# Patient Record
Sex: Male | Born: 1945 | Race: White | Hispanic: No | Marital: Married | Smoking: Former smoker
Health system: Southern US, Community
[De-identification: ages and names within clinical notes are randomized; demographics above are authoritative.]

## PROBLEM LIST (undated history)

## (undated) DIAGNOSIS — G453 Amaurosis fugax: Secondary | ICD-10-CM

## (undated) DIAGNOSIS — H9190 Unspecified hearing loss, unspecified ear: Secondary | ICD-10-CM

## (undated) DIAGNOSIS — Z9889 Other specified postprocedural states: Secondary | ICD-10-CM

## (undated) DIAGNOSIS — Z923 Personal history of irradiation: Secondary | ICD-10-CM

## (undated) DIAGNOSIS — M1990 Unspecified osteoarthritis, unspecified site: Secondary | ICD-10-CM

## (undated) DIAGNOSIS — C801 Malignant (primary) neoplasm, unspecified: Secondary | ICD-10-CM

## (undated) DIAGNOSIS — IMO0002 Reserved for concepts with insufficient information to code with codable children: Secondary | ICD-10-CM

## (undated) DIAGNOSIS — K219 Gastro-esophageal reflux disease without esophagitis: Secondary | ICD-10-CM

## (undated) DIAGNOSIS — F419 Anxiety disorder, unspecified: Secondary | ICD-10-CM

## (undated) DIAGNOSIS — Z85828 Personal history of other malignant neoplasm of skin: Secondary | ICD-10-CM

## (undated) DIAGNOSIS — IMO0001 Reserved for inherently not codable concepts without codable children: Secondary | ICD-10-CM

## (undated) DIAGNOSIS — H269 Unspecified cataract: Secondary | ICD-10-CM

## (undated) DIAGNOSIS — G4730 Sleep apnea, unspecified: Secondary | ICD-10-CM

## (undated) DIAGNOSIS — T7840XA Allergy, unspecified, initial encounter: Secondary | ICD-10-CM

## (undated) DIAGNOSIS — G709 Myoneural disorder, unspecified: Secondary | ICD-10-CM

## (undated) DIAGNOSIS — E785 Hyperlipidemia, unspecified: Secondary | ICD-10-CM

## (undated) DIAGNOSIS — K635 Polyp of colon: Secondary | ICD-10-CM

## (undated) DIAGNOSIS — I493 Ventricular premature depolarization: Secondary | ICD-10-CM

## (undated) DIAGNOSIS — N400 Enlarged prostate without lower urinary tract symptoms: Secondary | ICD-10-CM

## (undated) DIAGNOSIS — R519 Headache, unspecified: Secondary | ICD-10-CM

## (undated) HISTORY — PX: RADIAL KERATOTOMY: SHX217

## (undated) HISTORY — DX: Reserved for inherently not codable concepts without codable children: IMO0001

## (undated) HISTORY — PX: TONSILLECTOMY: SUR1361

## (undated) HISTORY — PX: CERVICAL SPINE SURGERY: SHX589

## (undated) HISTORY — PX: EYE SURGERY: SHX253

## (undated) HISTORY — PX: FINGER SURGERY: SHX640

## (undated) HISTORY — DX: Anxiety disorder, unspecified: F41.9

## (undated) HISTORY — PX: MOHS SURGERY: SUR867

## (undated) HISTORY — DX: Malignant (primary) neoplasm, unspecified: C80.1

## (undated) HISTORY — DX: Polyp of colon: K63.5

## (undated) HISTORY — DX: Hyperlipidemia, unspecified: E78.5

## (undated) HISTORY — DX: Allergy, unspecified, initial encounter: T78.40XA

## (undated) HISTORY — DX: Gastro-esophageal reflux disease without esophagitis: K21.9

## (undated) HISTORY — PX: OTHER SURGICAL HISTORY: SHX169

## (undated) HISTORY — DX: Personal history of irradiation: Z92.3

## (undated) HISTORY — DX: Reserved for concepts with insufficient information to code with codable children: IMO0002

## (undated) HISTORY — PX: COLONOSCOPY W/ BIOPSIES AND POLYPECTOMY: SHX1376

## (undated) HISTORY — PX: NASAL SINUS SURGERY: SHX719

---

## 1989-06-20 HISTORY — PX: NASAL SINUS SURGERY: SHX719

## 2001-10-20 DIAGNOSIS — K635 Polyp of colon: Secondary | ICD-10-CM

## 2001-10-20 HISTORY — DX: Polyp of colon: K63.5

## 2001-11-04 ENCOUNTER — Encounter (INDEPENDENT_AMBULATORY_CARE_PROVIDER_SITE_OTHER): Payer: Self-pay | Admitting: *Deleted

## 2001-11-04 ENCOUNTER — Ambulatory Visit (HOSPITAL_COMMUNITY): Admission: RE | Admit: 2001-11-04 | Discharge: 2001-11-04 | Payer: Self-pay | Admitting: Gastroenterology

## 2005-03-20 HISTORY — PX: INGUINAL HERNIA REPAIR: SHX194

## 2005-04-01 ENCOUNTER — Ambulatory Visit (HOSPITAL_COMMUNITY): Admission: RE | Admit: 2005-04-01 | Discharge: 2005-04-01 | Payer: Self-pay | Admitting: Surgery

## 2005-04-01 ENCOUNTER — Ambulatory Visit (HOSPITAL_BASED_OUTPATIENT_CLINIC_OR_DEPARTMENT_OTHER): Admission: RE | Admit: 2005-04-01 | Discharge: 2005-04-01 | Payer: Self-pay | Admitting: Surgery

## 2008-06-23 DIAGNOSIS — C320 Malignant neoplasm of glottis: Secondary | ICD-10-CM | POA: Insufficient documentation

## 2008-06-23 DIAGNOSIS — C801 Malignant (primary) neoplasm, unspecified: Secondary | ICD-10-CM

## 2008-06-23 HISTORY — DX: Malignant (primary) neoplasm, unspecified: C80.1

## 2008-07-12 ENCOUNTER — Ambulatory Visit: Payer: Self-pay | Admitting: Oncology

## 2008-07-14 LAB — COMPREHENSIVE METABOLIC PANEL
ALT: 14 U/L (ref 0–53)
AST: 16 U/L (ref 0–37)
Albumin: 4.1 g/dL (ref 3.5–5.2)
Alkaline Phosphatase: 63 U/L (ref 39–117)
BUN: 10 mg/dL (ref 6–23)
CO2: 23 mEq/L (ref 19–32)
Calcium: 8.8 mg/dL (ref 8.4–10.5)
Chloride: 106 mEq/L (ref 96–112)
Creatinine, Ser: 1.03 mg/dL (ref 0.40–1.50)
Glucose, Bld: 118 mg/dL — ABNORMAL HIGH (ref 70–99)
Potassium: 3.8 mEq/L (ref 3.5–5.3)
Sodium: 137 mEq/L (ref 135–145)
Total Bilirubin: 0.5 mg/dL (ref 0.3–1.2)
Total Protein: 6.2 g/dL (ref 6.0–8.3)

## 2008-07-14 LAB — CBC WITH DIFFERENTIAL/PLATELET
BASO%: 0.6 % (ref 0.0–2.0)
Basophils Absolute: 0 10*3/uL (ref 0.0–0.1)
EOS%: 1.5 % (ref 0.0–7.0)
Eosinophils Absolute: 0.1 10*3/uL (ref 0.0–0.5)
HCT: 42.2 % (ref 38.7–49.9)
HGB: 14.7 g/dL (ref 13.0–17.1)
LYMPH%: 38.3 % (ref 14.0–48.0)
MCH: 31.5 pg (ref 28.0–33.4)
MCHC: 34.8 g/dL (ref 32.0–35.9)
MCV: 90.4 fL (ref 81.6–98.0)
MONO#: 0.5 10*3/uL (ref 0.1–0.9)
MONO%: 8.6 % (ref 0.0–13.0)
NEUT#: 2.9 10*3/uL (ref 1.5–6.5)
NEUT%: 51 % (ref 40.0–75.0)
Platelets: 189 10*3/uL (ref 145–400)
RBC: 4.67 10*6/uL (ref 4.20–5.71)
RDW: 13 % (ref 11.2–14.6)
WBC: 5.6 10*3/uL (ref 4.0–10.0)
lymph#: 2.2 10*3/uL (ref 0.9–3.3)

## 2008-07-14 LAB — LACTATE DEHYDROGENASE: LDH: 149 U/L (ref 94–250)

## 2008-07-21 ENCOUNTER — Ambulatory Visit: Admission: RE | Admit: 2008-07-21 | Discharge: 2008-09-26 | Payer: Self-pay | Admitting: Radiation Oncology

## 2008-07-24 ENCOUNTER — Ambulatory Visit (HOSPITAL_COMMUNITY): Admission: RE | Admit: 2008-07-24 | Discharge: 2008-07-24 | Payer: Self-pay | Admitting: Radiation Oncology

## 2008-07-24 LAB — BUN: BUN: 8 mg/dL (ref 6–23)

## 2008-07-24 LAB — CREATININE, SERUM: Creatinine, Ser: 0.8 mg/dL (ref 0.40–1.50)

## 2008-07-27 ENCOUNTER — Ambulatory Visit (HOSPITAL_COMMUNITY): Admission: RE | Admit: 2008-07-27 | Discharge: 2008-07-27 | Payer: Self-pay | Admitting: Radiation Oncology

## 2008-08-08 LAB — CBC WITH DIFFERENTIAL/PLATELET
BASO%: 0.9 % (ref 0.0–2.0)
Basophils Absolute: 0.1 10*3/uL (ref 0.0–0.1)
EOS%: 1.1 % (ref 0.0–7.0)
Eosinophils Absolute: 0.1 10*3/uL (ref 0.0–0.5)
HCT: 45.2 % (ref 38.7–49.9)
HGB: 15.7 g/dL (ref 13.0–17.1)
LYMPH%: 34.8 % (ref 14.0–48.0)
MCH: 31.4 pg (ref 28.0–33.4)
MCHC: 34.7 g/dL (ref 32.0–35.9)
MCV: 90.5 fL (ref 81.6–98.0)
MONO#: 0.6 10*3/uL (ref 0.1–0.9)
MONO%: 8.1 % (ref 0.0–13.0)
NEUT#: 3.7 10*3/uL (ref 1.5–6.5)
NEUT%: 55.1 % (ref 40.0–75.0)
Platelets: 196 10*3/uL (ref 145–400)
RBC: 5 10*6/uL (ref 4.20–5.71)
RDW: 12.9 % (ref 11.2–14.6)
WBC: 6.8 10*3/uL (ref 4.0–10.0)
lymph#: 2.4 10*3/uL (ref 0.9–3.3)

## 2008-08-08 LAB — COMPREHENSIVE METABOLIC PANEL
ALT: 17 U/L (ref 0–53)
AST: 16 U/L (ref 0–37)
Albumin: 4.2 g/dL (ref 3.5–5.2)
Alkaline Phosphatase: 69 U/L (ref 39–117)
BUN: 9 mg/dL (ref 6–23)
CO2: 23 mEq/L (ref 19–32)
Calcium: 9.6 mg/dL (ref 8.4–10.5)
Chloride: 107 mEq/L (ref 96–112)
Creatinine, Ser: 0.89 mg/dL (ref 0.40–1.50)
Glucose, Bld: 109 mg/dL — ABNORMAL HIGH (ref 70–99)
Potassium: 3.8 mEq/L (ref 3.5–5.3)
Sodium: 140 mEq/L (ref 135–145)
Total Bilirubin: 0.8 mg/dL (ref 0.3–1.2)
Total Protein: 6.7 g/dL (ref 6.0–8.3)

## 2008-08-08 LAB — LACTATE DEHYDROGENASE: LDH: 142 U/L (ref 94–250)

## 2009-02-16 ENCOUNTER — Encounter: Admission: RE | Admit: 2009-02-16 | Discharge: 2009-02-16 | Payer: Self-pay | Admitting: Chiropractic Medicine

## 2009-04-11 ENCOUNTER — Encounter: Admission: RE | Admit: 2009-04-11 | Discharge: 2009-04-11 | Payer: Self-pay | Admitting: Neurological Surgery

## 2009-06-14 ENCOUNTER — Ambulatory Visit (HOSPITAL_COMMUNITY): Admission: RE | Admit: 2009-06-14 | Discharge: 2009-06-14 | Payer: Self-pay | Admitting: Neurological Surgery

## 2009-07-10 ENCOUNTER — Encounter: Admission: RE | Admit: 2009-07-10 | Discharge: 2009-07-10 | Payer: Self-pay | Admitting: Neurological Surgery

## 2009-08-27 ENCOUNTER — Encounter: Admission: RE | Admit: 2009-08-27 | Discharge: 2009-08-27 | Payer: Self-pay | Admitting: Neurological Surgery

## 2009-10-03 ENCOUNTER — Ambulatory Visit: Admission: RE | Admit: 2009-10-03 | Discharge: 2009-10-03 | Payer: Self-pay | Admitting: Radiation Oncology

## 2009-10-03 LAB — TSH: TSH: 1.565 u[IU]/mL (ref 0.350–4.500)

## 2010-10-03 ENCOUNTER — Ambulatory Visit
Admission: RE | Admit: 2010-10-03 | Discharge: 2010-10-03 | Payer: Self-pay | Source: Home / Self Care | Attending: Radiation Oncology | Admitting: Radiation Oncology

## 2010-10-03 LAB — TSH: TSH: 1.392 u[IU]/mL (ref 0.350–4.500)

## 2011-01-25 LAB — BASIC METABOLIC PANEL
BUN: 8 mg/dL (ref 6–23)
CO2: 31 mEq/L (ref 19–32)
Calcium: 9.2 mg/dL (ref 8.4–10.5)
Chloride: 104 mEq/L (ref 96–112)
Creatinine, Ser: 0.81 mg/dL (ref 0.4–1.5)
GFR calc Af Amer: >60 mL/min (ref >60)
GFR calc non Af Amer: >60 mL/min (ref >60)
Glucose, Bld: 105 mg/dL — ABNORMAL HIGH (ref 70–99)
Potassium: 4.1 mEq/L (ref 3.5–5.1)
Sodium: 140 mEq/L (ref 135–145)

## 2011-01-25 LAB — CBC
HCT: 43.7 % (ref 39.0–52.0)
Hemoglobin: 14.9 g/dL (ref 13.0–17.0)
MCHC: 34 g/dL (ref 30.0–36.0)
MCV: 92.8 fL (ref 78.0–100.0)
Platelets: 168 10*3/uL (ref 150–400)
RBC: 4.72 MIL/uL (ref 4.22–5.81)
RDW: 13.1 % (ref 11.5–15.5)
WBC: 6.1 10*3/uL (ref 4.0–10.5)

## 2011-01-25 LAB — PROTIME-INR
INR: 1 (ref 0.00–1.49)
Prothrombin Time: 13.3 seconds (ref 11.6–15.2)

## 2011-01-25 LAB — DIFFERENTIAL
Basophils Absolute: 0 10*3/uL (ref 0.0–0.1)
Basophils Relative: 1 % (ref 0–1)
Eosinophils Absolute: 0.1 10*3/uL (ref 0.0–0.7)
Eosinophils Relative: 1 % (ref 0–5)
Lymphocytes Relative: 31 % (ref 12–46)
Lymphs Abs: 1.9 10*3/uL (ref 0.7–4.0)
Monocytes Absolute: 0.7 10*3/uL (ref 0.1–1.0)
Monocytes Relative: 12 % (ref 3–12)
Neutro Abs: 3.3 10*3/uL (ref 1.7–7.7)
Neutrophils Relative %: 55 % (ref 43–77)

## 2011-01-25 LAB — APTT: aPTT: 27 seconds (ref 24–37)

## 2011-03-04 NOTE — Op Note (Signed)
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   PROCEDURES:  1. Decompressive anterior cervical diskectomy C6-7.  2. Anterior cervical arthrodesis C6-7 utilizing a 7-mm PEEK interbody      cage packed with local autograft and Actifuse putty.  3. Anterior cervical plating C6-7 utilizing a 25-mm Atlantis Venture      plate.   SURGEON:  Tia Alert, MD   ASSISTANT:  Reinaldo Meeker, MD   ANESTHESIA:  General endotracheal.   COMPLICATIONS:  None apparent.   INDICATIONS FOR PROCEDURE:  Mr. Bertino is a 65 year old gentleman who  presented with neck and left arm pain.  He had an MRI, which showed a  large disk herniation at C6-7 with cord compression and mild signal  change in the cord.  I recommended decompressive anterior cervical  diskectomy and fusion with plating at C6-7.  He understood the risks,  benefits, expected outcome, and wished to proceed.   DESCRIPTION OF PROCEDURE:  The patient was taken to the operating room  and after induction of adequate generalized endotracheal anesthesia, he  was placed in a supine position on the operating room table.  His right  anterior cervical region was prepped with DuraPrep and then draped in  the usual sterile fashion.  A 5 mL of local anesthesia was injected and  a transverse incision was made to the right of midline and carried down  to the platysma.  The platysma was elevated, opened and undermined with  Metzenbaum scissors.  I then dissected  in a plane medial to the  sternocleidomastoid muscle and internal carotid artery and lateral to  the trachea and esophagus to expose C6-7.  Intraoperative fluoroscopy  confirmed my level and then the longus colli muscles were taken down and  the Shadow-Line retractors were placed under this to expose.  The  annulus was incised and initial diskectomies done with pituitary  rongeurs and curved curettes.  We then used the high-speed drill to  drill the endplates down to the level of the posterior longitudinal  ligament.  The drill shavings were saved in a Mucus Trap to use for  later arthrodesis.  The operating microscope was brought into the field  and we undercut the vertebral bodies of C6 and C7, opened the posterior  longitudinal ligament and removed and undercutting the bodies even  further.  There was a large herniated disk at C6-7 on the left, this was  removed.  There was a piece that went under the C6 vertebral body.  This  was swept out with a nerve hook.  I continued to decompress  laterally  until I identified the C7 nerve root and the pedicle on the left,  marched along the pedicle and decompressed the C7 nerve root distally  into its foramen.  I then palpated circumferentially with a nerve hook  to assure adequate decompression.  The dura was full all the way across.  We can see the cord pulsatile through the dura.  We felt like we had an  excellent decompression and the disk was removed.  We palpated once  again to assure this using a nerve hook.  We then measured the  interspace to be 6 mm; therefore, we got a 7-mm PEEK interbody cage to  get a little bit of distraction, and we packed this with local autograft  and Actifuse putty and tapped this into position at C6-7.  We then used  a 25-mm Atlantis Venture plate and placed two 13-mm variable angle  screws in the bodies of C6 and C7.  These locked into plate by locking  mechanism within the plate.  We then irrigated with saline  solution  containing bacitracin, dried all bleeding points.  Once meticulous  hemostasis was achieved, I closed the platysma with 3-0 Vicryl, closed  the subcuticular tissue with 3-0 Vicryl, and closed the skin with  benzoin and Steri-Strips.  The drapes were removed.  A sterile dressing  was applied.  The patient was awakened from general anesthesia and  transported to the recovery room in stable condition.  At the end of  procedure, all sponge, needle, and instrument counts were correct.      Tia Alert, MD  Electronically Signed     DSJ/MEDQ  D:  06/14/2009  T:  06/15/2009  Job:  [id number]

## 2011-03-07 NOTE — Procedures (Signed)
[place]  Patient:    JOESEPH, VERVILLE Visit Number: [id number] MRN: [id number]          Service Type: END Location: ENDO Attending Physician:  Charna Elizabeth Dictated by:   Anselmo Rod, M.D. Proc. Date: 11/04/01 Admit Date:  11/04/2001   CC:         Lilly Cove, M.D.   Procedure Report  DATE OF BIRTH:  [birth year]  REFERRING PHYSICIAN:  Lilly Cove, M.D.  PROCEDURE PERFORMED:  Colonoscopy with snare polypectomy x 2.  ENDOSCOPIST:  Anselmo Rod, M.D.  INSTRUMENT USED:  Olympus video colonoscope.  INDICATIONS FOR PROCEDURE:  Blood in stool in a 65 year old white male.  Rule out colonic polyps, masses, hemorrhoids, etc.  PREPROCEDURE PREPARATION:  Informed consent was procured from the patient. The patient was fasted for eight hours prior to the procedure and prepped with a bottle of magnesium citrate and a gallon of NuLytely the night prior to the procedure.  PREPROCEDURE PHYSICAL:  The patient had stable vital signs.  Neck supple. Chest clear to auscultation.  S1, S2 regular.  Abdomen soft with normal abdominal bowel sounds.  DESCRIPTION OF PROCEDURE:  The patient was placed in the left lateral decubitus position and sedated with 75 mg of Demerol and 6 mg of Versed intravenously.  Once the patient was adequately sedated and maintained on low-flow oxygen and continuous cardiac monitoring, the Olympus video colonoscope was advanced from the rectum to the cecum without difficulty.  The patient had a fairly good prep.  A 6 to 7 mm broad based polyp was snared from 15 cm.  Another smaller sessile polyp was snared from 15 cm as well.  Both the polyps were retrieved.  There was evidence of small internal hemorrhoids on retroflexion.  The rest of the colonic mucosa including the cecum, right colon, transverse colon appeared normal and without lesions.  IMPRESSION: 1. Small nonbleeding internal hemorrhoids. 2. Early  left-sided diverticulosis. 3. Two polyps snared from 15 cm. 4. Normal-appearing transverse and right colon.  RECOMMENDATIONS: 1. Await pathology results. 2. Avoid all nonsteroidals including aspirin over the next three weeks. 3. High fiber diet. 4. Repeat colorectal cancer screening depending on the pathology results.Dictated by:   Anselmo Rod, M.D. Attending Physician:  Charna Elizabeth DD:  11/04/01 TD:  11/04/01 Job: [id number]

## 2011-03-07 NOTE — Op Note (Signed)
NAMEJASYAH, THEURER              ACCOUNT NO.:  [id number]   MEDICAL RECORD NO.:  [id number]          PATIENT TYPE:  AMB   LOCATION:  DSC                          FACILITY:  [facility]   PHYSICIAN:  Thornton Park. Daphine Deutscher, MD  DATE OF BIRTH:  September 05, 1946   DATE OF PROCEDURE:  04/01/2005  DATE OF DISCHARGE:                                 OPERATIVE REPORT   PREOPERATIVE DIAGNOSIS:  Left inguinal hernia.   POSTOPERATIVE DIAGNOSIS:  Left indirect inguinal hernia.   PROCEDURE:  Left inguinal herniorrhaphy with mesh.   SURGEON:  Thornton Park. Daphine Deutscher, M.D.   ANESTHESIA:  General endotracheal anesthesia.   DESCRIPTION OF PROCEDURE:  Mr. Beamer is a 65 year old gentleman with a  newly diagnosed left inguinal hernia.  Informed consent was obtained  regarding recurrence, nerve pain, and numbness.  He was given a booklet on  the repair with these and other complications, risks, and information about  the procedure explained to him.  After painting with Betadine, a small  oblique incision was made and carried down over the inguinal canal.  I  opened the inguinal canal with Metzenbaum scissors avoiding the ilioinguinal  nerve which I separated and placed up with the medial flap of external  oblique.  I mobilized the cord and found an indirect hernia sac with fat and  another lipoma of the cord which I mobilized both from the cord structures  and then I reduced up inside the abdomen.  I closed the ring with two  sutures of 2-0 Prolene.  A pre-cut piece of mesh was sutured to the inguinal  ligament with running 2-0 Prolene.  I brought it around the cord structures  and then sutured it medially with a running 2-0 Prolene and then sutured it  to itself with a single horizontal mattress suture of 2-0 Prolene.   The external oblique was reapproximated with interrupted 2-0 Vicryl.  4-0  Vicryl was used in the subcutaneous tissue.  I injected the area with 0.25%  Marcaine.  I closed it in layers with 4-0 Vicryl  with Benzoin and Steri-  Strips on the skin.  The patient was given Tylox for pain and will be  followed up in the office in three weeks.       MBM/MEDQ  D:  04/01/2005  T:  04/01/2005  Job:  [id number]   cc:   Gaspar Garbe, M.D.  [address]  [place]  [place] [address]  Fax: [phone]   Lucrezia Starch. Earlene Plater, M.D.  [address]  [place]  [place] [address]  Fax: [phone]

## 2011-04-24 ENCOUNTER — Ambulatory Visit
Admission: RE | Admit: 2011-04-24 | Discharge: 2011-04-24 | Disposition: A | Discharge status: Home / Self Care | Payer: Medicare Other | Source: Ambulatory Visit | Attending: Radiation Oncology | Admitting: Radiation Oncology

## 2011-07-22 LAB — GLUCOSE, CAPILLARY: Glucose-Capillary: 93

## 2011-10-29 ENCOUNTER — Ambulatory Visit: Payer: Medicare Other | Admitting: Radiation Oncology

## 2011-11-04 ENCOUNTER — Encounter: Payer: Self-pay | Admitting: *Deleted

## 2011-11-04 DIAGNOSIS — E785 Hyperlipidemia, unspecified: Secondary | ICD-10-CM | POA: Insufficient documentation

## 2011-11-04 DIAGNOSIS — K219 Gastro-esophageal reflux disease without esophagitis: Secondary | ICD-10-CM | POA: Insufficient documentation

## 2011-11-06 ENCOUNTER — Ambulatory Visit
Admission: RE | Admit: 2011-11-06 | Discharge: 2011-11-06 | Disposition: A | Discharge status: Home / Self Care | Payer: Medicare Other | Source: Ambulatory Visit | Attending: Radiation Oncology | Admitting: Radiation Oncology

## 2011-11-06 ENCOUNTER — Ambulatory Visit (HOSPITAL_COMMUNITY)
Admission: RE | Admit: 2011-11-06 | Discharge: 2011-11-06 | Disposition: A | Discharge status: Home / Self Care | Payer: Medicare Other | Source: Ambulatory Visit | Attending: Radiation Oncology | Admitting: Radiation Oncology

## 2011-11-06 ENCOUNTER — Encounter: Payer: Self-pay | Admitting: Radiation Oncology

## 2011-11-06 DIAGNOSIS — T66XXXA Radiation sickness, unspecified, initial encounter: Secondary | ICD-10-CM

## 2011-11-06 DIAGNOSIS — C320 Malignant neoplasm of glottis: Secondary | ICD-10-CM | POA: Insufficient documentation

## 2011-11-06 DIAGNOSIS — Y842 Radiological procedure and radiotherapy as the cause of abnormal reaction of the patient, or of later complication, without mention of misadventure at the time of the procedure: Secondary | ICD-10-CM | POA: Insufficient documentation

## 2011-11-06 DIAGNOSIS — C801 Malignant (primary) neoplasm, unspecified: Secondary | ICD-10-CM

## 2011-11-06 DIAGNOSIS — C329 Malignant neoplasm of larynx, unspecified: Secondary | ICD-10-CM | POA: Insufficient documentation

## 2011-11-06 LAB — TSH: TSH: 2.418 u[IU]/mL (ref 0.350–4.500)

## 2011-11-06 NOTE — Progress Notes (Signed)
Patient presented to the clinic today accompanied by his wife for a follow up appointment with Dr. Kathrynn Running. Patient is alert and oriented to person, place, and time. No distress noted. Steady gait noted. Pleasant affect noted. Patient denies pain at this time. Patient has no complaints. Patient seen by Dr. Kathrynn Running. Directed patient to lab to have his TSH drawn then, onto radiology for a chest xray. Patient and his wife verbalized understanding.

## 2011-11-07 ENCOUNTER — Encounter: Payer: Self-pay | Admitting: Radiation Oncology

## 2011-11-07 NOTE — Progress Notes (Signed)
  Radiation Oncology         [phone] ________________________________  Name: Peter Drake MRN: [id number]  Date: 11/07/2011  DOB: 02/13/1946       Follow-Up Visit Note  CC: No primary provider on file.  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 . . . . . . . The patient returns today for routine follow-up.  He does suffer with a gravelly voice in the morning. Otherwise, he notes no voice changes. He denies otalgia. He has been experiencing some challenges with earwax as well as otitis media and he follows with Dr. Narda Bonds for this. The patient's most recent TSH level was drawn in December 2011 and he has not had recent chest x-ray.                              ALLERGIES:  is allergic to penicillins.  Meds. . . . . . . . . . . . Current Outpatient Prescriptions  Medication Sig Dispense Refill  . simvastatin (ZOCOR) 20 MG tablet Take 20 mg by mouth daily.      . Tamsulosin HCl (FLOMAX) 0.4 MG CAPS Take by mouth.      Marland Kitchen aspirin 325 MG EC tablet Take 325 mg by mouth daily.      Marland Kitchen esomeprazole (NEXIUM) 40 MG capsule Take 40 mg by mouth daily before breakfast.        Physical Findings. . .  oral temperature is 98.1 F (36.7 C). His blood pressure is 130/87 and his pulse is 65. His respiration is 18. .  The patient is in no acute distress today. He is alert and oriented. His neck is free of any appreciable lymphadenopathy. The oral cavity contains good dentition with no mucosal abnormalities. Indirect mirror exam elicit a gag reflex. Following local anesthesia comment are clear exam provided some visualization of the supraglottic larynx and vocal cords. No apparent evidence for persistent or recurrent disease was noted.  Lab Findings . . . . .  Lab Results  Component Value Date   WBC 6.1 06/12/2009   HGB 14.9 06/12/2009   HCT 43.7  06/12/2009   MCV 92.8 06/12/2009   PLT 168 06/12/2009    Impression . . . . . . . The patient is recovering from the effects of radiation.  He is no evidence of recurrent disease. He is due for her annual TSH and chest imaging.  Plan . . . . . . . . . . . Marland Kitchen the patient will undergo TSH and chest x-ray today. He'll return for routine followup in one year  _____________________________________  Artist Pais. Kathrynn Running, M.D.    Addendum:  Chest x-ray and TSH are normal  X-Ray Findings. . . . Dg Chest 2 View  11/06/2011  *RADIOLOGY REPORT*  Clinical Data: Laryngeal cancer and restaging.  CHEST - 2 VIEW  Comparison: 06/12/2009  Findings: The heart size and mediastinal contours are within normal limits.  Both lungs are clear.  The visualized skeletal structures are unremarkable.  IMPRESSION: Negative exam.  Original Report Authenticated By: Rosealee Albee, M.D.   TSH  Date Value Range Status  11/06/2011 2.418  0.350-4.500 (uIU/mL) Final

## 2011-11-07 NOTE — Progress Notes (Signed)
Quick Note:  Please call patient with normal result.  Thanks. MM ______ 

## 2011-11-12 ENCOUNTER — Telehealth: Payer: Self-pay | Admitting: *Deleted

## 2011-11-12 NOTE — Telephone Encounter (Signed)
Pt called this am, informed of TSH normal and Cxray normal, pt thanked staff for the news and will see MD 6 months 8:31 AM

## 2012-03-13 ENCOUNTER — Encounter (HOSPITAL_COMMUNITY): Payer: Self-pay | Admitting: *Deleted

## 2012-03-13 ENCOUNTER — Emergency Department (HOSPITAL_COMMUNITY)
Admission: EM | Admit: 2012-03-13 | Discharge: 2012-03-13 | Disposition: A | Discharge status: Home / Self Care | Payer: Medicare Other | Attending: Emergency Medicine | Admitting: Emergency Medicine

## 2012-03-13 DIAGNOSIS — Z23 Encounter for immunization: Secondary | ICD-10-CM | POA: Insufficient documentation

## 2012-03-13 DIAGNOSIS — K219 Gastro-esophageal reflux disease without esophagitis: Secondary | ICD-10-CM | POA: Insufficient documentation

## 2012-03-13 DIAGNOSIS — E785 Hyperlipidemia, unspecified: Secondary | ICD-10-CM | POA: Insufficient documentation

## 2012-03-13 DIAGNOSIS — W268XXA Contact with other sharp object(s), not elsewhere classified, initial encounter: Secondary | ICD-10-CM | POA: Insufficient documentation

## 2012-03-13 DIAGNOSIS — S51809A Unspecified open wound of unspecified forearm, initial encounter: Secondary | ICD-10-CM | POA: Insufficient documentation

## 2012-03-13 DIAGNOSIS — Z87891 Personal history of nicotine dependence: Secondary | ICD-10-CM | POA: Insufficient documentation

## 2012-03-13 DIAGNOSIS — Z8521 Personal history of malignant neoplasm of larynx: Secondary | ICD-10-CM | POA: Insufficient documentation

## 2012-03-13 DIAGNOSIS — S51831A Puncture wound without foreign body of right forearm, initial encounter: Secondary | ICD-10-CM

## 2012-03-13 MED ORDER — CEPHALEXIN 250 MG PO CAPS
500.0000 mg | ORAL_CAPSULE | Freq: Once | ORAL | Status: AC
  Administered 2012-03-13: 500 mg via ORAL
  Filled 2012-03-13: qty 2

## 2012-03-13 MED ORDER — TETANUS-DIPHTH-ACELL PERTUSSIS 5-2.5-18.5 LF-MCG/0.5 IM SUSP
INTRAMUSCULAR | Status: AC
  Filled 2012-03-13: qty 0.5

## 2012-03-13 MED ORDER — TETANUS-DIPHTH-ACELL PERTUSSIS 5-2-15.5 LF-MCG/0.5 IM SUSP: 0.5000 mL | Freq: Once | INTRAMUSCULAR | Status: DC

## 2012-03-13 MED ORDER — CEPHALEXIN 500 MG PO CAPS: 500.0000 mg | ORAL_CAPSULE | Freq: Four times a day (QID) | ORAL | Status: AC

## 2012-03-13 MED ORDER — TETANUS-DIPHTH-ACELL PERTUSSIS 5-2.5-18.5 LF-MCG/0.5 IM SUSP
0.5000 mL | Freq: Once | INTRAMUSCULAR | Status: AC
  Administered 2012-03-13: 0.5 mL via INTRAMUSCULAR

## 2012-03-13 NOTE — ED Provider Notes (Signed)
History   This chart was scribed for Lyanne Co, MD by Melba Coon. The patient was seen in room STRE5/STRE5 and the patient's care was started at 2:56PM.    CSN: [id number]  Arrival date & time 03/13/12  1403   First MD Initiated Contact with Patient 03/13/12 1452      Chief Complaint  Patient presents with  . Arm Injury    (Consider location/radiation/quality/duration/timing/severity/associated sxs/prior treatment) HPI Peter Drake is a 66 y.o. male who presents to the Emergency Department complaining of constant, moderate to severe right forearm pain pertaining to a laceration with an onset today. Pt was helping his friend cut a tree limb; when pt helped his friend turn over the limb, a piece wood became lodged into his forearm. No pain meds have been taken at home. No HA, fever, neck pain, sore throat, rash, back pain, CP, SOB, abd pain, n/v/d, dysuria, or extremity edema, weakness, numbness, or tingling. Allergic to penicillins. No other pertinent medical symptoms.   Past Medical History  Diagnosis Date  . 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 Dyslipidemia   . History of radiation therapy 08/04/08-09/20/08    larynx  . GERD (gastroesophageal reflux disease)   . Allergy     pcn  . Colorectal polyps 1/03  . Anxiety     Past Surgical History  Procedure Date  . Tonsillectomy     as a child  . Left knee arthoscopy     x 2  . Inguinal hernia repair  03/2005    left   . Finger surgery     right fifth  . Radial keratotomy     20 years ago/ bi lateral  . Nasal sinus surgery 1990's  . Basal cell excisions     by dr. hall  . Cervical spine surgery     Family History  Problem Relation Age of Onset  . Rectal cancer Sister   . Melanoma Daughter 24    treated    History  Substance Use Topics  . Smoking status: Former Smoker -- 15 years    Types: Cigarettes  . Smokeless tobacco: Former Neurosurgeon   Comment: quit smoking 1980,then smokless tobaco until  1996  . 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   Allergies  Penicillins  Home Medications   Current Outpatient Rx  Name Route Sig Dispense Refill  . ASPIRIN 325 MG PO TBEC Oral Take 325 mg by mouth daily as needed. pain    . MOMETASONE FUROATE 50 MCG/ACT NA SUSP Nasal Place 2 sprays into the nose daily as needed. congestion    . SIMVASTATIN 20 MG PO TABS Oral Take 20 mg by mouth daily.    Marland Kitchen TAMSULOSIN HCL 0.4 MG PO CAPS Oral Take 0.4 mg by mouth daily.       BP 106/79  Pulse 72  Temp(Src) 98.1 F (36.7 C) (Oral)  Resp 20  SpO2 99%  Physical Exam  Nursing note and vitals reviewed. Constitutional: He is oriented to person, place, and time. He appears well-developed and well-nourished. No distress.  HENT:  Head: Normocephalic and atraumatic.  Eyes: EOM are normal.  Neck: Neck supple. No tracheal deviation present.  Cardiovascular: Normal rate.   Pulmonary/Chest: Effort normal. No respiratory distress.  Musculoskeletal: Normal range  of motion.  Neurological: He is alert and oriented to person, place, and time.  Skin: Skin is warm and dry.       Puncture wound on rt anterior mid forearm with piece of wood stuck into wound  Psychiatric: He has a normal mood and affect. His behavior is normal.    ED Course  FOREIGN BODY REMOVAL Performed by: Lyanne Co Authorized by: Lyanne Co Consent: Verbal consent obtained. Consent given by: patient Patient identity confirmed: verbally with patient Intake: Right anterior forearm. Anesthesia method: None. Local anesthetic: None. Patient cooperative: yes Complexity: simple 1 objects recovered. Objects recovered: Wood fragment Post-procedure assessment: foreign body removed Patient tolerance: Patient tolerated the procedure well with no immediate complications. Comments: Irrigated extensively at  bedside   (including critical care time)  DIAGNOSTIC STUDIES: Oxygen Saturation is 99% on room air, normal by my interpretation.    COORDINATION OF CARE:  2:59PM - EDMD removed small wet fragment from the forearm; pt will need a tetanus shot 3:04PM - EDMD will Rx abx for the pt; tetanus shot given; pt ready for d/c    Labs Reviewed - No data to display No results found.   1. Puncture wound of right forearm       MDM  Foreign body removed.  Infection warnings given.  Tetanus updated.  We'll place the patient on antibiotics.  He understands to return to the emergency department for signs of infection that we discussed  I personally performed the services described in this documentation, which was scribed in my presence. The recorded information has been reviewed and considered.          Lyanne Co, MD 03/13/12 [phone]

## 2012-03-13 NOTE — Discharge Instructions (Signed)
Puncture Wound  A puncture wound is an injury that extends through all layers of the skin and into the tissue beneath the skin (subcutaneous tissue). Puncture wounds become infected easily because germs often enter the body and go beneath the skin during the injury. Having a deep wound with a small entrance point makes it difficult for your caregiver to adequately clean the wound. This is especially true if you have stepped on a nail and it has passed through a dirty shoe or other situations where the wound is obviously contaminated.  CAUSES   Many puncture wounds involve glass, nails, splinters, fish hooks, or other objects that enter the skin (foreign bodies). A puncture wound may also be caused by a human bite or animal bite.  DIAGNOSIS   A puncture wound is usually diagnosed by your history and a physical exam. You may need to have an X-ray or an ultrasound to check for any foreign bodies still in the wound.  TREATMENT    Your caregiver will clean the wound as thoroughly as possible. Depending on the location of the wound, a bandage (dressing) may be applied.   Your caregiver might prescribe antibiotic medicines.   You may need a follow-up visit to check on your wound. Follow all instructions as directed by your caregiver.  HOME CARE INSTRUCTIONS    Change your dressing once per day, or as directed by your caregiver. If the dressing sticks, it may be removed by soaking the area in water.   If your caregiver has given you follow-up instructions, it is very important that you return for a follow-up appointment. Not following up as directed could result in a chronic or permanent injury, pain, and disability.   Only take over-the-counter or prescription medicines for pain, discomfort, or fever as directed by your caregiver.   If you are given antibiotics, take them as directed. Finish them even if you start to feel better.  You may need a tetanus shot if:   You cannot remember when you had your last tetanus  shot.   You have never had a tetanus shot.  If you got a tetanus shot, your arm may swell, get red, and feel warm to the touch. This is common and not a problem. If you need a tetanus shot and you choose not to have one, there is a rare chance of getting tetanus. Sickness from tetanus can be serious.  You may need a rabies shot if an animal bite caused your puncture wound.  SEEK MEDICAL CARE IF:    You have redness, swelling, or increasing pain in the wound.   You have red streaks going away from the wound.   You notice a bad smell coming from the wound or dressing.   You have yellowish-white fluid (pus) coming from the wound.   You are treated with an antibiotic for infection, but the infection is not getting better.   You notice something in the wound, such as rubber from your shoe, cloth, or another object.   You have a fever.   You have severe pain.   You have difficulty breathing.   You feel dizzy or faint.   You cannot stop vomiting.   You lose feeling, develop numbness, or cannot move a limb below the wound.   Your symptoms worsen.  MAKE SURE YOU:   Understand these instructions.   Will watch your condition.   Will get help right away if you are not doing well or get worse.    [employer].

## 2012-03-13 NOTE — ED Notes (Signed)
Patient reports he has a splinter in his right forearm.  He tried to remove the splinter but was not successful.  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

## 2012-04-08 ENCOUNTER — Encounter (INDEPENDENT_AMBULATORY_CARE_PROVIDER_SITE_OTHER): Payer: Self-pay | Admitting: Surgery

## 2012-04-28 ENCOUNTER — Other Ambulatory Visit: Payer: Self-pay | Admitting: Orthopedic Surgery

## 2012-04-28 DIAGNOSIS — M545 Low back pain, unspecified: Secondary | ICD-10-CM

## 2012-04-30 ENCOUNTER — Ambulatory Visit
Admission: RE | Admit: 2012-04-30 | Discharge: 2012-04-30 | Disposition: A | Discharge status: Home / Self Care | Payer: Medicare Other | Source: Ambulatory Visit | Attending: Orthopedic Surgery | Admitting: Orthopedic Surgery

## 2012-04-30 DIAGNOSIS — M545 Low back pain, unspecified: Secondary | ICD-10-CM

## 2012-05-02 ENCOUNTER — Ambulatory Visit
Admission: RE | Admit: 2012-05-02 | Discharge: 2012-05-02 | Disposition: A | Discharge status: Home / Self Care | Payer: Medicare Other | Source: Ambulatory Visit | Attending: Orthopedic Surgery | Admitting: Orthopedic Surgery

## 2012-05-02 DIAGNOSIS — M545 Low back pain, unspecified: Secondary | ICD-10-CM

## 2012-05-13 ENCOUNTER — Ambulatory Visit: Payer: Medicare Other | Admitting: Radiation Oncology

## 2012-05-19 ENCOUNTER — Encounter: Payer: Self-pay | Admitting: Radiation Oncology

## 2012-05-20 ENCOUNTER — Ambulatory Visit
Admission: RE | Admit: 2012-05-20 | Discharge: 2012-05-20 | Disposition: A | Discharge status: Home / Self Care | Payer: Medicare Other | Source: Ambulatory Visit | Attending: Radiation Oncology | Admitting: Radiation Oncology

## 2012-05-20 ENCOUNTER — Telehealth: Payer: Self-pay | Admitting: *Deleted

## 2012-05-20 ENCOUNTER — Encounter: Payer: Self-pay | Admitting: Radiation Oncology

## 2012-05-20 VITALS — BP 147/90 | HR 68 | Temp 98.2°F | Wt 175.8 lb

## 2012-05-20 DIAGNOSIS — C801 Malignant (primary) neoplasm, unspecified: Secondary | ICD-10-CM

## 2012-05-20 NOTE — Progress Notes (Signed)
Here for routine follow up completion of radiation of left true vocal cord December 2009. Most frequent concern with  Lower back disc bulging but not really having pain at present.

## 2012-05-20 NOTE — Telephone Encounter (Signed)
CALLED PATIENT TO INFORM OF FU VISIT FOR 11-25-12- 11:30 AM, SPOKE WITH PATIENT AND HE IS AWARE OF THIS APPT.

## 2012-05-20 NOTE — Progress Notes (Signed)
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  He notes unchanged hoarseness upon awakening in the morning which improves during the course of the day. He denies any other changes recently.                              ALLERGIES:  is allergic to penicillins.  Meds: Current Outpatient Prescriptions  Medication Sig Dispense Refill  . aspirin 325 MG EC tablet Take 325 mg by mouth daily as needed. pain      . mometasone (NASONEX) 50 MCG/ACT nasal spray Place 2 sprays into the nose daily as needed. congestion      . simvastatin (ZOCOR) 20 MG tablet Take 20 mg by mouth daily.      . Tamsulosin HCl (FLOMAX) 0.4 MG CAPS Take 0.4 mg by mouth daily.         Physical Findings: The patient is in no acute distress. Patient is alert and oriented.  weight is 175 lb 12.8 oz (79.742 kg). His temperature is 98.2 F (36.8 C). His blood pressure is 147/90 and his pulse is 68. His oxygen saturation is 98%. . His neck is free of any appreciable lymphadenopathy or significant telangiectasia. The oral cavity contains good dentition with no mucosal irregularities. Indirect mirror exam provides visualization of the posterior oropharynx and larynx. The true vocal cords appear normal. No significant changes.  Lab Findings: TSH in January was normal  Radiographic Findings: Dg Eye Foreign Body  04/30/2012  *RADIOLOGY REPORT*  Clinical Data: MRI metal screening.  ORBITS FOR FOREIGN BODY - 2 VIEW  Comparison: 08/27/2009.  Findings: No radiopaque orbital foreign bodies.   IMPRESSION: Negative.  Original Report Authenticated By: Andreas Newport, M.D.   Mr Lumbar Spine Wo Contrast  05/02/2012  *RADIOLOGY REPORT*  Clinical Data: Low back pain with left leg weakness for 2 weeks. History of vocal cord cancer.  No previous relevant surgery.  MRI LUMBAR SPINE WITHOUT CONTRAST  Technique:  Multiplanar and multiecho pulse sequences of the lumbar spine were obtained without intravenous contrast.  Comparison: PET CT 07/27/2008.  Findings: As correlated with prior study, the patient has five lumbar type vertebral bodies.  The alignment is normal.  There are endplate degenerative changes anteriorly at T12-L1 and L1-L2. There are more diffuse endplate degenerative changes at L5-S1. There is no evidence of acute fracture, pars defect or metastatic disease.  The conus medullaris extends to the T12-L1 level and appears normal. There are no paraspinal abnormalities.  T12-L1:  Mild disc bulging with anterior osteophytes.  No spinal stenosis or nerve root encroachment.  L1-L2:  Tiny left paracentral disc protrusion.  No spinal stenosis or nerve root encroachment.  L2-L3:  There is disc bulging mildly eccentric to the left with mild facet and ligamentous hypertrophy.  Both lateral recesses are mildly narrowed, left greater than right.  The foramina appear sufficiently patent. There is possible extraforaminal left L2 nerve root encroachment without displacement.  L3-L4:  Mild disc bulging with mild facet and  ligamentous hypertrophy.  Both lateral recesses are mildly narrowed.  The foramina are sufficiently patent.  L4-L5:  There is disc space loss with disc bulging and posterior osteophytes.  There is mild facet and ligamentous hypertrophy. There is mild inferior foraminal narrowing bilaterally without definite L4 nerve root encroachment.  The central canal and lateral recesses are adequately patent.  L5-S1:  There is disc space loss with disc bulging and posterior osteophytes.  There is mild facet and  ligamentous hypertrophy. Mild inferior foraminal narrowing is present bilaterally.  The central canal and lateral recesses are adequately patent at  IMPRESSION:  1.  No acute osseous findings, malalignment or evidence of metastatic disease. 2.  Mild spondylosis, greatest at L4-L5 and L5-S1 where there is loss of disc height and posterior osteophyte formation contributing to mild inferior foraminal narrowing bilaterally. No high-grade foraminal stenosis demonstrated. 3.  The lateral recesses are mildly narrowed bilaterally at L2-L3 and L3-L4. Extraforaminal left L2 nerve root encroachment by disc material at L2-L3 is possible.  Original Report Authenticated By: Gerrianne Scale, M.D.    Impression:  The patient currently exhibits no evidence recurrence.  Plan:  The patient will return for routine followup in 6 months. We will recheck his annual TSH level that point to rule out postradiation hypothyroidism. We will also obtain an annual chest imaging chest x-ray or other chest imaging has not been performed prior to that visit.  _____________________________________  Artist Pais. Kathrynn Running, M.D.

## 2012-10-27 ENCOUNTER — Telehealth: Payer: Self-pay | Admitting: *Deleted

## 2012-10-27 NOTE — Telephone Encounter (Signed)
CALLED PATIENT TO ALTER FU VISIT FOR 11-25-12, DUE TO DR. MANNING BEING IN THE OR, RESCHEDULED FOR 12-02-12 AT 10:45 AM , PATIENT'S WIFE AGREED TO APPT.

## 2012-11-12 ENCOUNTER — Encounter (INDEPENDENT_AMBULATORY_CARE_PROVIDER_SITE_OTHER): Payer: Self-pay | Admitting: Surgery

## 2012-11-12 ENCOUNTER — Ambulatory Visit (INDEPENDENT_AMBULATORY_CARE_PROVIDER_SITE_OTHER): Payer: Medicare Other | Admitting: Surgery

## 2012-11-12 VITALS — BP 134/72 | HR 72 | Resp 12 | Ht 70.0 in | Wt 180.0 lb

## 2012-11-12 DIAGNOSIS — R1032 Left lower quadrant pain: Secondary | ICD-10-CM

## 2012-11-12 NOTE — Progress Notes (Signed)
Dayna Ramus 67 y.o.  Body mass index is 25.83 kg/(m^2).  Patient Active Problem List  Diagnosis  . 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 Dyslipidemia  . GERD (gastroesophageal reflux disease)    Allergies  Allergen Reactions  . Penicillins Hives    Past Surgical History  Procedure Date  . Tonsillectomy     as a child  . Left knee arthoscopy     x 2  . Inguinal hernia repair  03/2005    left   . Finger surgery     right fifth  . Radial keratotomy     20 years ago/ bi lateral  . Nasal sinus surgery 1990's  . Basal cell excisions     by dr. hall  . Cervical spine surgery    Gaspar Garbe, MD No diagnosis found.  Mr. Gardiner Coins had a left inguinal hernia repair performed by me in June, 2006. He had a left indirect inguinal hernia which I repaired by tightening his ring with a 2-0 Prolene and then used a piece of mesh on the floor clear.  About 4 weeks ago he was calling some gravel in chart habits and left inguinal discomfort. Not a definite pain and discomfort. On examination I cannot feel a recurrent hernia on either the right side or the left side. I think that this is straining. I advised him to use ibuprofen. I advised him to call in 3-4 weeks it is still there and I would recheck him in likely do a CT scan of his abdomen  The plan is if he gets better he will not call back. He still having pain in 3-4 weeks he'll call back for likely get a CT scan of his pelvis. Matt B. Daphine Deutscher, MD, [facility] [phone] beeper [phone]  11/12/2012 1:48 PM

## 2012-11-12 NOTE — Patient Instructions (Signed)

## 2012-11-25 ENCOUNTER — Ambulatory Visit: Payer: Medicare Other | Admitting: Radiation Oncology

## 2012-12-02 ENCOUNTER — Encounter: Payer: Self-pay | Admitting: Radiation Oncology

## 2012-12-02 ENCOUNTER — Ambulatory Visit
Admission: RE | Admit: 2012-12-02 | Discharge: 2012-12-02 | Disposition: A | Discharge status: Home / Self Care | Payer: Medicare Other | Source: Ambulatory Visit | Attending: Radiation Oncology | Admitting: Radiation Oncology

## 2012-12-02 ENCOUNTER — Ambulatory Visit (HOSPITAL_COMMUNITY)
Admission: RE | Admit: 2012-12-02 | Discharge: 2012-12-02 | Disposition: A | Discharge status: Home / Self Care | Payer: Medicare Other | Source: Ambulatory Visit | Attending: Radiation Oncology | Admitting: Radiation Oncology

## 2012-12-02 VITALS — BP 131/89 | HR 77 | Temp 97.9°F | Resp 18 | Wt 182.3 lb

## 2012-12-02 DIAGNOSIS — C320 Malignant neoplasm of glottis: Secondary | ICD-10-CM | POA: Insufficient documentation

## 2012-12-02 DIAGNOSIS — Z923 Personal history of irradiation: Secondary | ICD-10-CM | POA: Insufficient documentation

## 2012-12-02 DIAGNOSIS — C329 Malignant neoplasm of larynx, unspecified: Secondary | ICD-10-CM | POA: Insufficient documentation

## 2012-12-02 DIAGNOSIS — T66XXXA Radiation sickness, unspecified, initial encounter: Secondary | ICD-10-CM

## 2012-12-02 DIAGNOSIS — Z79899 Other long term (current) drug therapy: Secondary | ICD-10-CM | POA: Insufficient documentation

## 2012-12-02 LAB — TSH: TSH: 2.096 u[IU]/mL (ref 0.350–4.500)

## 2012-12-02 MED ORDER — LARYNGOSCOPY SOLUTION RAD-ONC
15.0000 mL | Freq: Once | TOPICAL | Status: AC
  Administered 2012-12-02: 15 mL via TOPICAL
  Filled 2012-12-02: qty 15

## 2012-12-02 NOTE — Addendum Note (Signed)
Encounter addended by: Oneita Hurt, MD on: 12/02/2012 11:45 AM<BR>     Documentation filed: Clinical Notes, Notes Section

## 2012-12-02 NOTE — Addendum Note (Signed)
Encounter addended by: Agnes Lawrence, RN on: 12/02/2012 11:42 AM<BR>     Documentation filed: Inpatient MAR

## 2012-12-02 NOTE — Addendum Note (Signed)
Encounter addended by: Agnes Lawrence, RN on: 12/02/2012 11:40 AM<BR>     Documentation filed: Charges VN, Orders

## 2012-12-02 NOTE — Progress Notes (Addendum)
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 Patient denies pain at this time. However, patient does report continued left groin discomfort. Patient reports that he saw Dr. Daphine Deutscher reference left groin discomfort and he instructed him to take motrin and call in a month if the pain had not resolved. Patient reports that he saw Dr. Ezzard Standing 05/2012 and was scoped. Also, he reports seeing Dr. Ezzard Standing 09/2012 due to an ear infection. Patient reports a dry cough, nasal congestion and sore throat x 3 weeks for which he is taking robitussin. Patient reports that hoarseness continues each morning but, resolves during the day. Patient denies pain or difficulty swallowing. Patient denies nausea, vomiting, headache or dizziness. Patient denies diarrhea or constipation. Patient reports difficulty sleeping continues and is more aggrevated now by cough. Patient reports a good appetite and that he is having no difficulty maintaining his weight. MRI of low back done during the summer of 2013 for low back pain radiating to left leg causing weakness. 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 Last TSH drawn 11/06/2011.  ALLERGIES:  is allergic to penicillins.  Meds: Current Outpatient Prescriptions  Medication Sig Dispense Refill  . mometasone (NASONEX) 50 MCG/ACT nasal spray Place 2 sprays into the nose daily as needed. congestion      . simvastatin (ZOCOR) 20 MG tablet Take 20 mg by mouth daily.      . Tamsulosin HCl (FLOMAX) 0.4 MG CAPS Take  0.4 mg by mouth daily.        No current facility-administered medications for this encounter.    Physical Findings: Patient alert and oriented to person, place, and time. No distress noted. Steady gait noted. Pleasant affect noted.  weight is 182 lb 4.8 oz (82.691 kg). His oral temperature is 97.9 F (36.6 C). His blood pressure is 131/89 and his pulse is 77. His respiration is 18 and oxygen saturation is 100%. Marland Kitchen  His neck is free of any appreciable lymphadenopathy or significant telangiectasia. The oral cavity contains good dentition with no mucosal irregularities. Indirect mirror exam provides visualization of the posterior oropharynx, but limited visualization of the larynx. So, I proceeded with fiberoptic laryngoscopy. The patient received local anesthesia with Neo-Synephrine and lidocaine mixture and the fiberoptic scope was introduced into the right nostril providing excellent visualization of the nasopharynx posterior oropharynx and larynx. All visualized structures appeared to be normal. The true vocal cords were pale and smooth intending symmetrically upon formation. The mucosal surface of the cords does show telangiectatic changes consistent with post radiation effect. .  Lab Findings: Lab Results  Component Value Date   WBC 6.1 06/12/2009   HGB 14.9 06/12/2009   HCT 43.7 06/12/2009   MCV 92.8 06/12/2009   PLT 168 06/12/2009   TSH level in January 2013 was normal.  Radiographic Findings: Chest x-ray in January 2013 showed no evidence of metastatic disease.  Impression:  The patient currently exhibits no evidence of recurrence. He also exhibits no evidence of post radiation hypothyroidism.  Plan:  The  patient is due for his annual TSH today. He is also due for a normal chest imaging. We will order a TSH and chest x-ray and he'll follow up in our office for routine checkup in 6 months.  _____________________________________  Artist Pais. Kathrynn Running, M.D.

## 2012-12-02 NOTE — Progress Notes (Addendum)
Patient presents to the clinic today accompanied by his wife for a follow up appointment with Dr. Kathrynn Running. Patient alert and oriented to person, place, and time. No distress noted. Steady gait noted. Pleasant affect noted. Patient denies pain at this time. However, patient does report continued left groin discomfort. Patient reports that he saw Dr. Daphine Deutscher reference left groin discomfort and he instructed him to take motrin and call in a month if the pain had not resolved. Patient reports that he saw Dr. Ezzard Standing 05/2012 and was scoped. Also, he reports seeing Dr. Ezzard Standing 09/2012 due to an ear infection. Patient reports a dry cough, nasal congestion and sore throat x 3 weeks for which he is taking robitussin. Patient reports that hoarseness continues each morning but, resolves during the day. Patient denies pain or difficulty swallowing. Patient denies nausea, vomiting, headache or dizziness. Patient denies diarrhea or constipation. Patient reports difficulty sleeping continues and is more aggrevated now by cough. Patient reports a good appetite and that he is having no difficulty maintaining his weight. MRI of low back done during the summer of 2013 for low back pain radiating to left leg causing weakness. 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 Last TSH drawn 11/06/2011. Reported all findings to Dr. Kathrynn Running.

## 2012-12-02 NOTE — Addendum Note (Signed)
Encounter addended by: Wilmer Floor, PHARMD on: 12/02/2012 11:41 AM<BR>     Documentation filed: Rx Order Verification

## 2012-12-02 NOTE — Addendum Note (Signed)
Encounter addended by: Oneita Hurt, MD on: 12/02/2012 11:38 AM<BR>     Documentation filed: Visit Diagnoses, Orders

## 2012-12-02 NOTE — Progress Notes (Signed)
Quick Note:  Please call patient with normal result.  Thanks. MM ______ 

## 2012-12-05 NOTE — Progress Notes (Signed)
Quick Note:  Please call patient with normal result.  Thanks. MM ______ 

## 2012-12-15 ENCOUNTER — Telehealth: Payer: Self-pay | Admitting: *Deleted

## 2012-12-15 NOTE — Telephone Encounter (Signed)
Saw to call patient with results from  Inb[facility], apologized to patients wife for not calling sooner , I had  missed earlier, , spoke with the wife which she said ,they have been out of town, gave her results CXRand TSH both normal per Dr,.,wife thanked [place] for the call 4:11 PM

## 2012-12-22 ENCOUNTER — Other Ambulatory Visit: Payer: Self-pay | Admitting: Dermatology

## 2013-05-26 ENCOUNTER — Ambulatory Visit: Payer: 59 | Admitting: Radiation Oncology

## 2013-06-09 ENCOUNTER — Ambulatory Visit: Payer: Medicare Other | Admitting: Radiation Oncology

## 2013-06-30 ENCOUNTER — Ambulatory Visit
Admission: RE | Admit: 2013-06-30 | Discharge: 2013-06-30 | Disposition: A | Discharge status: Home / Self Care | Payer: Medicare Other | Source: Ambulatory Visit | Attending: Radiation Oncology | Admitting: Radiation Oncology

## 2013-06-30 ENCOUNTER — Encounter: Payer: Self-pay | Admitting: Radiation Oncology

## 2013-06-30 VITALS — BP 128/83 | HR 65 | Temp 98.8°F | Ht 70.0 in | Wt 176.1 lb

## 2013-06-30 DIAGNOSIS — C329 Malignant neoplasm of larynx, unspecified: Secondary | ICD-10-CM

## 2013-06-30 NOTE — Progress Notes (Signed)
Mr. Tudisco here for fu  S/p radiation to the left true vocal cord which completed 09/20/2008.  He denies any odonyphagia nor dysphagia.

## 2013-06-30 NOTE — Progress Notes (Signed)
  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  He denies hoarseness, otalgia, odonyphagia, and dysphagia.  He is following with Dr. Annalee Drake.   ALLERGIES:  is allergic to penicillins.  Meds: Current Outpatient Prescriptions  Medication Sig Dispense Refill  . fish oil-omega-3 fatty acids 1000 MG capsule Take 1 g by mouth 2 (two) times daily.      . simvastatin (ZOCOR) 20 MG tablet Take 20 mg by mouth daily.      . Tamsulosin HCl (FLOMAX) 0.4 MG CAPS Take 0.4 mg by mouth daily.       Marland Kitchen HYDROcodone-acetaminophen (NORCO/VICODIN) 5-325 MG per tablet       . meloxicam (MOBIC) 7.5 MG tablet       . mometasone (NASONEX) 50 MCG/ACT nasal spray Place 2 sprays into the nose daily as needed. congestion       No current facility-administered medications for this encounter.    Physical Findings: The patient is in no acute distress. Patient is alert and oriented.  height is 5\' 10"  (1.778 m) and weight is 176 lb 1.6 oz (79.878 kg). His temperature is 98.8 F (37.1 C). His blood pressure is 128/83 and his pulse is 65. Marland KitchenHis neck is free of any appreciable lymphadenopathy or significant telangiectasia. The oral cavity contains good dentition with no mucosal irregularities. Indirect mirror exam provides visualization of the posterior oropharynx, and larynx. The true vocal cords were pale and smooth intending symmetrically upon phonation. The mucosal surface of the cords does show telangiectatic changes consistent with post radiation effect.  No significant  changes.  Lab Findings: Results for Peter, Drake (MRN [id number]) as of 07/03/2013 16:34  Ref. Range 12/02/2012 11:41  TSH Latest Range: 0.350-4.500 uIU/mL 2.096   Radiographic Findings:   *RADIOLOGY REPORT*  Clinical Data: Laryngeal cancer and restaging.  CHEST - 2 VIEW  Comparison: 06/12/2009  Findings: The heart size and mediastinal contours are within normal  limits. Both lungs are clear. The visualized skeletal structures  are unremarkable.  IMPRESSION:  Negative exam.  Original Report Authenticated By: Rosealee Albee, M.D.    Signed Date/Time   Phone Pager   Rosealee Albee 11/06/2011 2:04 PM [phone]   Impression:  The patient has no evidence of recurrence.  Plan:  CXR today then follow-up in 6 months.  _____________________________________  Artist Pais. Kathrynn Running, M.D.

## 2013-07-03 ENCOUNTER — Encounter: Payer: Self-pay | Admitting: Radiation Oncology

## 2013-10-24 ENCOUNTER — Encounter (HOSPITAL_COMMUNITY): Payer: Self-pay

## 2013-10-24 NOTE — Pre-Procedure Instructions (Signed)
Peter Drake  10/24/2013   Your procedure is scheduled on:  Wednesday November 02, 2013 @ 12:40 PM.  Report to [facility] Short Stay Entrance "A"  Admitting at 10:40 AM.  Call this number if you have problems the morning of surgery: [phone]   Remember:   Do not eat food or drink liquids after midnight.   Take these medicines the morning of surgery with A SIP OF WATER: Hydrocodone (Vicodin) if needed for pain, Nasonex nasal spray if needed,  Stop taking Aspirin, vitamins and herbal medications. Do not take any NSAIDs ie: Ibuprofen, Advil, Naproxen or any medication containing Aspirin (meloxicam (MOBIC) 7.5 MG tablet)   Do not wear jewelry.  Do not wear lotions, powders, or colognes. You may NOT wear deodorant.  Do not shave 48 hours prior to surgery.   Do not bring valuables to the hospital.  [facility] is not responsible for any belongings or valuables.               Contacts, dentures or bridgework may not be worn into surgery.  Leave suitcase in the car. After surgery it may be brought to your room.  For patients admitted to the hospital, discharge time is determined by your treatment team.               Patients discharged the day of surgery will not be allowed to drive home.  Name and phone number of your driver: Family/Friend  Special Instructions: Shower using CHG 2 nights before surgery and the night before surgery.  If you shower the day of surgery use CHG.  Use special wash - you have one bottle of CHG for all showers.  You should use approximately 1/3 of the bottle for each shower.   Please read over the following fact sheets that you were given: Pain Booklet, Coughing and Deep Breathing, Blood Transfusion Information and Surgical Site Infection Prevention

## 2013-10-25 ENCOUNTER — Encounter (HOSPITAL_COMMUNITY): Payer: Self-pay

## 2013-10-25 ENCOUNTER — Encounter (HOSPITAL_COMMUNITY)
Admission: RE | Admit: 2013-10-25 | Discharge: 2013-10-25 | Disposition: A | Discharge status: Home / Self Care | Payer: Medicare Other | Source: Ambulatory Visit | Attending: Orthopedic Surgery | Admitting: Orthopedic Surgery

## 2013-10-25 DIAGNOSIS — Z01818 Encounter for other preprocedural examination: Secondary | ICD-10-CM | POA: Insufficient documentation

## 2013-10-25 DIAGNOSIS — Z01812 Encounter for preprocedural laboratory examination: Secondary | ICD-10-CM | POA: Insufficient documentation

## 2013-10-25 HISTORY — DX: Unspecified osteoarthritis, unspecified site: M19.90

## 2013-10-25 HISTORY — DX: Unspecified cataract: H26.9

## 2013-10-25 HISTORY — DX: Benign prostatic hyperplasia without lower urinary tract symptoms: N40.0

## 2013-10-25 LAB — TYPE AND SCREEN
ABO/RH(D): O NEG
Antibody Screen: NEGATIVE

## 2013-10-25 LAB — CBC
HCT: 44.5 % (ref 39.0–52.0)
Hemoglobin: 15.4 g/dL (ref 13.0–17.0)
MCH: 31.7 pg (ref 26.0–34.0)
MCHC: 34.6 g/dL (ref 30.0–36.0)
MCV: 91.6 fL (ref 78.0–100.0)
Platelets: 166 10*3/uL (ref 150–400)
RBC: 4.86 MIL/uL (ref 4.22–5.81)
RDW: 12.6 % (ref 11.5–15.5)
WBC: 5.6 10*3/uL (ref 4.0–10.5)

## 2013-10-25 LAB — ABO/RH: ABO/RH(D): O NEG

## 2013-11-01 MED ORDER — CEFAZOLIN SODIUM-DEXTROSE 2-3 GM-% IV SOLR
2.0000 g | INTRAVENOUS | Status: AC
  Administered 2013-11-02: 2 g via INTRAVENOUS
  Filled 2013-11-01: qty 50

## 2013-11-02 ENCOUNTER — Encounter (HOSPITAL_COMMUNITY): Payer: Medicare Other | Admitting: Critical Care Medicine

## 2013-11-02 ENCOUNTER — Encounter (HOSPITAL_COMMUNITY)
Admission: RE | Disposition: A | Discharge status: Home / Self Care | Payer: Self-pay | Source: Ambulatory Visit | Attending: Orthopedic Surgery

## 2013-11-02 ENCOUNTER — Inpatient Hospital Stay (HOSPITAL_COMMUNITY)
Admission: RE | Admit: 2013-11-02 | Discharge: 2013-11-03 | DRG: 483 | Disposition: A | Discharge status: Home / Self Care | Payer: Medicare Other | Source: Ambulatory Visit | Attending: Orthopedic Surgery | Admitting: Orthopedic Surgery

## 2013-11-02 ENCOUNTER — Encounter (HOSPITAL_COMMUNITY): Payer: Self-pay | Admitting: Critical Care Medicine

## 2013-11-02 ENCOUNTER — Inpatient Hospital Stay (HOSPITAL_COMMUNITY): Payer: Medicare Other | Admitting: Critical Care Medicine

## 2013-11-02 DIAGNOSIS — Z23 Encounter for immunization: Secondary | ICD-10-CM

## 2013-11-02 DIAGNOSIS — K219 Gastro-esophageal reflux disease without esophagitis: Secondary | ICD-10-CM | POA: Diagnosis present

## 2013-11-02 DIAGNOSIS — M19019 Primary osteoarthritis, unspecified shoulder: Principal | ICD-10-CM | POA: Diagnosis present

## 2013-11-02 DIAGNOSIS — Z8249 Family history of ischemic heart disease and other diseases of the circulatory system: Secondary | ICD-10-CM

## 2013-11-02 DIAGNOSIS — Z800 Family history of malignant neoplasm of digestive organs: Secondary | ICD-10-CM

## 2013-11-02 DIAGNOSIS — Z87891 Personal history of nicotine dependence: Secondary | ICD-10-CM

## 2013-11-02 DIAGNOSIS — Z96619 Presence of unspecified artificial shoulder joint: Secondary | ICD-10-CM

## 2013-11-02 DIAGNOSIS — E785 Hyperlipidemia, unspecified: Secondary | ICD-10-CM | POA: Diagnosis present

## 2013-11-02 DIAGNOSIS — Z808 Family history of malignant neoplasm of other organs or systems: Secondary | ICD-10-CM

## 2013-11-02 HISTORY — PX: TOTAL SHOULDER ARTHROPLASTY: SHX126

## 2013-11-02 LAB — BASIC METABOLIC PANEL
BUN: 9 mg/dL (ref 6–23)
CO2: 24 mEq/L (ref 19–32)
Calcium: 9.1 mg/dL (ref 8.4–10.5)
Chloride: 107 mEq/L (ref 96–112)
Creatinine, Ser: 0.69 mg/dL (ref 0.50–1.35)
GFR calc Af Amer: >90 mL/min (ref >90)
GFR calc non Af Amer: >90 mL/min (ref >90)
Glucose, Bld: 98 mg/dL (ref 70–99)
Potassium: 4.5 mEq/L (ref 3.7–5.3)
Sodium: 144 mEq/L (ref 137–147)

## 2013-11-02 SURGERY — ARTHROPLASTY, SHOULDER, TOTAL
Anesthesia: Regional | Site: Shoulder | Laterality: Right

## 2013-11-02 MED ORDER — DEXAMETHASONE SODIUM PHOSPHATE 4 MG/ML IJ SOLN
INTRAMUSCULAR | Status: DC | PRN
  Administered 2013-11-02: 4 mg via INTRAVENOUS

## 2013-11-02 MED ORDER — CHLORHEXIDINE GLUCONATE 4 % EX LIQD: 60.0000 mL | Freq: Once | CUTANEOUS | Status: DC

## 2013-11-02 MED ORDER — DIAZEPAM 5 MG PO TABS: 5.0000 mg | ORAL_TABLET | Freq: Four times a day (QID) | ORAL | Status: DC | PRN

## 2013-11-02 MED ORDER — DIPHENHYDRAMINE HCL 12.5 MG/5ML PO ELIX: 12.5000 mg | ORAL_SOLUTION | ORAL | Status: DC | PRN

## 2013-11-02 MED ORDER — NEOSTIGMINE METHYLSULFATE 1 MG/ML IJ SOLN
INTRAMUSCULAR | Status: DC | PRN
  Administered 2013-11-02: 4 mg via INTRAVENOUS

## 2013-11-02 MED ORDER — SIMVASTATIN 20 MG PO TABS
20.0000 mg | ORAL_TABLET | Freq: Every day | ORAL | Status: DC
  Administered 2013-11-02: 20 mg via ORAL
  Filled 2013-11-02 (×2): qty 1

## 2013-11-02 MED ORDER — MIDAZOLAM HCL 2 MG/2ML IJ SOLN
INTRAMUSCULAR | Status: AC
  Administered 2013-11-02: 2 mg
  Filled 2013-11-02: qty 2

## 2013-11-02 MED ORDER — FENTANYL CITRATE 0.05 MG/ML IJ SOLN
INTRAMUSCULAR | Status: DC | PRN
  Administered 2013-11-02: 100 ug via INTRAVENOUS

## 2013-11-02 MED ORDER — 0.9 % SODIUM CHLORIDE (POUR BTL) OPTIME
TOPICAL | Status: DC | PRN
  Administered 2013-11-02: 1000 mL

## 2013-11-02 MED ORDER — ROPIVACAINE HCL 5 MG/ML IJ SOLN
INTRAMUSCULAR | Status: DC | PRN
  Administered 2013-11-02: 20 mL via PERINEURAL

## 2013-11-02 MED ORDER — POLYETHYLENE GLYCOL 3350 17 G PO PACK: 17.0000 g | PACK | Freq: Every day | ORAL | Status: DC | PRN

## 2013-11-02 MED ORDER — MAGNESIUM CITRATE PO SOLN: 1.0000 | Freq: Once | ORAL | Status: AC | PRN

## 2013-11-02 MED ORDER — ONDANSETRON HCL 4 MG PO TABS: 4.0000 mg | ORAL_TABLET | Freq: Four times a day (QID) | ORAL | Status: DC | PRN

## 2013-11-02 MED ORDER — BISACODYL 5 MG PO TBEC: 5.0000 mg | DELAYED_RELEASE_TABLET | Freq: Every day | ORAL | Status: DC | PRN

## 2013-11-02 MED ORDER — PHENYLEPHRINE HCL 10 MG/ML IJ SOLN
10.0000 mg | INTRAVENOUS | Status: DC | PRN
  Administered 2013-11-02: 50 ug/min via INTRAVENOUS

## 2013-11-02 MED ORDER — ROCURONIUM BROMIDE 100 MG/10ML IV SOLN
INTRAVENOUS | Status: DC | PRN
  Administered 2013-11-02: 50 mg via INTRAVENOUS

## 2013-11-02 MED ORDER — METOCLOPRAMIDE HCL 10 MG PO TABS: 5.0000 mg | ORAL_TABLET | Freq: Three times a day (TID) | ORAL | Status: DC | PRN

## 2013-11-02 MED ORDER — PNEUMOCOCCAL VAC POLYVALENT 25 MCG/0.5ML IJ INJ
0.5000 mL | INJECTION | INTRAMUSCULAR | Status: AC
Start: 2013-11-03 — End: 2013-11-03
  Administered 2013-11-03: 0.5 mL via INTRAMUSCULAR
  Filled 2013-11-02: qty 0.5

## 2013-11-02 MED ORDER — ACETAMINOPHEN 325 MG PO TABS: 650.0000 mg | ORAL_TABLET | Freq: Four times a day (QID) | ORAL | Status: DC | PRN

## 2013-11-02 MED ORDER — METOCLOPRAMIDE HCL 5 MG/ML IJ SOLN: 5.0000 mg | Freq: Three times a day (TID) | INTRAMUSCULAR | Status: DC | PRN

## 2013-11-02 MED ORDER — PROPOFOL 10 MG/ML IV BOLUS
INTRAVENOUS | Status: DC | PRN
  Administered 2013-11-02: 200 mg via INTRAVENOUS

## 2013-11-02 MED ORDER — CEFAZOLIN SODIUM-DEXTROSE 2-3 GM-% IV SOLR
2.0000 g | Freq: Four times a day (QID) | INTRAVENOUS | Status: AC
  Administered 2013-11-02 – 2013-11-03 (×3): 2 g via INTRAVENOUS
  Filled 2013-11-02 (×3): qty 50

## 2013-11-02 MED ORDER — ALUM & MAG HYDROXIDE-SIMETH 200-200-20 MG/5ML PO SUSP: 30.0000 mL | ORAL | Status: DC | PRN

## 2013-11-02 MED ORDER — LACTATED RINGERS IV SOLN
INTRAVENOUS | Status: DC
  Administered 2013-11-02 (×2): via INTRAVENOUS

## 2013-11-02 MED ORDER — PHENOL 1.4 % MT LIQD: 1.0000 | OROMUCOSAL | Status: DC | PRN

## 2013-11-02 MED ORDER — ONDANSETRON HCL 4 MG/2ML IJ SOLN: 4.0000 mg | Freq: Four times a day (QID) | INTRAMUSCULAR | Status: DC | PRN

## 2013-11-02 MED ORDER — TAMSULOSIN HCL 0.4 MG PO CAPS
0.4000 mg | ORAL_CAPSULE | Freq: Every day | ORAL | Status: DC
  Administered 2013-11-02 – 2013-11-03 (×2): 0.4 mg via ORAL
  Filled 2013-11-02 (×2): qty 1

## 2013-11-02 MED ORDER — DOCUSATE SODIUM 100 MG PO CAPS
100.0000 mg | ORAL_CAPSULE | Freq: Two times a day (BID) | ORAL | Status: DC
  Administered 2013-11-02 – 2013-11-03 (×2): 100 mg via ORAL
  Filled 2013-11-02 (×3): qty 1

## 2013-11-02 MED ORDER — OXYCODONE-ACETAMINOPHEN 5-325 MG PO TABS
1.0000 | ORAL_TABLET | ORAL | Status: DC | PRN
  Administered 2013-11-02 – 2013-11-03 (×3): 2 via ORAL
  Filled 2013-11-02 (×3): qty 2

## 2013-11-02 MED ORDER — KETOROLAC TROMETHAMINE 15 MG/ML IJ SOLN
15.0000 mg | Freq: Four times a day (QID) | INTRAMUSCULAR | Status: DC
  Administered 2013-11-02 – 2013-11-03 (×4): 15 mg via INTRAVENOUS
  Filled 2013-11-02 (×8): qty 1

## 2013-11-02 MED ORDER — ONDANSETRON HCL 4 MG/2ML IJ SOLN
INTRAMUSCULAR | Status: DC | PRN
  Administered 2013-11-02: 4 mg via INTRAVENOUS

## 2013-11-02 MED ORDER — ACETAMINOPHEN 650 MG RE SUPP: 650.0000 mg | Freq: Four times a day (QID) | RECTAL | Status: DC | PRN

## 2013-11-02 MED ORDER — ARTIFICIAL TEARS OP OINT
TOPICAL_OINTMENT | OPHTHALMIC | Status: DC | PRN
  Administered 2013-11-02: 1 via OPHTHALMIC

## 2013-11-02 MED ORDER — HYDROMORPHONE HCL PF 1 MG/ML IJ SOLN
0.2500 mg | INTRAMUSCULAR | Status: DC | PRN
  Administered 2013-11-02: 1 mg via INTRAVENOUS
  Filled 2013-11-02: qty 1

## 2013-11-02 MED ORDER — MIDAZOLAM HCL 5 MG/5ML IJ SOLN
INTRAMUSCULAR | Status: DC | PRN
Start: 2013-11-02 — End: 2013-11-02
  Administered 2013-11-02: 1 mg via INTRAVENOUS

## 2013-11-02 MED ORDER — MENTHOL 3 MG MT LOZG: 1.0000 | LOZENGE | OROMUCOSAL | Status: DC | PRN

## 2013-11-02 MED ORDER — FENTANYL CITRATE 0.05 MG/ML IJ SOLN
INTRAMUSCULAR | Status: AC
  Administered 2013-11-02: 50 ug
  Filled 2013-11-02: qty 2

## 2013-11-02 MED ORDER — LIDOCAINE HCL (CARDIAC) 20 MG/ML IV SOLN
INTRAVENOUS | Status: DC | PRN
  Administered 2013-11-02: 80 mg via INTRAVENOUS

## 2013-11-02 MED ORDER — GLYCOPYRROLATE 0.2 MG/ML IJ SOLN
INTRAMUSCULAR | Status: DC | PRN
  Administered 2013-11-02: 0.2 mg via INTRAVENOUS
  Administered 2013-11-02: 0.6 mg via INTRAVENOUS

## 2013-11-02 MED ORDER — LACTATED RINGERS IV SOLN
INTRAVENOUS | Status: DC
  Administered 2013-11-02: 19:00:00 via INTRAVENOUS

## 2013-11-02 SURGICAL SUPPLY — 69 items
BLADE SAW SGTL 83.5X18.5 (BLADE) ×3 IMPLANT
CEMENT BONE DEPUY (Cement) ×3 IMPLANT
CLOSURE STERI-STRIP 1/2X4 (GAUZE/BANDAGES/DRESSINGS) ×1
CLOSURE WOUND 1/2 X4 (GAUZE/BANDAGES/DRESSINGS) ×1
CLOTH BEACON ORANGE TIMEOUT ST (SAFETY) ×3 IMPLANT
CLSR STERI-STRIP ANTIMIC 1/2X4 (GAUZE/BANDAGES/DRESSINGS) ×2 IMPLANT
COVER SURGICAL LIGHT HANDLE (MISCELLANEOUS) ×3 IMPLANT
DRAPE INCISE IOBAN 66X45 STRL (DRAPES) ×3 IMPLANT
DRAPE SURG 17X11 SM STRL (DRAPES) ×3 IMPLANT
DRAPE SURG 17X23 STRL (DRAPES) ×3 IMPLANT
DRAPE U-SHAPE 47X51 STRL (DRAPES) ×3 IMPLANT
DRILL BIT 7/64X5 (BIT) IMPLANT
DRSG AQUACEL AG ADV 3.5X10 (GAUZE/BANDAGES/DRESSINGS) ×3 IMPLANT
DRSG MEPILEX BORDER 4X8 (GAUZE/BANDAGES/DRESSINGS) IMPLANT
DURAPREP 26ML APPLICATOR (WOUND CARE) ×6 IMPLANT
ELECT BLADE 4.0 EZ CLEAN MEGAD (MISCELLANEOUS) ×3
ELECT CAUTERY BLADE 6.4 (BLADE) ×3 IMPLANT
ELECT REM PT RETURN 9FT ADLT (ELECTROSURGICAL) ×3
ELECTRODE BLDE 4.0 EZ CLN MEGD (MISCELLANEOUS) ×1 IMPLANT
ELECTRODE REM PT RTRN 9FT ADLT (ELECTROSURGICAL) ×1 IMPLANT
FACESHIELD LNG OPTICON STERILE (SAFETY) ×9 IMPLANT
GLENOID ANCHOR PEG CROSSLK 48 (Orthopedic Implant) ×3 IMPLANT
GLOVE BIO SURGEON STRL SZ7 (GLOVE) ×3 IMPLANT
GLOVE BIO SURGEON STRL SZ7.5 (GLOVE) ×3 IMPLANT
GLOVE BIO SURGEON STRL SZ8 (GLOVE) ×3 IMPLANT
GLOVE BIOGEL PI IND STRL 6.5 (GLOVE) ×2 IMPLANT
GLOVE BIOGEL PI INDICATOR 6.5 (GLOVE) ×4
GLOVE ECLIPSE 6.5 STRL STRAW (GLOVE) ×3 IMPLANT
GLOVE EUDERMIC 7 POWDERFREE (GLOVE) ×3 IMPLANT
GLOVE SS BIOGEL STRL SZ 7.5 (GLOVE) ×1 IMPLANT
GLOVE SUPERSENSE BIOGEL SZ 7.5 (GLOVE) ×2
GLOVE SURG SS PI 6.5 STRL IVOR (GLOVE) ×3 IMPLANT
GOWN L4 LG 24 PK N/S (GOWN DISPOSABLE) ×9 IMPLANT
GOWN L4 XLG 20 PK N/S (GOWN DISPOSABLE) ×6 IMPLANT
GOWN STRL NON-REIN LRG LVL3 (GOWN DISPOSABLE) IMPLANT
GOWN STRL REIN XL XLG (GOWN DISPOSABLE) IMPLANT
HUMERAL HEAD 52MMX21MM (Trauma) ×3 IMPLANT
HUMERAL STEM 14MM (Trauma) ×3 IMPLANT
KIT BASIN OR (CUSTOM PROCEDURE TRAY) ×3 IMPLANT
KIT ROOM TURNOVER OR (KITS) ×3 IMPLANT
MANIFOLD NEPTUNE II (INSTRUMENTS) ×3 IMPLANT
NDL SUT 6 .5 CRC .975X.05 MAYO (NEEDLE) ×1 IMPLANT
NEEDLE HYPO 25GX1X1/2 BEV (NEEDLE) IMPLANT
NEEDLE MAYO TAPER (NEEDLE) ×2
NS IRRIG 1000ML POUR BTL (IV SOLUTION) ×3 IMPLANT
PACK SHOULDER (CUSTOM PROCEDURE TRAY) ×3 IMPLANT
PAD ARMBOARD 7.5X6 YLW CONV (MISCELLANEOUS) ×6 IMPLANT
PASSER SUT SWANSON 36MM LOOP (INSTRUMENTS) ×3 IMPLANT
PIN METAGLENE 2.5 (PIN) ×3 IMPLANT
SLING ARM FOAM STRAP LRG (SOFTGOODS) IMPLANT
SLING ARM FOAM STRAP XLG (SOFTGOODS) IMPLANT
SMARTMIX MINI TOWER (MISCELLANEOUS) ×3
SPONGE LAP 18X18 X RAY DECT (DISPOSABLE) ×3 IMPLANT
SPONGE LAP 4X18 X RAY DECT (DISPOSABLE) ×3 IMPLANT
STRIP CLOSURE SKIN 1/2X4 (GAUZE/BANDAGES/DRESSINGS) ×2 IMPLANT
SUCTION FRAZIER TIP 10 FR DISP (SUCTIONS) ×3 IMPLANT
SUT BONE WAX W31G (SUTURE) IMPLANT
SUT FIBERWIRE #2 38 T-5 BLUE (SUTURE) ×9
SUT MNCRL AB 3-0 PS2 18 (SUTURE) ×3 IMPLANT
SUT VIC AB 1 CT1 27 (SUTURE) ×4
SUT VIC AB 1 CT1 27XBRD ANBCTR (SUTURE) ×2 IMPLANT
SUT VIC AB 2-0 CT1 27 (SUTURE) ×2
SUT VIC AB 2-0 CT1 TAPERPNT 27 (SUTURE) ×1 IMPLANT
SUTURE FIBERWR #2 38 T-5 BLUE (SUTURE) ×3 IMPLANT
SYR CONTROL 10ML LL (SYRINGE) IMPLANT
TOWEL OR 17X24 6PK STRL BLUE (TOWEL DISPOSABLE) ×3 IMPLANT
TOWEL OR 17X26 10 PK STRL BLUE (TOWEL DISPOSABLE) ×3 IMPLANT
TOWER SMARTMIX MINI (MISCELLANEOUS) ×1 IMPLANT
WATER STERILE IRR 1000ML POUR (IV SOLUTION) ×3 IMPLANT

## 2013-11-02 NOTE — H&P (Signed)
Peter Drake    Chief Complaint: OA OF RIGHT SHOULDER HPI: The patient is a 68 y.o. male with end stage right shoulder OA  Past Medical History  Diagnosis Date  . 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 Dyslipidemia   . History of radiation therapy 08/04/08-09/20/08    larynx  . GERD (gastroesophageal reflux disease)   . Allergy     pcn  . Colorectal polyps 1/03  . Anxiety   . Radiation 08/04/08-09/20/08    Larynx   . Hyperlipidemia   . BPH (benign prostatic hypertrophy)     Hx: of  . Early cataracts, bilateral     Hx: of  . Arthritis     HX: of OA    Past Surgical History  Procedure Laterality Date  . Tonsillectomy      as a child  . Left knee arthoscopy      x 2  . Inguinal hernia repair   03/2005    left   . Finger surgery      right fifth  . Radial keratotomy      20 years ago/ bi lateral  . Nasal sinus surgery  1990's  . Basal cell excisions      by dr. hall  . Cervical spine surgery    . Colonoscopy w/ biopsies and polypectomy      Hx: of    Family History  Problem Relation Age of Onset  . Rectal cancer Sister   . Cancer Sister     rectal  . Melanoma Daughter 87    treated  . Heart disease Mother   . COPD Father     Social History:  reports that he has quit smoking. His smoking use included Cigarettes. He smoked 0.00 packs per day for 15 years. He has quit using smokeless tobacco. He reports that he drinks alcohol. He reports that he does not use illicit drugs.  Allergies:  Allergies  Allergen Reactions  . Penicillins Hives    Medications Prior to Admission  Medication Sig Dispense Refill  . HYDROcodone-acetaminophen (NORCO/VICODIN) 5-325 MG per tablet Take 1 tablet by mouth every 4 (four) hours as needed for moderate pain or severe pain.       . meloxicam (MOBIC) 7.5 MG tablet Take 7.5 mg by mouth 2 (two) times daily as needed for pain.       . simvastatin (ZOCOR) 20 MG tablet Take 20 mg by mouth daily.      . Tamsulosin HCl  (FLOMAX) 0.4 MG CAPS Take 0.4 mg by mouth daily.       . mometasone (NASONEX) 50 MCG/ACT nasal spray Place 2 sprays into the nose daily as needed. congestion         Physical Exam: Right shoulder with painful and restricted motion as noted at recent office visits  Vitals  Temp:  [98.3 F (36.8 C)] 98.3 F (36.8 C) (01/14 1049) Pulse Rate:  [65-75] 72 (01/14 1134) Resp:  [9-20] 16 (01/14 1134) BP: (117-154)/(81-94) 121/88 mmHg (01/14 1132) SpO2:  [98 %-100 %] 100 % (01/14 1134)  Assessment/Plan  Impression: OA OF RIGHT SHOULDER  Plan of Action: Procedure(s): RIGHT TOTAL SHOULDER ARTHROPLASTY  Ennis Delpozo M 11/02/2013, 12:52 PM

## 2013-11-02 NOTE — Op Note (Signed)
11/02/2013  2:52 PM  PATIENT:   Peter Drake  68 y.o. male  PRE-OPERATIVE DIAGNOSIS:  OA OF RIGHT SHOULDER  POST-OPERATIVE DIAGNOSIS:  same  PROCEDURE:  R TSA 14 stem, 52x21 eccentric head, 48 glenoid  SURGEON:  Loren Sawaya, Metta Clines M.D.  ASSISTANTS: Shuford pac   ANESTHESIA:   GET + ISB  EBL: 250  SPECIMEN:  none  Drains: none   PATIENT DISPOSITION:  PACU - hemodynamically stable.    PLAN OF CARE: Admit to inpatient   Dictation# [id number]

## 2013-11-02 NOTE — Anesthesia Preprocedure Evaluation (Addendum)
Anesthesia Evaluation  Patient identified by MRN, date of birth, ID band Patient awake    Airway Mallampati: I TM Distance: >3 FB Neck ROM: Full    Dental  (+) Dental Advisory Given and Teeth Intact   Pulmonary former smoker,    Pulmonary exam normal       Cardiovascular Exercise Tolerance: Good METS: 5 - 7 Mets - angina- CHF negative cardio ROS  Rate:Normal     Neuro/Psych PSYCHIATRIC DISORDERS Anxiety negative neurological ROS     GI/Hepatic Neg liver ROS, GERD-  Medicated and Controlled,  Endo/Other  negative endocrine ROS  Renal/GU negative Renal ROS  negative genitourinary   Musculoskeletal   Abdominal   Peds  Hematology negative hematology ROS (+)   Anesthesia Other Findings   Reproductive/Obstetrics                          Anesthesia Physical Anesthesia Plan  ASA: II  Anesthesia Plan: General and Regional   Post-op Pain Management:    Induction: Intravenous  Airway Management Planned: Oral ETT  Additional Equipment: None  Intra-op Plan:   Post-operative Plan: Extubation in OR  Informed Consent: I have reviewed the patients History and Physical, chart, labs and discussed the procedure including the risks, benefits and alternatives for the proposed anesthesia with the patient or authorized representative who has indicated his/her understanding and acceptance.   Dental advisory given  Plan Discussed with: Anesthesiologist and Surgeon  Anesthesia Plan Comments:        Anesthesia Quick Evaluation

## 2013-11-02 NOTE — Transfer of Care (Signed)
Immediate Anesthesia Transfer of Care Note  Patient: Peter Drake  Procedure(s) Performed: Procedure(s): RIGHT TOTAL SHOULDER ARTHROPLASTY (Right)  Patient Location: PACU  Anesthesia Type:General  Level of Consciousness: awake and patient cooperative  Airway & Oxygen Therapy: Patient Spontanous Breathing and Patient connected to nasal cannula oxygen  Post-op Assessment: Report given to PACU RN, Post -op Vital signs reviewed and stable and Patient moving all extremities  Post vital signs: Reviewed and stable  Complications: No apparent anesthesia complications

## 2013-11-02 NOTE — Anesthesia Procedure Notes (Addendum)
Anesthesia Regional Block:  Interscalene brachial plexus block  Pre-Anesthetic Checklist: ,, timeout performed, Correct Patient, Correct Site, Correct Laterality, Correct Procedure, Correct Position, site marked, Risks and benefits discussed,  Surgical consent,  Pre-op evaluation,  At surgeon's request and post-op pain management  Laterality: Upper and Right  Prep: chloraprep       Needles:  Injection technique: Single-shot  Needle Type: Stimiplex          Additional Needles:  Procedures: ultrasound guided (picture in chart) Interscalene brachial plexus block Narrative:  Start time: 11/02/2013 11:51 AM End time: 11/02/2013 12:01 PM Injection made incrementally with aspirations every 5 mL.  Performed by: Personally  Anesthesiologist: Moser   Procedure Name: Intubation Date/Time: 11/02/2013 1:08 PM Performed by: Carola Frost Pre-anesthesia Checklist: Patient identified, Timeout performed, Emergency Drugs available, Suction available and Patient being monitored Patient Re-evaluated:Patient Re-evaluated prior to inductionOxygen Delivery Method: Circle system utilized Preoxygenation: Pre-oxygenation with 100% oxygen Intubation Type: IV induction Ventilation: Mask ventilation without difficulty and Oral airway inserted - appropriate to patient size Laryngoscope Size: Mac and 4 Grade View: Grade II Tube type: Oral Tube size: 7.0 mm Number of attempts: 2 Airway Equipment and Method: Stylet Placement Confirmation: positive ETCO2,  ETT inserted through vocal cords under direct vision and breath sounds checked- equal and bilateral Secured at: 23 cm Tube secured with: Tape Dental Injury: Teeth and Oropharynx as per pre-operative assessment and Injury to lip  Comments: DLX1 by paramedic student. Small laceration to lip.  DLx1 by CRNA. Atraumatic insertion of ETT.

## 2013-11-02 NOTE — Preoperative (Signed)
Beta Blockers   Reason not to administer Beta Blockers:Not Applicable 

## 2013-11-02 NOTE — Anesthesia Postprocedure Evaluation (Signed)
Anesthesia Post Note  Patient: Peter Drake  Procedure(s) Performed: Procedure(s) (LRB): RIGHT TOTAL SHOULDER ARTHROPLASTY (Right)  Anesthesia type: general  Patient location: PACU  Post pain: Pain level controlled  Post assessment: Patient's Cardiovascular Status Stable  Last Vitals:  Filed Vitals:   11/02/13 1715  BP: 134/92  Pulse: 52  Temp:   Resp: 17    Post vital signs: Reviewed and stable  Level of consciousness: sedated  Complications: No apparent anesthesia complications

## 2013-11-03 ENCOUNTER — Encounter (HOSPITAL_COMMUNITY): Payer: Self-pay | Admitting: General Practice

## 2013-11-03 HISTORY — PX: TOTAL SHOULDER ARTHROPLASTY: SHX126

## 2013-11-03 MED ORDER — OXYCODONE-ACETAMINOPHEN 5-325 MG PO TABS
1.0000 | ORAL_TABLET | ORAL | Status: DC | PRN
Start: 2013-11-03 — End: 2020-09-20

## 2013-11-03 MED ORDER — DIAZEPAM 5 MG PO TABS: 2.5000 mg | ORAL_TABLET | Freq: Four times a day (QID) | ORAL | Status: DC | PRN

## 2013-11-03 NOTE — Progress Notes (Signed)
Utilization review completed.  

## 2013-11-03 NOTE — Discharge Instructions (Signed)
° °  Kevin M. Supple, M.D., F.A.A.O.S. °Orthopaedic Surgery °Specializing in Arthroscopic and Reconstructive °Surgery of the Shoulder and Knee °[phone] °[address] - , [place] [address] - Fax [phone] ° ° °POST-OP TOTAL SHOULDER REPLACEMENT/SHOULDER HEMIARTHROPLASTY INSTRUCTIONS ° °1. Call the office at [phone] to schedule your first post-op appointment 10-14 days from the date of your surgery. ° °2. The bandage over your incision is waterproof. You may begin showering with this dressing on. You may leave this dressing on until first follow up appointment within 2 weeks. If you would like to remove it you may do so after the 5th day. Go slow and tug at the borders gently to break the bond the dressing has with the skin. The steri strips may come off with the dressing. At this point if there is no drainage it is okay to go without a bandage or you may cover it with a light guaze and tape. Leave the steri-strips in place over your incision. You can expect drainage that is bloody or yellow in nature that should gradually decrease from day of surgery. Change your dressing daily until drainage is completely resolved, then you may feel free to go without a bandage. You can also expect significant bruising around your shoulder that will drift down your arm and into your chest wall. This is very normal and should resolve over several days. ° ° 3. Wear your sling/immobilizer at all times except to perform the exercises below or to occasionally let your arm dangle by your side to stretch your elbow. You also need to sleep in your sling immobilizer until instructed otherwise. ° °4. Range of motion to your elbow, wrist, and hand are encouraged 3-5 times daily. Exercise to your hand and fingers helps to reduce swelling you may experience. ° °5. Utilize ice to the shoulder 3-5 times minimum a day and additionally if you are experiencing pain. ° °6. Prescriptions for a pain medication and a muscle  relaxant are provided for you. It is recommended that if you are experiencing pain that you pain medication alone is not controlling, add the muscle relaxant along with the pain medication which can give additional pain relief. The first 1-2 days is generally the most severe of your pain and then should gradually decrease. As your pain lessens it is recommended that you decrease your use of the pain medications to an "as needed basis'" only and to always comply with the recommended dosages of the pain medications. ° °7. Pain medications can produce constipation along with their use. If you experience this, the use of an over the counter stool softener or laxative daily is recommended.  ° °8. For most patients, if insurance allows, home health services to include therapy has been arranged. ° °9. For additional questions or concerns, please do not hesitate to call the office. If after hours there is an answering service to forward your concerns to the physician on call. ° °POST-OP EXERCISES ° °Pendulum Exercises ° °Perform pendulum exercises while standing and bending at the waist. Support your uninvolved arm on a table or chair and allow your operated arm to hang freely. Make sure to do these exercises passively - not using you shoulder muscles. ° °Repeat 20 times. Do 3 sessions per day. ° ° ° ° °

## 2013-11-03 NOTE — Evaluation (Signed)
Occupational Therapy Evaluation Patient Details Name: Peter Drake MRN: [id number] DOB: 10-31-1945 Today's Date: 11/03/2013 Time: 0240-9735 OT Time Calculation (min): 56 min  OT Assessment / Plan / Recommendation History of present illness s/p R TSA   Clinical Impression   All education completed. Pt and wife returned demonstration of Dr. Susie Cassette TSA protocol and pendulum exercises as well as sling use and positioning.  Verbalized understanding of ADL modifications to assure adherence to shoulder precautions.  Pt to d/c home today.    OT Assessment  Patient does not need any further OT services    Follow Up Recommendations  No OT follow up    Barriers to Discharge      Equipment Recommendations  None recommended by OT    Recommendations for Other Services    Frequency       Precautions / Restrictions Precautions Precautions: Shoulder Type of Shoulder Precautions: Supple protocol, ADL and sling ed Shoulder Interventions: Shoulder sling/immobilizer;For comfort Precaution Booklet Issued: Yes (comment) Precaution Comments: reviewed and provided handout Required Braces or Orthoses: Sling Restrictions Weight Bearing Restrictions: Yes RUE Weight Bearing: Non weight bearing   Pertinent Vitals/Pain 3/10 R shoulder, VSS    ADL  Eating/Feeding: Independent Where Assessed - Eating/Feeding: Chair Grooming: Wash/dry hands;Teeth care;Modified independent Where Assessed - Grooming: Unsupported standing Upper Body Bathing: Modified independent Where Assessed - Upper Body Bathing: Unsupported standing Lower Body Bathing: Modified independent Where Assessed - Lower Body Bathing: Unsupported standing Upper Body Dressing: Modified independent Where Assessed - Upper Body Dressing: Unsupported sitting Lower Body Dressing: Modified independent Where Assessed - Lower Body Dressing: Unsupported sitting;Unsupported standing Toilet Transfer: Programmer, applications Method: Sit to  Loss adjuster, chartered: Comfort height toilet Toileting - Clothing Manipulation and Hygiene: Modified independent Where Assessed - Toileting Clothing Manipulation and Hygiene: Standing Transfers/Ambulation Related to ADLs: independent ADL Comments: Instructed in hemi techniques for bathing and dressing, sling donning and doffing, positioning R UE in sitting and in bed.  Performed AROM x 5 R elbow to hand, pendulum exercises with R shoulder and Dr. Susie Cassette TSA protocol exercises in supine.  Handouts given for all.    OT Diagnosis:    OT Problem List:   OT Treatment Interventions:     OT Goals(Current goals can be found in the care plan section) Acute Rehab OT Goals Patient Stated Goal: Return to golfing.  Visit Information  Last OT Received On: 11/03/13 History of Present Illness: s/p R TSA       Prior Functioning     Home Living Family/patient expects to be discharged to:: Private residence Living Arrangements: Spouse/significant other Prior Function Level of Independence: Independent Comments: like to golf Communication Communication: No difficulties Dominant Hand: Right         Vision/Perception Vision - History Baseline Vision: Wears glasses all the time   Cognition  Cognition Arousal/Alertness: Awake/alert Behavior During Therapy: WFL for tasks assessed/performed Overall Cognitive Status: Within Functional Limits for tasks assessed    Extremity/Trunk Assessment Upper Extremity Assessment Upper Extremity Assessment: RUE deficits/detail RUE Deficits / Details: s/p TSA, full AROM elbow to hand RUE: Unable to fully assess due to immobilization RUE Coordination: decreased gross motor Lower Extremity Assessment Lower Extremity Assessment: Overall WFL for tasks assessed Cervical / Trunk Assessment Cervical / Trunk Assessment: Normal     Mobility Bed Mobility Overal bed mobility: Modified Independent Transfers Overall transfer level:  Independent General transfer comment: independent     Exercise     Balance Balance Overall balance assessment: Independent  End of Session OT - End of Session Activity Tolerance: Patient tolerated treatment well Patient left: in chair;with call bell/phone within reach;with family/visitor present Nurse Communication:  (OT eval and ed completed, ready for d/c)  GO     [name] 11/03/2013, 11:45 AM [phone]

## 2013-11-03 NOTE — Discharge Summary (Signed)
PATIENT ID:      CANYON LOHR  MRN:     [id number] DOB/AGE:    1946-06-24 / 68 y.o.     DISCHARGE SUMMARY  ADMISSION DATE:    11/02/2013 DISCHARGE DATE:    ADMISSION DIAGNOSIS: OA OF RIGHT SHOULDER Past Medical History  Diagnosis Date  . 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 Dyslipidemia   . History of radiation therapy 08/04/08-09/20/08    larynx  . GERD (gastroesophageal reflux disease)   . Allergy     pcn  . Colorectal polyps 1/03  . Anxiety   . Radiation 08/04/08-09/20/08    Larynx   . Hyperlipidemia   . BPH (benign prostatic hypertrophy)     Hx: of  . Early cataracts, bilateral     Hx: of  . Arthritis     HX: of OA    DISCHARGE DIAGNOSIS:   Active Problems:   S/P shoulder replacement   PROCEDURE: Procedure(s): RIGHT TOTAL SHOULDER ARTHROPLASTY on 11/02/2013  CONSULTS:   none  HISTORY:  See H&P in chart.  HOSPITAL COURSE:  ONYEKACHI GATHRIGHT is a 68 y.o. admitted on 11/02/2013 with a chief complaint of No chief complaint on file. , and found to have a diagnosis of OA OF RIGHT SHOULDER.  They were brought to the operating room on 11/02/2013 and underwent Procedure(s): RIGHT TOTAL SHOULDER ARTHROPLASTY.    They were given perioperative antibiotics: Anti-infectives   Start     Dose/Rate Route Frequency Ordered Stop   11/02/13 1830  ceFAZolin (ANCEF) IVPB 2 g/50 mL premix     2 g 100 mL/hr over 30 Minutes Intravenous Every 6 hours 11/02/13 1813 11/03/13 0544   11/02/13 0600  ceFAZolin (ANCEF) IVPB 2 g/50 mL premix     2 g 100 mL/hr over 30 Minutes Intravenous On call to O.R. 11/01/13 1407 11/02/13 1313    .  Patient underwent the above named procedure and tolerated it well. The following day they were hemodynamically stable and pain was controlled on oral analgesics. They were neurovascularly intact to the operative extremity. OT was ordered and worked with patient per protocol. They were medically and orthopaedically stable for discharge on day 1 .      DIAGNOSTIC STUDIES:  RECENT RADIOGRAPHIC STUDIES :  No results found.  RECENT VITAL SIGNS:  Patient Vitals for the past 24 hrs:  BP Temp Temp src Pulse Resp SpO2  11/03/13 0630 95/58 mmHg - - 67 - -  11/03/13 0504 97/59 mmHg 98.4 F (36.9 C) Oral 67 18 97 %  11/03/13 0144 118/76 mmHg 99 F (37.2 C) Oral 70 18 96 %  11/02/13 2149 123/74 mmHg 98.3 F (36.8 C) Oral - 18 97 %  11/02/13 1740 142/76 mmHg 97.4 F (36.3 C) Oral 62 16 100 %  11/02/13 1715 134/92 mmHg - - 52 17 100 %  11/02/13 1700 119/75 mmHg - - 58 12 100 %  11/02/13 1645 - - - 52 11 100 %  11/02/13 1638 122/75 mmHg - - - - -  11/02/13 1630 - - - 53 12 100 %  11/02/13 1623 126/77 mmHg - - - - -  11/02/13 1615 - - - 56 10 99 %  11/02/13 1608 124/82 mmHg - - - - -  11/02/13 1600 - - - 52 12 100 %  11/02/13 1553 135/97 mmHg - - - - -  11/02/13 1545 - - - 55 18 100 %  11/02/13  1530 127/79 mmHg - - 61 10 100 %  11/02/13 1523 135/72 mmHg 97.4 F (36.3 C) - 68 15 100 %  11/02/13 1134 - - - 72 16 100 %  11/02/13 1133 - - - 73 18 98 %  11/02/13 1132 121/88 mmHg - - 68 12 99 %  11/02/13 1131 - - - 65 13 99 %  11/02/13 1130 - - - 65 13 99 %  11/02/13 1129 - - - 67 16 99 %  11/02/13 1128 - - - 72 10 99 %  11/02/13 1127 117/90 mmHg - - 73 14 100 %  11/02/13 1126 - - - 72 16 99 %  11/02/13 1125 - - - 70 12 99 %  11/02/13 1124 - - - 72 10 100 %  11/02/13 1123 - - - 70 10 99 %  11/02/13 1122 133/81 mmHg - - 70 10 99 %  11/02/13 1121 - - - 72 16 100 %  11/02/13 1120 - - - 70 12 100 %  11/02/13 1119 - - - 72 9 99 %  11/02/13 1118 - - - 74 10 99 %  11/02/13 1117 154/89 mmHg - - 69 15 98 %  11/02/13 1116 - - - 75 14 100 %  11/02/13 1115 - - - 72 16 100 %  11/02/13 1114 - - - 71 13 100 %  11/02/13 1113 - - - 69 14 100 %  11/02/13 1112 125/90 mmHg - - 68 12 100 %  11/02/13 1049 144/94 mmHg 98.3 F (36.8 C) Oral 72 20 100 %  .  RECENT EKG RESULTS:   No orders found for this or any previous visit.  DISCHARGE  INSTRUCTIONS:   Future Appointments Provider Department Dept Phone   12/29/2013 8:45 AM Lora Paula, MD [facility] Radiation Oncology [phone]      DISCHARGE MEDICATIONS:     Medication List         diazepam 5 MG tablet  Commonly known as:  VALIUM  Take 0.5-1 tablets (2.5-5 mg total) by mouth every 6 (six) hours as needed for muscle spasms or sedation.     HYDROcodone-acetaminophen 5-325 MG per tablet  Commonly known as:  NORCO/VICODIN  Take 1 tablet by mouth every 4 (four) hours as needed for moderate pain or severe pain.     meloxicam 7.5 MG tablet  Commonly known as:  MOBIC  Take 7.5 mg by mouth 2 (two) times daily as needed for pain.     mometasone 50 MCG/ACT nasal spray  Commonly known as:  NASONEX  Place 2 sprays into the nose daily as needed. congestion     oxyCODONE-acetaminophen 5-325 MG per tablet  Commonly known as:  PERCOCET  Take 1-2 tablets by mouth every 4 (four) hours as needed.     simvastatin 20 MG tablet  Commonly known as:  ZOCOR  Take 20 mg by mouth daily.     tamsulosin 0.4 MG Caps capsule  Commonly known as:  FLOMAX  Take 0.4 mg by mouth daily.        FOLLOW UP VISIT:       Follow-up Information   Follow up with Marin Shutter, MD. (call to be seen in 10-14 days)    Specialty:  Orthopedic Surgery   Contact information:   [address] [place] [address] [phone]       DISCHARGE TO: home  DISPOSITION: good  DISCHARGE CONDITION:  Festus Barren  for Dr. Justice Britain 11/03/2013, 10:24 AM

## 2013-11-03 NOTE — Op Note (Signed)
NAMEPARVIN, STETZER NO.:  [id number]  MEDICAL RECORD NO.:  [id number]  LOCATION:  5N09C                        FACILITY:  [facility]  PHYSICIAN:  Metta Clines. Lilburn Straw, M.D.  DATE OF BIRTH:  Sep 13, 1946  DATE OF PROCEDURE:  11/02/2013 DATE OF DISCHARGE:                              OPERATIVE REPORT   PREOPERATIVE DIAGNOSIS:  End-stage right shoulder glenohumeral osteoarthritis.  POSTOPERATIVE DIAGNOSIS:  End-stage right shoulder glenohumeral osteoarthritis.  PROCEDURE:  Right total shoulder arthroplasty utilizing a Press-Fit size 12 [employer] stem with a 52 x 21 eccentric head and a cemented pegged 48 glenoid.  SURGEON:  Metta Clines. Johneric Mcfadden, M.D.  Terrence DupontOlivia Mackie A. Shuford, P.A.-C.  ANESTHESIA:  General endotracheal as well as an interscalene block.  ESTIMATED BLOOD LOSS:  250 mL.  DRAINS:  None.  HISTORY:  Mr. Lares is a 68 year old gentleman who has had chronic and progressive increasing right shoulder pain with functional limitations related to end-stage glenohumeral arthrosis.  His symptoms have deteriorated to the point where he is having significant functional limitations and is brought to the operating room at this time for planned right shoulder arthroscopy as described below.  Preoperatively, I counseled Mr. Heckard on treatment options as well as risks versus benefits thereof.  Possible surgical complications were reviewed including the potential for bleeding, infection, neurovascular injury, persistent pain, loss of motion, anesthetic complications, failure of the implant, and possible need for additional surgery.  He understands and accepts and agrees with our planned procedure.  PROCEDURE IN DETAIL:  After undergoing routine preop evaluation, the patient received prophylactic antibiotics and an interscalene block was established in the holding area by the Anesthesia Department.  Placed supine on the operating table, underwent smooth  induction of general endotracheal anesthesia.  Placed in the beach-chair position and appropriately padded and protected.  The right shoulder girdle region was sterilely prepped and draped in standard fashion.  Time-out was called.  An anterior approach to the right shoulder was made through a deltopectoral incision, approximately 12 cm in length, centered at the beginning at the coracoid and extending laterally and distally.  Skin flaps were elevated and electrocautery was used for hemostasis.  The deltopectoral interval was identified.  The deltoid retracted laterally along with the cephalic vein.  Pectoralis major medially and dissection carried deeply releasing adhesions beneath the deltoid and then tenotomizing the upper 1.5 cm to the pectoralis major to improve exposure.  Conjoined tendon was identified, mobilized and retracted medially.  The biceps tendon was then identified.  The bicipital groove was unroofed.  The biceps tendon was tenotomized for later tenodesis. We then split the rotator cuff from apex of the bicipital groove to the base of the coracoid and subscapularis was then divided away from the lesser tuberosity with electrocautery and free margin was tagged with a series of #2 FiberWire sutures and a grasping technique.  We then divided the anterior inferior capsule and divided the capsular attachments away from the inferior aspect of the humeral head, allowing delivery of the head completely through the wound.  The rotator cuff was carefully protected superiorly and posteriorly.  An oscillating saw was then used to perform a humeral head osteotomy  using the oscillating saw. We then used a rongeur to remove the peripheral osteophytes from the anteroinferior and posterior aspect of the metaphysis.  We gained access to the humeral medullary canal, then performed hand reaming up to size 14.  We used a cookie cutter broach at 30 degrees of retroversion and broached it up to  size 14 with excellent fit and fixation.  At this point, a metal cap was placed towards the cut surface of the proximal humerus and we then exposed the glenoid using combination of pitchfork, snake tongue and Fukuda retractors.  I divided the residual proximal stump of the biceps and resected the labrum circumferentially allowing circumferential exposure of the glenoid and also released the anterior capsule such that the subscapularis was completely mobilized.  Guidepin was then placed into the center of the glenoid and this was then reamed with a 48 reamer, which showed the best fit over the native glenoid. This was reamed to a stable subchondral bony bed.  I then drilled our central peg hole and then the peripheral peg holes, and the trial glenoid showed excellent fit.  The glenoid was irrigated, dried.  Cement was mixed in the appropriate consistency, was introduced into the peripheral peg holes using the cement gun.  Final 48 glenoid was then impacted into position with excellent fit and fixation.  After the cement had hardened, we then returned our attention to the humerus where the canal was cleaned.  Size 14 stem was then introduced into the humeral shaft using abundant bone graft, resected from the head to bone graft and metaphyseal portion of our implant and this showed excellent fit and fixation.  We then performed some trial reductions and ultimately, the 52 x 21 eccentric head showed the best soft tissue balance, allowing 50% translation across the glenoid.  At this point, the [facility] taper was cleaned and dried.  The final 52 x 21 eccentric head was impacted into position.  Final reduction was performed.  The overall position, soft tissue balance was much to our satisfaction.  At this point, the subcuticular layer was then repaired back to the lesser tuberosity with the #2 FiberWires and also closed the lateral 2-3 cm of the rotator interval with figure-of-eight #2 FiberWire  sutures.  The biceps tendon was then tenodesed at the upper level of the pectoralis major using the #2 FiberWire, and the residual proximal stump was then divided and excised.  The wound was then copiously irrigated. Hemostasis was obtained.  We confirmed there was excellent shoulder motion with no evidence for excessive tension on the subscapularis with up to 45 degrees of external rotation.  The deltopectoral interval was then reapproximated using figure-of-eight #1 Vicryl sutures.  2-0 Vicryl was used for the subcu layer and intracuticular 3-0 Monocryl for the skin followed by Steri-Strips.  Dry dressing was placed over the right shoulder.  Right arm was placed in a sling.  The patient was awakened, extubated, and taken to the recovery room in stable condition.  Jenetta Loges, PA-C was used as an Environmental consultant throughout this case and essential for help with positioning of extremity, prepping and draping, retraction, tissue manipulation, implantation of prosthesis, wound closure, and intraoperative decision making.     Metta Clines. Aliayah Tyer, M.D.     KMS/MEDQ  D:  11/02/2013  T:  11/03/2013  Job:  [id number]

## 2013-11-04 ENCOUNTER — Encounter (HOSPITAL_COMMUNITY): Payer: Self-pay | Admitting: Orthopedic Surgery

## 2013-12-26 ENCOUNTER — Other Ambulatory Visit: Payer: Self-pay | Admitting: Dermatology

## 2013-12-29 ENCOUNTER — Ambulatory Visit (HOSPITAL_COMMUNITY)
Admission: RE | Admit: 2013-12-29 | Discharge: 2013-12-29 | Disposition: A | Discharge status: Home / Self Care | Payer: Medicare Other | Source: Ambulatory Visit | Attending: Radiation Oncology | Admitting: Radiation Oncology

## 2013-12-29 ENCOUNTER — Encounter: Payer: Self-pay | Admitting: Radiation Oncology

## 2013-12-29 ENCOUNTER — Ambulatory Visit
Admission: RE | Admit: 2013-12-29 | Discharge: 2013-12-29 | Disposition: A | Discharge status: Home / Self Care | Payer: Medicare Other | Source: Ambulatory Visit | Attending: Radiation Oncology | Admitting: Radiation Oncology

## 2013-12-29 VITALS — BP 109/70 | HR 79 | Temp 98.0°F | Resp 16 | Wt 181.8 lb

## 2013-12-29 DIAGNOSIS — T66XXXA Radiation sickness, unspecified, initial encounter: Secondary | ICD-10-CM

## 2013-12-29 DIAGNOSIS — Z79899 Other long term (current) drug therapy: Secondary | ICD-10-CM | POA: Insufficient documentation

## 2013-12-29 DIAGNOSIS — C329 Malignant neoplasm of larynx, unspecified: Secondary | ICD-10-CM

## 2013-12-29 DIAGNOSIS — Z923 Personal history of irradiation: Secondary | ICD-10-CM | POA: Insufficient documentation

## 2013-12-29 DIAGNOSIS — C320 Malignant neoplasm of glottis: Secondary | ICD-10-CM | POA: Insufficient documentation

## 2013-12-29 DIAGNOSIS — Z880 Allergy status to penicillin: Secondary | ICD-10-CM | POA: Insufficient documentation

## 2013-12-29 LAB — TSH CHCC: TSH: 2.001 m(IU)/L (ref 0.320–4.118)

## 2013-12-29 MED ORDER — LARYNGOSCOPY SOLUTION RAD-ONC
15.0000 mL | Freq: Once | TOPICAL | Status: AC
  Administered 2013-12-29: 15 mL via TOPICAL
  Filled 2013-12-29: qty 15

## 2013-12-29 NOTE — Progress Notes (Signed)
Reports intermittent hoarseness mostly in the morning upon waking but, occasionally in the late afternoon. Reports a dry cough. Denies dry mouth or difficulty swallowing. Weight stable. Reports appetite is good. In January patient had a total right shoulder replacement surgery and continues to recover from that. Reports he attends PT twice per week. Reports taking hydrocodone and mobic to relieve right shoulder pain following PT. Denies nausea, vomiting, headache, dizziness, or diarrhea. Last TSH was 2014.

## 2013-12-29 NOTE — Addendum Note (Signed)
Encounter addended by: Lora Paula, MD on: 12/29/2013 10:12 AM<BR>     Documentation filed: Problem List

## 2013-12-29 NOTE — Addendum Note (Signed)
Encounter addended by: Acquanetta Belling, [facility] on: 12/29/2013 10:36 AM<BR>     Documentation filed: Rx Order Verification

## 2013-12-29 NOTE — Progress Notes (Signed)
Quick Note:  Please call patient with normal result.  Thanks. MM ______ 

## 2013-12-29 NOTE — Progress Notes (Signed)
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 He denies hoarseness, otalgia, odonyphagia, and dysphagia. He is following with Dr. Wilburn Drake.  He had an orthopedic procedure a few months ago for his shoulder which went well.                              ALLERGIES:  is allergic to penicillins.  Meds: Current Outpatient Prescriptions  Medication Sig Dispense Refill  . diazepam (VALIUM) 5 MG tablet Take 0.5-1 tablets (2.5-5 mg total) by mouth every 6 (six) hours as needed for muscle spasms or sedation.  40 tablet  1  . HYDROcodone-acetaminophen (NORCO/VICODIN) 5-325 MG per tablet Take 1 tablet by mouth every 4 (four) hours as needed for moderate pain or severe pain.       . meloxicam (MOBIC) 7.5 MG tablet Take 7.5 mg by mouth 2 (two) times daily as needed for pain.       . simvastatin (ZOCOR) 20 MG tablet Take 20 mg by mouth daily.      . Tamsulosin HCl (FLOMAX) 0.4 MG CAPS Take 0.4 mg by mouth daily.       . mometasone (NASONEX) 50 MCG/ACT nasal spray Place 2 sprays into the nose daily as needed. congestion      . oxyCODONE-acetaminophen (PERCOCET) 5-325 MG per tablet Take 1-2 tablets by mouth every 4 (four) hours as needed.  60 tablet  0   Current Facility-Administered Medications  Medication Dose Route Frequency Provider Last Rate Last Dose  . laryngocopy solution for Rad-Onc  15 mL Topical Once Peter Paula, MD        Physical Findings: The patient is in no acute distress. Patient is alert and oriented.  weight is 181 lb 12.8 oz  (82.464 kg). His oral temperature is 98 F (36.7 C). His blood pressure is 109/70 and his pulse is 79. His respiration is 16 and oxygen saturation is 100%. Marland Kitchen His neck is free of any appreciable lymphadenopathy or significant telangiectasia. The oral cavity contains good dentition with no mucosal irregularities. He does have some injection of the right tonsillar mucosa which is asymmetric, and likely represents URI related reaction.  Indirect mirror exam elicited a gag.   Fiberoptic Laryngoscopy Procedure   Nasal anesthesia was performed with Lidocaine/Neosynephrine spray.  Then, the fiberoptic scope was introduced into the right nostril and advanced providing visualization of the nasopharynx, posterior oropharynx, and larynx. The true vocal cords were pale and smooth intending symmetrically upon phonation. The mucosal surface of the cords does show telangiectatic changes consistent with post radiation effect.  Pyriform sinuses and vallecula were patent. WNL.   Lab Findings: Lab Results  Component Value Date   WBC 5.6 10/25/2013   HGB 15.4 10/25/2013   HCT 44.5 10/25/2013   MCV 91.6 10/25/2013   PLT 166 10/25/2013  Results for Peter Drake (MRN [id number]) as of 12/29/2013 08:30  Ref. Range 12/02/2012 11:41  TSH Latest Range: 0.350-4.500 uIU/mL 2.096   Radiographic Findings: Results  for Peter Drake (MRN [id number]) as of 12/29/2013 08:30  Ref. Range 12/02/2012 12:02  DG CHEST 2 VIEW CHEST - 2 VIEW   Comparison: 11/06/2011   Findings: The heart size and mediastinal contours are within normal  limits. Both lungs are clear. The visualized skeletal structures  are unremarkable.   IMPRESSION:   Negative exam.   Original Report Authenticated By: Kerby Moors, M.D.  Rpt   Impression:  The patient is NED 5 years s/p definitive radiotherapy for stage I glottic cancer.  Plan:  Check annual post-radiation TSH and chest X-ray today.  If these are normal, the patient is released from further  follow-up.  I will entrust his annual TSH and chest X-ray to his primary care physician, [name].  The patient will follow-up prn with myself and Dr. Wilburn Drake in the future.  _____________________________________  Sheral Apley Tammi Klippel, M.D.

## 2013-12-30 ENCOUNTER — Telehealth: Payer: Self-pay | Admitting: Radiation Oncology

## 2013-12-30 NOTE — Telephone Encounter (Signed)
Phoned patient's home. Wife answered. Explained her husband is at therapy. Per Dr. Johny Shears order explained chest xray and tsh is normal. She verbalized understanding and expressed appreciation for the call.

## 2014-07-18 ENCOUNTER — Other Ambulatory Visit: Payer: Self-pay | Admitting: Gastroenterology

## 2018-11-01 ENCOUNTER — Other Ambulatory Visit (HOSPITAL_COMMUNITY): Payer: Self-pay | Admitting: Gastroenterology

## 2018-12-21 ENCOUNTER — Encounter (HOSPITAL_COMMUNITY): Payer: Self-pay | Admitting: *Deleted

## 2018-12-22 ENCOUNTER — Encounter (HOSPITAL_COMMUNITY): Payer: Self-pay | Admitting: Anesthesiology

## 2018-12-22 NOTE — Anesthesia Preprocedure Evaluation (Addendum)
Anesthesia Evaluation  Patient identified by MRN, date of birth, ID band Patient awake    Reviewed: Allergy & Precautions, NPO status , Patient's Chart, lab work & pertinent test results  Airway Mallampati: I  TM Distance: >3 FB Neck ROM: Full    Dental no notable dental hx. (+) Teeth Intact   Pulmonary neg pulmonary ROS, former smoker,    Pulmonary exam normal breath sounds clear to auscultation       Cardiovascular negative cardio ROS Normal cardiovascular exam Rhythm:Regular Rate:Normal     Neuro/Psych negative neurological ROS  negative psych ROS   GI/Hepatic Neg liver ROS, GERD  Medicated and Controlled,Hx/o colon polyps   Endo/Other  Hyperlipidemia  Renal/GU negative Renal ROS   BPH    Musculoskeletal  (+) Arthritis , Osteoarthritis,    Abdominal   Peds  Hematology negative hematology ROS (+)   Anesthesia Other Findings   Reproductive/Obstetrics                            Anesthesia Physical Anesthesia Plan  ASA: II  Anesthesia Plan: MAC   Post-op Pain Management:    Induction: Intravenous  PONV Risk Score and Plan: Ondansetron, Propofol infusion and Treatment may vary due to age or medical condition  Airway Management Planned: Natural Airway, Nasal Cannula and Simple Face Mask  Additional Equipment:   Intra-op Plan:   Post-operative Plan:   Informed Consent: I have reviewed the patients History and Physical, chart, labs and discussed the procedure including the risks, benefits and alternatives for the proposed anesthesia with the patient or authorized representative who has indicated his/her understanding and acceptance.     Dental advisory given  Plan Discussed with: CRNA and Surgeon  Anesthesia Plan Comments:        Anesthesia Quick Evaluation

## 2018-12-23 ENCOUNTER — Ambulatory Visit (HOSPITAL_COMMUNITY)
Admission: RE | Admit: 2018-12-23 | Discharge: 2018-12-23 | Disposition: A | Discharge status: Home / Self Care | Payer: Medicare Other | Attending: Gastroenterology | Admitting: Gastroenterology

## 2018-12-23 ENCOUNTER — Other Ambulatory Visit: Payer: Self-pay

## 2018-12-23 ENCOUNTER — Encounter (HOSPITAL_COMMUNITY)
Admission: RE | Disposition: A | Discharge status: Home / Self Care | Payer: Self-pay | Source: Home / Self Care | Attending: Gastroenterology

## 2018-12-23 ENCOUNTER — Encounter (HOSPITAL_COMMUNITY): Payer: Self-pay | Admitting: Certified Registered Nurse Anesthetist

## 2018-12-23 ENCOUNTER — Ambulatory Visit (HOSPITAL_COMMUNITY): Payer: Medicare Other | Admitting: Anesthesiology

## 2018-12-23 DIAGNOSIS — Z923 Personal history of irradiation: Secondary | ICD-10-CM | POA: Insufficient documentation

## 2018-12-23 DIAGNOSIS — D123 Benign neoplasm of transverse colon: Secondary | ICD-10-CM | POA: Diagnosis not present

## 2018-12-23 DIAGNOSIS — Z96611 Presence of right artificial shoulder joint: Secondary | ICD-10-CM | POA: Diagnosis not present

## 2018-12-23 DIAGNOSIS — D120 Benign neoplasm of cecum: Secondary | ICD-10-CM | POA: Diagnosis not present

## 2018-12-23 DIAGNOSIS — Z87891 Personal history of nicotine dependence: Secondary | ICD-10-CM | POA: Diagnosis not present

## 2018-12-23 DIAGNOSIS — Z79899 Other long term (current) drug therapy: Secondary | ICD-10-CM | POA: Diagnosis not present

## 2018-12-23 DIAGNOSIS — K2970 Gastritis, unspecified, without bleeding: Secondary | ICD-10-CM | POA: Diagnosis not present

## 2018-12-23 DIAGNOSIS — D122 Benign neoplasm of ascending colon: Secondary | ICD-10-CM | POA: Diagnosis not present

## 2018-12-23 DIAGNOSIS — K449 Diaphragmatic hernia without obstruction or gangrene: Secondary | ICD-10-CM | POA: Insufficient documentation

## 2018-12-23 DIAGNOSIS — Z800 Family history of malignant neoplasm of digestive organs: Secondary | ICD-10-CM | POA: Diagnosis not present

## 2018-12-23 DIAGNOSIS — K621 Rectal polyp: Secondary | ICD-10-CM | POA: Insufficient documentation

## 2018-12-23 DIAGNOSIS — K219 Gastro-esophageal reflux disease without esophagitis: Secondary | ICD-10-CM | POA: Diagnosis not present

## 2018-12-23 DIAGNOSIS — Z1211 Encounter for screening for malignant neoplasm of colon: Secondary | ICD-10-CM | POA: Insufficient documentation

## 2018-12-23 HISTORY — PX: POLYPECTOMY: SHX5525

## 2018-12-23 HISTORY — PX: ESOPHAGOGASTRODUODENOSCOPY (EGD) WITH PROPOFOL: SHX5813

## 2018-12-23 HISTORY — PX: COLONOSCOPY WITH PROPOFOL: SHX5780

## 2018-12-23 SURGERY — COLONOSCOPY WITH PROPOFOL
Anesthesia: Monitor Anesthesia Care

## 2018-12-23 MED ORDER — SODIUM CHLORIDE 0.9 % IV SOLN: INTRAVENOUS | Status: DC

## 2018-12-23 MED ORDER — PROPOFOL 10 MG/ML IV BOLUS
INTRAVENOUS | Status: AC
  Filled 2018-12-23: qty 20

## 2018-12-23 MED ORDER — PROPOFOL 500 MG/50ML IV EMUL
INTRAVENOUS | Status: DC | PRN
  Administered 2018-12-23: 150 ug/kg/min via INTRAVENOUS

## 2018-12-23 MED ORDER — PROPOFOL 500 MG/50ML IV EMUL
INTRAVENOUS | Status: DC | PRN
  Administered 2018-12-23 (×2): 30 mg via INTRAVENOUS

## 2018-12-23 MED ORDER — PROPOFOL 10 MG/ML IV BOLUS
INTRAVENOUS | Status: AC
  Filled 2018-12-23: qty 40

## 2018-12-23 MED ORDER — EPHEDRINE SULFATE 50 MG/ML IJ SOLN
INTRAMUSCULAR | Status: DC | PRN
  Administered 2018-12-23 (×2): 10 mg via INTRAVENOUS

## 2018-12-23 MED ORDER — LACTATED RINGERS IV SOLN
INTRAVENOUS | Status: DC | PRN
  Administered 2018-12-23: 08:00:00 via INTRAVENOUS

## 2018-12-23 SURGICAL SUPPLY — 21 items

## 2018-12-23 NOTE — Op Note (Signed)
[facility] Patient Name: Peter Drake Procedure Date: 12/23/2018 MRN: [id number] Attending MD: Juanita Craver , MD Date of Birth: 03/22/1946 CSN: [id number] Age: 73 Admit Type: Outpatient Procedure:                Colonoscopy with cold biopsies & snare polypectomy                            x 2. Indications:              Family history of colon cancer-sister; CRC                            screening for colorectal malignant neoplasm. Providers:                Juanita Craver, MD, Carmie End, RN, Cherylynn Ridges, Technician, Herbie Drape, CRNA. Referring MD:             Gaynelle Arabian, MD Medicines:                Monitored Anesthesia Care. Complications:            No immediate complications. Estimated Blood Loss:     Estimated blood loss was minimal. Procedure:                Pre-Anesthesia Assessment: - Prior to the                            procedure, a history and physical was performed,                            and patient medications and allergies were                            reviewed. The patient's tolerance of previous                            anesthesia was also reviewed. The risks and                            benefits of the procedure and the sedation options                            and risks were discussed with the patient. All                            questions were answered, and informed consent was                            obtained. Prior Anticoagulants: The patient has                            taken no previous anticoagulant or antiplatelet  agents. ASA Grade Assessment: II - A patient with                            mild systemic disease. After reviewing the risks                            and benefits, the patient was deemed in                            satisfactory condition to undergo the procedure.                            After obtaining informed consent, the colonoscope                             was passed under direct vision. Throughout the                            procedure, the patient's blood pressure, pulse, and                            oxygen saturations were monitored continuously. The                            [id number]-HQ190L ([id number]) Olympus colonoscope was                            introduced through the anus and advanced to the the                            cecum, identified by appendiceal orifice and                            ileocecal valve. The colonoscopy was performed                            without difficulty. The patient tolerated the                            procedure well. The quality of the bowel                            preparation was adequate. The ileocecal valve, the                            appendiceal orifice and the rectum were                            photographed. The bowel preparation used was                            GoLYTELY. Scope In: 7:46:26 AM Scope Out: 8:05:19 AM Scope Withdrawal Time: 0 hours 7 minutes 34 seconds  Total Procedure Duration: 0 hours 18 minutes 53  seconds  Findings:      Three small sessile polyps were found, 1 in the rectum and 2 in the       mid-ascending colon-these were removed by cold biopsies.      A 7 mm sessile polyp was found in the mid-transverse colon; the polyp       was removed with a cold snare x 1; resection and retrieval were complete.      A 9 mm sessile polyp was found in the cecum; the polyp was removed with       a hot snare x 1-200/20; resection and retrieval were complete.      A few small and large-mouthed diverticula were found in the sigmoid       colon.      Small internal hemorrhoids were noted on retroflexion. Impression:               - Three small sessile polyps, 1 in the rectum and 2                            in the mid-ascending colon-removed by cold biopsies.                           - One 7 mm sessile polyp in the mid-transverse                             colon, removed with a cold snare x 1; resected and                            retrieved.                           - One 9 mm sessile polyp in the cecum, removed with                            a hot snare x 1; resected and retrieved.                           - Diverticulosis in the sigmoid colon.                           - Small internal hemorrhoids. Moderate Sedation:      MAC used. Recommendation:           - High fiber diet with augmented water consumption                            daily.                           - Continue present medications; avoid all NSAIDS                            for 2 weeks .                           - Await pathology results.                           -  Repeat colonoscopy is not recommended due to                            patients age.                           - Return to my office on a PRN basis.                           - If the patient has any abnormal GI symptoms in                            the interim, she/he has been advised to call the                            office ASAP for further recommendations. Procedure Code(s):        --- Professional ---                           [phone], Colonoscopy, flexible; with removal of                            tumor(s), polyp(s), or other lesion(s) by snare                            technique Diagnosis Code(s):        --- Professional ---                           D12.0, Benign neoplasm of cecum                           D12.3, Benign neoplasm of transverse colon (hepatic                            flexure or splenic flexure)                           Z80.0, Family history of malignant neoplasm of                            digestive organs                           Z12.11, Encounter for screening for malignant                            neoplasm of colon                           K57.30, Diverticulosis of large intestine without                            perforation or abscess without  bleeding  D12.2, Benign neoplasm of ascending colon                           D12.8, Benign neoplasm of rectum CPT copyright 2018 American Medical Association. All rights reserved. The codes documented in this report are preliminary and upon coder review may  be revised to meet current compliance requirements. Juanita Craver, MD Juanita Craver, MD 12/23/2018 8:33:02 AM This report has been signed electronically. Number of Addenda: 0

## 2018-12-23 NOTE — Discharge Instructions (Signed)

## 2018-12-23 NOTE — Transfer of Care (Signed)
Immediate Anesthesia Transfer of Care Note  Patient: Peter Drake  Procedure(s) Performed: COLONOSCOPY WITH PROPOFOL (N/A ) ESOPHAGOGASTRODUODENOSCOPY (EGD) WITH PROPOFOL (N/A )  Patient Location: PACU  Anesthesia Type:MAC  Level of Consciousness: sedated, patient cooperative and responds to stimulation  Airway & Oxygen Therapy: Patient Spontanous Breathing and Patient connected to nasal cannula oxygen  Post-op Assessment: Report given to RN and Post -op Vital signs reviewed and stable  Post vital signs: Reviewed and stable  Last Vitals:  Vitals Value Taken Time  BP    Temp    Pulse    Resp    SpO2      Last Pain:  Vitals:   12/23/18 0658  TempSrc:   PainSc: 0-No pain         Complications: No apparent anesthesia complications

## 2018-12-23 NOTE — H&P (Signed)
Peter Drake is an 73 y.o. male.   Chief Complaint: Colorectal cancer screening. HPI: 73 year old white male here for a screening colonoscopy. His sister died of colon cancer at the age of 20. She was diagnosed at the age of 77.   Past Medical History:  Diagnosis Date  . Allergy    pcn  . Anxiety   . Arthritis    HX: of OA  . BPH (benign prostatic hypertrophy)    Hx: of  . Cancer ([place]) 06/23/08   L vocoal cord sq cell ca  . Colorectal polyps 1/03  . Dyslipidemia   . Early cataracts, bilateral    Hx: of  . GERD (gastroesophageal reflux disease)   . History of radiation therapy 08/04/08-09/20/08   larynx  . Hyperlipidemia   . Radiation 08/04/08-09/20/08   Larynx    Past Surgical History:  Procedure Laterality Date  . basal cell excisions     by dr. hall  . CERVICAL SPINE SURGERY    . COLONOSCOPY W/ BIOPSIES AND POLYPECTOMY     Hx: of  . FINGER SURGERY     right fifth  . INGUINAL HERNIA REPAIR   03/2005   left   . left knee arthoscopy     x 2  . NASAL SINUS SURGERY  1990's  . RADIAL KERATOTOMY     20 years ago/ bi lateral  . TONSILLECTOMY     as a child  . TOTAL SHOULDER ARTHROPLASTY Right 11/03/2013   DR SUPPLE  . TOTAL SHOULDER ARTHROPLASTY Right 11/02/2013   Procedure: RIGHT TOTAL SHOULDER ARTHROPLASTY;  Surgeon: Marin Shutter, MD;  Location: [facility];  Service: Orthopedics;  Laterality: Right;   Family History  Problem Relation Age of Onset  . Heart disease Mother   . COPD Father   . Rectal cancer Sister   . Cancer Sister        rectal  . Melanoma Daughter 68       treated   Social History:  reports that he has quit smoking. His smoking use included cigarettes. He has a 15.00 pack-year smoking history. He has quit using smokeless tobacco. He reports current alcohol use. He reports that he does not use drugs.  Allergies:  Allergies  Allergen Reactions  . Penicillins Hives    Did it involve swelling of the face/tongue/throat, SOB, or low BP? Unknown Did it  involve sudden or severe rash/hives, skin peeling, or any reaction on the inside of your mouth or nose? Unknown Did you need to seek medical attention at a hospital or doctor's office? Unknown When did it last happen?Childhood allergy If all above answers are "NO", may proceed with cephalosporin use.    Medications Prior to Admission  Medication Sig Dispense Refill  . pantoprazole (PROTONIX) 40 MG tablet Take 40 mg by mouth daily.    . simvastatin (ZOCOR) 20 MG tablet Take 20 mg by mouth daily.    . Tamsulosin HCl (FLOMAX) 0.4 MG CAPS Take 0.4 mg by mouth 2 (two) times daily.     . diazepam (VALIUM) 5 MG tablet Take 0.5-1 tablets (2.5-5 mg total) by mouth every 6 (six) hours as needed for muscle spasms or sedation. (Patient not taking: Reported on 12/15/2018) 40 tablet 1  . oxyCODONE-acetaminophen (PERCOCET) 5-325 MG per tablet Take 1-2 tablets by mouth every 4 (four) hours as needed. (Patient not taking: Reported on 12/15/2018) 60 tablet 0    No results found for this or any previous visit (from  the past 48 hour(s)). No results found.  Review of Systems  Constitutional: Negative.   HENT: Negative.   Eyes: Negative.   Respiratory: Negative.   Cardiovascular: Negative.   Gastrointestinal: Positive for heartburn. Negative for abdominal pain, blood in stool, constipation, diarrhea, melena, nausea and vomiting.  Genitourinary: Negative.   Musculoskeletal: Negative.   Skin: Negative.   Neurological: Negative.   Endo/Heme/Allergies: Negative.   Psychiatric/Behavioral: Negative.    Blood pressure (!) 160/90, pulse 64, temperature 98.1 F (36.7 C), temperature source Oral, resp. rate 12, height 5\' 9"  (1.753 m), weight 79.4 kg, SpO2 100 %. Physical Exam  Constitutional: He is oriented to person, place, and time. He appears well-developed and well-nourished.  HENT:  Head: Normocephalic and atraumatic.  Eyes: Pupils are equal, round, and reactive to light. Conjunctivae are normal.   Neck: Normal range of motion. Neck supple.  Cardiovascular: Normal rate and regular rhythm.  Respiratory: Effort normal.  GI: Soft. Bowel sounds are normal.  Musculoskeletal: Normal range of motion.  Neurological: He is alert and oriented to person, place, and time.  Skin: Skin is warm and dry.  Psychiatric: He has a normal mood and affect. His behavior is normal. Judgment and thought content normal.    Assessment/Plan Colorectal cancer screening/Family history of colon cancer-proceed with a colonoscopy at this time.  Peter Wiest, MD 12/23/2018, 7:18 AM

## 2018-12-23 NOTE — Op Note (Signed)
[facility] Patient Name: Peter Drake Procedure Date: 12/23/2018 MRN: [id number] Attending MD: Juanita Craver , MD Date of Birth: 1946/07/07 CSN: [id number] Age: 73 Admit Type: Outpatient Procedure:                Diagnostic EGD. Indications:              Gastro-esophageal reflux disease. Providers:                Juanita Craver, MD, Carmie End, RN, Cherylynn Ridges, Technician, Herbie Drape, CRNA Referring MD:             Gaynelle Arabian, MD Medicines:                Monitored Anesthesia Care Complications:            No immediate complications. Estimated Blood Loss:     Estimated blood loss: none. Procedure:                Pre-Anesthesia Assessment: - Prior to the                            procedure, a history and physical was performed,                            and patient medications and allergies were                            reviewed. The patient's tolerance of previous                            anesthesia was also reviewed. The risks and                            benefits of the procedure and the sedation options                            and risks were discussed with the patient. All                            questions were answered, and informed consent was                            obtained. Prior Anticoagulants: The patient has                            taken no previous anticoagulant or antiplatelet                            agents. ASA Grade Assessment: II - A patient with                            mild systemic disease. After reviewing the risks  and benefits, the patient was deemed in                            satisfactory condition to undergo the procedure.                            After obtaining informed consent, the endoscope was                            passed under direct vision. Throughout the                            procedure, the patient's blood pressure, pulse, and                      oxygen saturations were monitored continuously. The                            [id number]-[id number] ([id number] Olympus endoscope was introduced                            through the mouth, and advanced to the second part                            of duodenum. The EGD was accomplished without                            difficulty. The patient tolerated the procedure                            well. Scope In: Scope Out: Findings:      The examined esophagus and the GEJ appeared wideluy patent and normal.      Diffuse mild inflammation characterized by friability and granularity       was found in the entire examined stomach.      A small hiatal hernia was noted on retroflexion.      The examined duodenum was normal. Impression:               - Normal appearing, widely patent esophagus and GEJ.                           - Mild diffuse gastritis.                           - Small hiatal hernia.                           - Normal examined duodenum.                           - No specimens collected. Moderate Sedation:      MAC used. Recommendation:           - High fiber diet with augmented water consumption  daily.                           - Continue present medications.                           - Return to my office PRN.                           - If the patient has any abnormal GI symptoms in                            the interim, he has been advised to call the office                            ASAP for further recommendations. Procedure Code(s):        --- Professional ---                           [phone], Esophagogastroduodenoscopy, flexible,                            transoral; diagnostic, including collection of                            specimen(s) by brushing or washing, when performed                            (separate procedure) Diagnosis Code(s):        --- Professional ---                           K21.9, Gastro-esophageal reflux  disease without                            esophagitis                           K44.9, Diaphragmatic hernia without obstruction or                            gangrene                           K29.70, Gastritis, unspecified, without bleeding CPT copyright 2018 American Medical Association. All rights reserved. The codes documented in this report are preliminary and upon coder review may  be revised to meet current compliance requirements. Juanita Craver, MD Juanita Craver, MD 12/23/2018 8:16:32 AM This report has been signed electronically. Number of Addenda: 0

## 2018-12-23 NOTE — Anesthesia Postprocedure Evaluation (Signed)
Anesthesia Post Note  Patient: Peter Drake  Procedure(s) Performed: COLONOSCOPY WITH PROPOFOL (N/A ) ESOPHAGOGASTRODUODENOSCOPY (EGD) WITH PROPOFOL (N/A ) POLYPECTOMY     Patient location during evaluation: PACU Anesthesia Type: MAC Level of consciousness: awake and alert and oriented Pain management: pain level controlled Vital Signs Assessment: post-procedure vital signs reviewed and stable Respiratory status: spontaneous breathing, nonlabored ventilation and respiratory function stable Cardiovascular status: stable and blood pressure returned to baseline Postop Assessment: no apparent nausea or vomiting Anesthetic complications: no    Last Vitals:  Vitals:   12/23/18 0817 12/23/18 0820  BP: (!) 98/46 (!) 99/57  Pulse: 76 76  Resp: 18 15  Temp: 36.4 C   SpO2: 92% 97%    Last Pain:  Vitals:   12/23/18 0820  TempSrc:   PainSc: 0-No pain                 Dyanna Seiter A.

## 2018-12-25 ENCOUNTER — Encounter (HOSPITAL_COMMUNITY): Payer: Self-pay | Admitting: Gastroenterology

## 2019-06-08 ENCOUNTER — Other Ambulatory Visit: Payer: Self-pay

## 2019-06-08 DIAGNOSIS — Z20822 Contact with and (suspected) exposure to covid-19: Secondary | ICD-10-CM

## 2019-06-09 LAB — NOVEL CORONAVIRUS, NAA: SARS-CoV-2, NAA: NOT DETECTED

## 2019-11-03 ENCOUNTER — Inpatient Hospital Stay: Admit: 2019-11-03 | Payer: Medicare Other | Admitting: Orthopedic Surgery

## 2019-11-03 SURGERY — ARTHROPLASTY, SHOULDER, TOTAL
Anesthesia: General | Site: Shoulder | Laterality: Left

## 2019-11-12 ENCOUNTER — Ambulatory Visit: Payer: Medicare PPO | Attending: Internal Medicine

## 2019-11-12 DIAGNOSIS — Z23 Encounter for immunization: Secondary | ICD-10-CM | POA: Insufficient documentation

## 2019-11-12 NOTE — Progress Notes (Signed)
   Covid-19 Vaccination Clinic  Name:  ADNAN GECK    MRN: [id number] DOB: [birth year]  11/12/2019  Mr. Ebel was observed post Covid-19 immunization for 15 minutes without incidence. He was provided with Vaccine Information Sheet and instruction to access the V-Safe system.   Mr. Roorda was instructed to call 911 with any severe reactions post vaccine: Marland Kitchen Difficulty breathing  . Swelling of your face and throat  . A fast heartbeat  . A bad rash all over your body  . Dizziness and weakness    Immunizations Administered    Name Date Dose VIS Date Route   Pfizer COVID-19 Vaccine 11/12/2019  2:26 PM 0.3 mL 09/30/2019 Intramuscular   Manufacturer: [place]   Lot: [id number]   [place]: [id number]

## 2019-11-29 ENCOUNTER — Other Ambulatory Visit (HOSPITAL_COMMUNITY): Payer: Self-pay | Admitting: Ophthalmology

## 2019-11-29 DIAGNOSIS — G453 Amaurosis fugax: Secondary | ICD-10-CM

## 2019-12-01 ENCOUNTER — Ambulatory Visit (HOSPITAL_COMMUNITY): Admission: RE | Admit: 2019-12-01 | Payer: Medicare PPO | Source: Ambulatory Visit

## 2019-12-01 ENCOUNTER — Encounter (HOSPITAL_COMMUNITY): Payer: Self-pay

## 2019-12-01 ENCOUNTER — Ambulatory Visit (HOSPITAL_COMMUNITY): Payer: Medicare PPO

## 2019-12-03 ENCOUNTER — Ambulatory Visit: Payer: Medicare PPO | Attending: Internal Medicine

## 2019-12-03 DIAGNOSIS — Z23 Encounter for immunization: Secondary | ICD-10-CM | POA: Insufficient documentation

## 2019-12-03 NOTE — Progress Notes (Signed)
   Covid-19 Vaccination Clinic  Name:  Peter Drake    MRN: [id number] DOB: Sep 17, 1946  12/03/2019  [name] was observed post Covid-19 immunization for 15 minutes without incidence. He was provided with Vaccine Information Sheet and instruction to access the V-Safe system.   [name] was instructed to call 911 with any severe reactions post vaccine: Marland Kitchen Difficulty breathing  . Swelling of your face and throat  . A fast heartbeat  . A bad rash all over your body  . Dizziness and weakness    Immunizations Administered    Name Date Dose VIS Date Route   Pfizer COVID-19 Vaccine 12/03/2019  1:30 PM 0.3 mL 09/30/2019 Intramuscular   Manufacturer: [place]   Lot: [id number]   [place]: [id number]

## 2019-12-19 ENCOUNTER — Ambulatory Visit (HOSPITAL_COMMUNITY)
Admission: RE | Admit: 2019-12-19 | Discharge: 2019-12-19 | Disposition: A | Discharge status: Home / Self Care | Payer: Medicare PPO | Source: Ambulatory Visit | Attending: Cardiovascular Disease | Admitting: Cardiovascular Disease

## 2019-12-19 ENCOUNTER — Other Ambulatory Visit: Payer: Self-pay

## 2019-12-19 ENCOUNTER — Ambulatory Visit (HOSPITAL_BASED_OUTPATIENT_CLINIC_OR_DEPARTMENT_OTHER): Payer: Medicare PPO

## 2019-12-19 DIAGNOSIS — G453 Amaurosis fugax: Secondary | ICD-10-CM | POA: Diagnosis present

## 2020-07-06 ENCOUNTER — Other Ambulatory Visit: Payer: Self-pay | Admitting: Neurological Surgery

## 2020-07-06 DIAGNOSIS — M5412 Radiculopathy, cervical region: Secondary | ICD-10-CM

## 2020-07-28 ENCOUNTER — Ambulatory Visit
Admission: RE | Admit: 2020-07-28 | Discharge: 2020-07-28 | Disposition: A | Discharge status: Home / Self Care | Payer: Medicare PPO | Source: Ambulatory Visit | Attending: Neurological Surgery | Admitting: Neurological Surgery

## 2020-07-28 ENCOUNTER — Other Ambulatory Visit: Payer: Medicare PPO

## 2020-07-28 DIAGNOSIS — M5412 Radiculopathy, cervical region: Secondary | ICD-10-CM

## 2020-09-24 NOTE — Patient Instructions (Addendum)
DUE TO COVID-19 ONLY ONE VISITOR IS ALLOWED TO COME WITH YOU AND STAY IN THE WAITING ROOM ONLY DURING PRE OP AND PROCEDURE DAY OF SURGERY. THE 1 VISITOR  MAY VISIT WITH YOU AFTER SURGERY IN YOUR PRIVATE ROOM DURING VISITING HOURS ONLY!  YOU NEED TO HAVE A COVID 19 TEST ON__12-13-21_____ @_______ , THIS TEST MUST BE DONE BEFORE SURGERY,  COVID TESTING SITE [address] [place] [address], IT IS ON THE RIGHT GOING OUT [address] APPROXIMATELY  2 MINUTES PAST [employer] ON THE RIGHT. ONCE YOUR COVID TEST IS COMPLETED,  PLEASE BEGIN THE QUARANTINE INSTRUCTIONS AS OUTLINED IN YOUR HANDOUT.                KOA ZOELLER  09/24/2020   Your procedure is scheduled on: 10-04-20   Report to [facility] Main  Entrance   Report to short stay   at        0530 AM     Call this number if you have problems the morning of surgery [phone]    Remember: NO SOLID FOOD AFTER MIDNIGHT THE NIGHT PRIOR TO SURGERY. NOTHING BY MOUTH EXCEPT CLEAR LIQUIDS UNTIL      0430 am . PLEASE FINISH ENSURE DRINK PER SURGEON ORDER  WHICH NEEDS TO BE COMPLETED AT     0430 am then nothing by mouth .    CLEAR LIQUID DIET  Until 0430 am the morning of your surgery then nothing by mouth   Foods Allowed                                                                                   water black  Coffee and tea, regular and decaf                                       Plain Jell-O any favor except red or purple                                           Fruit ices (not with fruit pulp)                                                          Iced Popsicles                                                        Carbonated beverages, regular and diet                                    Cranberry,  grape and apple juices Sports drinks like Gatorade Lightly seasoned clear broth or consume(fat free) Sugar, honey  syrup   _____________________________________________________________________     BRUSH YOUR TEETH MORNING OF SURGERY AND RINSE YOUR MOUTH OUT, NO CHEWING GUM CANDY OR MINTS.     Take these medicines the morning of surgery with A SIP OF WATER: flomax, protonix, zocor                                 You may not have any metal on your body including hair pins and              piercings  Do not wear jewelry,  lotions, powders or perfumes, deodorant                       Men may shave face and neck.   Do not bring valuables to the hospital. [place].  Contacts, dentures or bridgework may not be worn into surgery.      Patients discharged the day of surgery will not be allowed to drive home. IF YOU ARE HAVING SURGERY AND GOING HOME THE SAME DAY, YOU MUST HAVE AN ADULT TO DRIVE YOU HOME AND BE WITH YOU FOR 24 HOURS. YOU MAY GO HOME BY TAXI OR UBER OR ORTHERWISE, BUT AN ADULT MUST ACCOMPANY YOU HOME AND STAY WITH YOU FOR 24 HOURS.  Name and phone number of your driver:  Special Instructions: N/A              Please read over the following fact sheets you were given: _____________________________________________________________________ [facility]- Preparing for Total Shoulder Arthroplasty    Before surgery, you can play an important role. Because skin is not sterile, your skin needs to be as free of germs as possible. You can reduce the number of germs on your skin by using the following products. . Benzoyl Peroxide Gel o Reduces the number of germs present on the skin o Applied twice a day to shoulder area starting two days before surgery    ==================================================================  Please follow these instructions carefully:  BENZOYL PEROXIDE 5% GEL  Please do not use if you have an allergy to benzoyl peroxide.   If your skin becomes reddened/irritated stop using the benzoyl peroxide.  Starting two  days before surgery, apply as follows: 1. Apply benzoyl peroxide in the morning and at night. Apply after taking a shower. If you are not taking a shower clean entire shoulder front, back, and side along with the armpit with a clean wet washcloth.  2. Place a quarter-sized dollop on your shoulder and rub in thoroughly, making sure to cover the front, back, and side of your shoulder, along with the armpit.   2 days before ____ AM   ____ PM              1 day before ____ AM   ____ PM                         3. Do this twice a day for two days.  (Last application is the night before surgery, AFTER using the CHG soap as described below).  4. Do NOT apply benzoyl peroxide gel on the day of surgery.  [place] - Preparing for Surgery Before surgery, you can play an important role.  Because skin is not sterile, your skin needs to be as free of germs as possible.  You can reduce the number of germs on your skin by washing with CHG (chlorahexidine gluconate) soap before surgery.  CHG is an antiseptic cleaner which kills germs and bonds with the skin to continue killing germs even after washing. Please DO NOT use if you have an allergy to CHG or antibacterial soaps.  If your skin becomes reddened/irritated stop using the CHG and inform your nurse when you arrive at Short Stay. Do not shave (including legs and underarms) for at least 48 hours prior to the first CHG shower.  You may shave your face/neck. Please follow these instructions carefully:  1.  Shower with CHG Soap the night before surgery and the  morning of Surgery.  2.  If you choose to wash your hair, wash your hair first as usual with your  normal  shampoo.  3.  After you shampoo, rinse your hair and body thoroughly to remove the  shampoo.                           4.  Use CHG as you would any other liquid soap.  You can apply chg directly  to the skin and wash                       Gently with a scrungie or clean washcloth.  5.   Apply the CHG Soap to your body ONLY FROM THE NECK DOWN.   Do not use on face/ open                           Wound or open sores. Avoid contact with eyes, ears mouth and genitals (private parts).                       Wash face,  Genitals (private parts) with your normal soap.             6.  Wash thoroughly, paying special attention to the area where your surgery  will be performed.  7.  Thoroughly rinse your body with warm water from the neck down.  8.  DO NOT shower/wash with your normal soap after using and rinsing off  the CHG Soap.                9.  Pat yourself dry with a clean towel.            10.  Wear clean pajamas.            11.  Place clean sheets on your bed the night of your first shower and do not  sleep with pets. Day of Surgery : Do not apply any lotions/deodorants the morning of surgery.  Please wear clean clothes to the hospital/surgery center.  FAILURE TO FOLLOW THESE INSTRUCTIONS MAY RESULT IN THE CANCELLATION OF YOUR SURGERY PATIENT SIGNATURE_________________________________  NURSE SIGNATURE__________________________________  ________________________________________________________________________   Adam Phenix  An incentive spirometer is a tool that can help keep your lungs clear and active. This tool measures how well you are filling your lungs with each breath. Taking long deep breaths may help reverse or decrease the chance of developing breathing (pulmonary) problems (especially infection) following:  A long period of time  when you are unable to move or be active. BEFORE THE PROCEDURE   If the spirometer includes an indicator to show your best effort, your nurse or respiratory therapist will set it to a desired goal.  If possible, sit up straight or lean slightly forward. Try not to slouch.  Hold the incentive spirometer in an upright position. INSTRUCTIONS FOR USE  1. Sit on the edge of your bed if possible, or sit up as far as you can in bed  or on a chair. 2. Hold the incentive spirometer in an upright position. 3. Breathe out normally. 4. Place the mouthpiece in your mouth and seal your lips tightly around it. 5. Breathe in slowly and as deeply as possible, raising the piston or the ball toward the top of the column. 6. Hold your breath for 3-5 seconds or for as long as possible. Allow the piston or ball to fall to the bottom of the column. 7. Remove the mouthpiece from your mouth and breathe out normally. 8. Rest for a few seconds and repeat Steps 1 through 7 at least 10 times every 1-2 hours when you are awake. Take your time and take a few normal breaths between deep breaths. 9. The spirometer may include an indicator to show your best effort. Use the indicator as a goal to work toward during each repetition. 10. After each set of 10 deep breaths, practice coughing to be sure your lungs are clear. If you have an incision (the cut made at the time of surgery), support your incision when coughing by placing a pillow or rolled up towels firmly against it. Once you are able to get out of bed, walk around indoors and cough well. You may stop using the incentive spirometer when instructed by your caregiver.  RISKS AND COMPLICATIONS  Take your time so you do not get dizzy or light-headed.  If you are in pain, you may need to take or ask for pain medication before doing incentive spirometry. It is harder to take a deep breath if you are having pain. AFTER USE  Rest and breathe slowly and easily.  It can be helpful to keep track of a log of your progress. Your caregiver can provide you with a simple table to help with this. If you are using the spirometer at home, follow these instructions: [place] IF:   You are having difficultly using the spirometer.  You have trouble using the spirometer as often as instructed.  Your pain medication is not giving enough relief while using the spirometer.  You develop fever of 100.5  F (38.1 C) or higher. SEEK IMMEDIATE MEDICAL CARE IF:   You cough up bloody sputum that had not been present before.  You develop fever of 102 F (38.9 C) or greater.  You develop worsening pain at or near the incision site. MAKE SURE YOU:   Understand these instructions.  Will watch your condition.  Will get help right away if you are not doing well or get worse. Document Released: 02/16/2007 Document Revised: 12/29/2011 Document Reviewed: 04/19/2007 [employer] Patient Information 2014 [place], [place].   ________________________________________________________________________

## 2020-09-24 NOTE — Progress Notes (Addendum)
PCP - Domenick Gong, MD Cardiologist - no   PPM/ICD -  Device Orders -  Rep Notified -   Chest x-ray -  EKG -  Stress Test -  ECHO - 12-2019 epic Cardiac Cath -   Sleep Study -  CPAP -   Fasting Blood Sugar -  Checks Blood Sugar _____ times a day  Blood Thinner Instructions: Aspirin Instructions:  ERAS Protcol - PRE-SURGERY Ensure or G2-   COVID TEST- 12-13  Activity- Able to walk a flight of stairs without SOB Anesthesia review:   Patient denies shortness of breath, fever, cough and chest pain at PAT appointment  NONE   All instructions explained to the patient, with a verbal understanding of the material. Patient agrees to go over the instructions while at home for a better understanding. Patient also instructed to self quarantine after being tested for COVID-19. The opportunity to ask questions was provided.

## 2020-09-26 ENCOUNTER — Encounter (HOSPITAL_COMMUNITY): Payer: Self-pay

## 2020-09-26 ENCOUNTER — Encounter (HOSPITAL_COMMUNITY)
Admission: RE | Admit: 2020-09-26 | Discharge: 2020-09-26 | Disposition: A | Discharge status: Home / Self Care | Payer: Medicare PPO | Source: Ambulatory Visit | Attending: Orthopedic Surgery | Admitting: Orthopedic Surgery

## 2020-09-26 ENCOUNTER — Other Ambulatory Visit: Payer: Self-pay

## 2020-09-26 DIAGNOSIS — Z01812 Encounter for preprocedural laboratory examination: Secondary | ICD-10-CM | POA: Insufficient documentation

## 2020-09-26 HISTORY — DX: Sleep apnea, unspecified: G47.30

## 2020-09-26 HISTORY — DX: Amaurosis fugax: G45.3

## 2020-09-26 HISTORY — DX: Ventricular premature depolarization: I49.3

## 2020-09-26 HISTORY — DX: Headache, unspecified: R51.9

## 2020-09-26 LAB — CBC
HCT: 45.9 % (ref 39.0–52.0)
Hemoglobin: 15.3 g/dL (ref 13.0–17.0)
MCH: 31.2 pg (ref 26.0–34.0)
MCHC: 33.3 g/dL (ref 30.0–36.0)
MCV: 93.5 fL (ref 80.0–100.0)
Platelets: 192 10*3/uL (ref 150–400)
RBC: 4.91 MIL/uL (ref 4.22–5.81)
RDW: 12.4 % (ref 11.5–15.5)
WBC: 5.1 10*3/uL (ref 4.0–10.5)
nRBC: 0 % (ref 0.0–0.2)

## 2020-09-26 LAB — BASIC METABOLIC PANEL
Anion gap: 4 — ABNORMAL LOW (ref 5–15)
BUN: 10 mg/dL (ref 8–23)
CO2: 30 mmol/L (ref 22–32)
Calcium: 9.2 mg/dL (ref 8.9–10.3)
Chloride: 103 mmol/L (ref 98–111)
Creatinine, Ser: 0.68 mg/dL (ref 0.61–1.24)
GFR, Estimated: >60 mL/min (ref >60)
Glucose, Bld: 101 mg/dL — ABNORMAL HIGH (ref 70–99)
Potassium: 4.6 mmol/L (ref 3.5–5.1)
Sodium: 137 mmol/L (ref 135–145)

## 2020-09-26 LAB — SURGICAL PCR SCREEN
MRSA, PCR: NEGATIVE
Staphylococcus aureus: NEGATIVE

## 2020-10-01 ENCOUNTER — Other Ambulatory Visit (HOSPITAL_COMMUNITY)
Admission: RE | Admit: 2020-10-01 | Discharge: 2020-10-01 | Disposition: A | Discharge status: Home / Self Care | Payer: Medicare PPO | Source: Ambulatory Visit | Attending: Orthopedic Surgery | Admitting: Orthopedic Surgery

## 2020-10-01 DIAGNOSIS — Z01812 Encounter for preprocedural laboratory examination: Secondary | ICD-10-CM | POA: Insufficient documentation

## 2020-10-01 DIAGNOSIS — Z20822 Contact with and (suspected) exposure to covid-19: Secondary | ICD-10-CM | POA: Insufficient documentation

## 2020-10-01 LAB — SARS CORONAVIRUS 2 (TAT 6-24 HRS): SARS Coronavirus 2: NEGATIVE

## 2020-10-03 NOTE — Anesthesia Preprocedure Evaluation (Addendum)
Anesthesia Evaluation  Patient identified by MRN, date of birth, ID band Patient awake    Reviewed: Patient's Chart, lab work & pertinent test results  Airway Mallampati: II  TM Distance: >3 FB Neck ROM: Full    Dental  (+) Teeth Intact   Pulmonary neg pulmonary ROS, sleep apnea , former smoker,    Pulmonary exam normal        Cardiovascular negative cardio ROS   Rhythm:Regular Rate:Normal     Neuro/Psych  Headaches, Anxiety    GI/Hepatic Neg liver ROS, GERD  Medicated,  Endo/Other  negative endocrine ROS  Renal/GU negative Renal ROS   BPH    Musculoskeletal  (+) Arthritis , Osteoarthritis,    Abdominal (+)  Abdomen: soft. Bowel sounds: normal.  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   Anesthesia Other Findings   Reproductive/Obstetrics                            Anesthesia Physical Anesthesia Plan  ASA: III  Anesthesia Plan: General and Regional   Post-op Pain Management: GA combined w/ Regional for post-op pain   Induction: Intravenous  PONV Risk Score and Plan: 2 and Ondansetron, Dexamethasone and Treatment may vary due to age or medical condition  Airway Management Planned: Mask and Oral ETT  Additional Equipment: None  Intra-op Plan:   Post-operative Plan: Extubation in OR  Informed Consent: I have reviewed the patients History and Physical, chart, labs and discussed the procedure including the risks, benefits and alternatives for the proposed anesthesia with the patient or authorized representative who has indicated his/her understanding and acceptance.     Dental advisory given  Plan Discussed with: CRNA  Anesthesia Plan Comments: (Lab Results      Component                Value               Date                      WBC                      5.1                 09/26/2020                HGB                      15.3                 09/26/2020                HCT                      45.9                09/26/2020                MCV                      93.5                09/26/2020                PLT  192                 09/26/2020           Lab Results      Component                Value               Date                      NA                       137                 09/26/2020                K                        4.6                 09/26/2020                CO2                      30                  09/26/2020                GLUCOSE                  101 (H)             09/26/2020                BUN                      10                  09/26/2020                CREATININE               0.68                09/26/2020                CALCIUM                  9.2                 09/26/2020                GFRNONAA                 >60                 09/26/2020                GFRAA                    >90                 11/02/2013          )        Anesthesia Quick Evaluation

## 2020-10-04 ENCOUNTER — Ambulatory Visit (HOSPITAL_COMMUNITY): Payer: Medicare PPO | Admitting: Anesthesiology

## 2020-10-04 ENCOUNTER — Other Ambulatory Visit: Payer: Self-pay

## 2020-10-04 ENCOUNTER — Encounter (HOSPITAL_COMMUNITY): Payer: Self-pay | Admitting: Orthopedic Surgery

## 2020-10-04 ENCOUNTER — Encounter (HOSPITAL_COMMUNITY)
Admission: RE | Disposition: A | Discharge status: Home / Self Care | Payer: Self-pay | Source: Home / Self Care | Attending: Orthopedic Surgery

## 2020-10-04 ENCOUNTER — Ambulatory Visit (HOSPITAL_COMMUNITY)
Admission: RE | Admit: 2020-10-04 | Discharge: 2020-10-04 | Disposition: A | Discharge status: Home / Self Care | Payer: Medicare PPO | Attending: Orthopedic Surgery | Admitting: Orthopedic Surgery

## 2020-10-04 DIAGNOSIS — Z96611 Presence of right artificial shoulder joint: Secondary | ICD-10-CM | POA: Diagnosis not present

## 2020-10-04 DIAGNOSIS — Z79899 Other long term (current) drug therapy: Secondary | ICD-10-CM | POA: Diagnosis not present

## 2020-10-04 DIAGNOSIS — M19012 Primary osteoarthritis, left shoulder: Secondary | ICD-10-CM | POA: Insufficient documentation

## 2020-10-04 DIAGNOSIS — Z87891 Personal history of nicotine dependence: Secondary | ICD-10-CM | POA: Diagnosis not present

## 2020-10-04 HISTORY — PX: TOTAL SHOULDER ARTHROPLASTY: SHX126

## 2020-10-04 SURGERY — ARTHROPLASTY, SHOULDER, TOTAL
Anesthesia: Regional | Site: Shoulder | Laterality: Left

## 2020-10-04 MED ORDER — LACTATED RINGERS IV SOLN: INTRAVENOUS | Status: DC

## 2020-10-04 MED ORDER — MIDAZOLAM HCL 5 MG/5ML IJ SOLN
INTRAMUSCULAR | Status: DC | PRN
  Administered 2020-10-04: 1 mg via INTRAVENOUS

## 2020-10-04 MED ORDER — PHENYLEPHRINE 40 MCG/ML (10ML) SYRINGE FOR IV PUSH (FOR BLOOD PRESSURE SUPPORT)
PREFILLED_SYRINGE | INTRAVENOUS | Status: DC | PRN
  Administered 2020-10-04: 280 ug via INTRAVENOUS
  Administered 2020-10-04: 120 ug via INTRAVENOUS

## 2020-10-04 MED ORDER — ONDANSETRON HCL 4 MG PO TABS: 4.0000 mg | ORAL_TABLET | Freq: Three times a day (TID) | ORAL | 0 refills | Status: DC | PRN

## 2020-10-04 MED ORDER — ONDANSETRON HCL 4 MG/2ML IJ SOLN: 4.0000 mg | Freq: Four times a day (QID) | INTRAMUSCULAR | Status: DC | PRN

## 2020-10-04 MED ORDER — OXYCODONE-ACETAMINOPHEN 5-325 MG PO TABS: 1.0000 | ORAL_TABLET | ORAL | 0 refills | Status: DC | PRN

## 2020-10-04 MED ORDER — EPHEDRINE 5 MG/ML INJ
INTRAVENOUS | Status: AC
  Filled 2020-10-04: qty 10

## 2020-10-04 MED ORDER — ROCURONIUM BROMIDE 100 MG/10ML IV SOLN
INTRAVENOUS | Status: DC | PRN
  Administered 2020-10-04 (×2): 10 mg via INTRAVENOUS
  Administered 2020-10-04: 20 mg via INTRAVENOUS
  Administered 2020-10-04: 60 mg via INTRAVENOUS

## 2020-10-04 MED ORDER — BUPIVACAINE HCL (PF) 0.5 % IJ SOLN
INTRAMUSCULAR | Status: DC | PRN
Start: 2020-10-04 — End: 2020-10-04
  Administered 2020-10-04: 15 mL via PERINEURAL

## 2020-10-04 MED ORDER — ONDANSETRON HCL 4 MG/2ML IJ SOLN: 4.0000 mg | Freq: Once | INTRAMUSCULAR | Status: DC | PRN

## 2020-10-04 MED ORDER — ONDANSETRON HCL 4 MG/2ML IJ SOLN
INTRAMUSCULAR | Status: DC | PRN
  Administered 2020-10-04: 4 mg via INTRAVENOUS

## 2020-10-04 MED ORDER — DEXAMETHASONE SODIUM PHOSPHATE 10 MG/ML IJ SOLN
INTRAMUSCULAR | Status: AC
  Filled 2020-10-04: qty 1

## 2020-10-04 MED ORDER — HYDROMORPHONE HCL 1 MG/ML IJ SOLN: 0.2500 mg | INTRAMUSCULAR | Status: DC | PRN

## 2020-10-04 MED ORDER — EPHEDRINE SULFATE-NACL 50-0.9 MG/10ML-% IV SOSY
PREFILLED_SYRINGE | INTRAVENOUS | Status: DC | PRN
  Administered 2020-10-04 (×3): 10 mg via INTRAVENOUS

## 2020-10-04 MED ORDER — ROCURONIUM BROMIDE 10 MG/ML (PF) SYRINGE
PREFILLED_SYRINGE | INTRAVENOUS | Status: AC
  Filled 2020-10-04: qty 10

## 2020-10-04 MED ORDER — CEFAZOLIN SODIUM-DEXTROSE 2-4 GM/100ML-% IV SOLN
INTRAVENOUS | Status: AC
  Filled 2020-10-04: qty 100

## 2020-10-04 MED ORDER — TRAMADOL HCL 50 MG PO TABS: 50.0000 mg | ORAL_TABLET | Freq: Every day | ORAL | 0 refills | Status: DC | PRN

## 2020-10-04 MED ORDER — ORAL CARE MOUTH RINSE: 15.0000 mL | Freq: Once | OROMUCOSAL | Status: AC

## 2020-10-04 MED ORDER — PHENYLEPHRINE 40 MCG/ML (10ML) SYRINGE FOR IV PUSH (FOR BLOOD PRESSURE SUPPORT)
PREFILLED_SYRINGE | INTRAVENOUS | Status: AC
  Filled 2020-10-04: qty 10

## 2020-10-04 MED ORDER — CHLORHEXIDINE GLUCONATE 0.12 % MT SOLN
15.0000 mL | Freq: Once | OROMUCOSAL | Status: AC
  Administered 2020-10-04: 06:00:00 15 mL via OROMUCOSAL

## 2020-10-04 MED ORDER — FENTANYL CITRATE (PF) 100 MCG/2ML IJ SOLN
INTRAMUSCULAR | Status: AC
  Filled 2020-10-04: qty 2

## 2020-10-04 MED ORDER — BUPIVACAINE LIPOSOME 1.3 % IJ SUSP
INTRAMUSCULAR | Status: DC | PRN
  Administered 2020-10-04: 10 mL via PERINEURAL

## 2020-10-04 MED ORDER — PROPOFOL 10 MG/ML IV BOLUS
INTRAVENOUS | Status: AC
  Filled 2020-10-04: qty 20

## 2020-10-04 MED ORDER — MIDAZOLAM HCL 2 MG/2ML IJ SOLN
INTRAMUSCULAR | Status: AC
  Filled 2020-10-04: qty 2

## 2020-10-04 MED ORDER — ACETAMINOPHEN 10 MG/ML IV SOLN: 1000.0000 mg | Freq: Once | INTRAVENOUS | Status: DC | PRN

## 2020-10-04 MED ORDER — FENTANYL CITRATE (PF) 100 MCG/2ML IJ SOLN
INTRAMUSCULAR | Status: DC | PRN
  Administered 2020-10-04 (×2): 50 ug via INTRAVENOUS

## 2020-10-04 MED ORDER — DEXAMETHASONE SODIUM PHOSPHATE 10 MG/ML IJ SOLN
INTRAMUSCULAR | Status: DC | PRN
  Administered 2020-10-04: 5 mg via INTRAVENOUS

## 2020-10-04 MED ORDER — LACTATED RINGERS IV BOLUS
500.0000 mL | Freq: Once | INTRAVENOUS | Status: AC
  Administered 2020-10-04: 10:00:00 500 mL via INTRAVENOUS

## 2020-10-04 MED ORDER — TRANEXAMIC ACID-NACL 1000-0.7 MG/100ML-% IV SOLN
1000.0000 mg | INTRAVENOUS | Status: AC
  Administered 2020-10-04: 08:00:00 1000 mg via INTRAVENOUS

## 2020-10-04 MED ORDER — ONDANSETRON HCL 4 MG PO TABS
4.0000 mg | ORAL_TABLET | Freq: Four times a day (QID) | ORAL | Status: DC | PRN
  Filled 2020-10-04: qty 1

## 2020-10-04 MED ORDER — SUGAMMADEX SODIUM 200 MG/2ML IV SOLN
INTRAVENOUS | Status: DC | PRN
  Administered 2020-10-04: 150 mg via INTRAVENOUS

## 2020-10-04 MED ORDER — GLYCOPYRROLATE PF 0.2 MG/ML IJ SOSY
PREFILLED_SYRINGE | INTRAMUSCULAR | Status: AC
  Filled 2020-10-04: qty 1

## 2020-10-04 MED ORDER — METOCLOPRAMIDE HCL 5 MG PO TABS
5.0000 mg | ORAL_TABLET | Freq: Three times a day (TID) | ORAL | Status: DC | PRN
  Filled 2020-10-04: qty 2

## 2020-10-04 MED ORDER — STERILE WATER FOR IRRIGATION IR SOLN
Status: DC | PRN
  Administered 2020-10-04: 2000 mL

## 2020-10-04 MED ORDER — PHENYLEPHRINE HCL (PRESSORS) 10 MG/ML IV SOLN
INTRAVENOUS | Status: AC
  Filled 2020-10-04: qty 1

## 2020-10-04 MED ORDER — LIDOCAINE HCL (PF) 2 % IJ SOLN
INTRAMUSCULAR | Status: AC
  Filled 2020-10-04: qty 5

## 2020-10-04 MED ORDER — LACTATED RINGERS IV BOLUS
250.0000 mL | Freq: Once | INTRAVENOUS | Status: AC
  Administered 2020-10-04: 11:00:00 250 mL via INTRAVENOUS

## 2020-10-04 MED ORDER — PROPOFOL 10 MG/ML IV BOLUS
INTRAVENOUS | Status: DC | PRN
  Administered 2020-10-04: 170 mg via INTRAVENOUS

## 2020-10-04 MED ORDER — METOCLOPRAMIDE HCL 5 MG/ML IJ SOLN: 5.0000 mg | Freq: Three times a day (TID) | INTRAMUSCULAR | Status: DC | PRN

## 2020-10-04 MED ORDER — 0.9 % SODIUM CHLORIDE (POUR BTL) OPTIME
TOPICAL | Status: DC | PRN
  Administered 2020-10-04: 08:00:00 1000 mL

## 2020-10-04 MED ORDER — ONDANSETRON HCL 4 MG/2ML IJ SOLN
INTRAMUSCULAR | Status: AC
  Filled 2020-10-04: qty 2

## 2020-10-04 MED ORDER — PHENYLEPHRINE HCL-NACL 10-0.9 MG/250ML-% IV SOLN
INTRAVENOUS | Status: DC | PRN
  Administered 2020-10-04: 50 ug/min via INTRAVENOUS
  Administered 2020-10-04: 150 ug/min via INTRAVENOUS

## 2020-10-04 MED ORDER — TRANEXAMIC ACID-NACL 1000-0.7 MG/100ML-% IV SOLN
INTRAVENOUS | Status: AC
  Filled 2020-10-04: qty 100

## 2020-10-04 MED ORDER — CEFAZOLIN SODIUM-DEXTROSE 2-4 GM/100ML-% IV SOLN
2.0000 g | INTRAVENOUS | Status: AC
  Administered 2020-10-04: 08:00:00 2 g via INTRAVENOUS

## 2020-10-04 MED ORDER — CYCLOBENZAPRINE HCL 10 MG PO TABS: 10.0000 mg | ORAL_TABLET | Freq: Three times a day (TID) | ORAL | 1 refills | Status: DC | PRN

## 2020-10-04 SURGICAL SUPPLY — 66 items
BAG ZIPLOCK 12X15 (MISCELLANEOUS) ×3 IMPLANT
BIT DRILL 2.0X128 (BIT) ×2 IMPLANT
BIT DRILL 2.0X128MM (BIT) ×1
BLADE SAW SGTL 83.5X18.5 (BLADE) ×3 IMPLANT
BODY TRUNION ECLIPSE 47 SL (Shoulder) ×3 IMPLANT
CEMENT BONE DEPUY (Cement) ×3 IMPLANT
COOLER ICEMAN CLASSIC (MISCELLANEOUS) IMPLANT
COVER BACK TABLE 60X90IN (DRAPES) ×3 IMPLANT
COVER SURGICAL LIGHT HANDLE (MISCELLANEOUS) ×3 IMPLANT
COVER WAND RF STERILE (DRAPES) IMPLANT
DERMABOND ADVANCED (GAUZE/BANDAGES/DRESSINGS) ×2
DERMABOND ADVANCED .7 DNX12 (GAUZE/BANDAGES/DRESSINGS) ×1 IMPLANT
DRAPE ORTHO SPLIT 77X108 STRL (DRAPES) ×6
DRAPE SHEET LG 3/4 BI-LAMINATE (DRAPES) ×3 IMPLANT
DRAPE SURG 17X11 SM STRL (DRAPES) ×3 IMPLANT
DRAPE SURG ORHT 6 SPLT 77X108 (DRAPES) ×2 IMPLANT
DRAPE U-SHAPE 47X51 STRL (DRAPES) ×3 IMPLANT
DRESSING AQUACEL AG SP 3.5X6 (GAUZE/BANDAGES/DRESSINGS) ×1 IMPLANT
DRSG AQUACEL AG ADV 3.5X10 (GAUZE/BANDAGES/DRESSINGS) ×3 IMPLANT
DRSG AQUACEL AG SP 3.5X6 (GAUZE/BANDAGES/DRESSINGS) ×3
DURAPREP 26ML APPLICATOR (WOUND CARE) ×3 IMPLANT
ELECT BLADE TIP CTD 4 INCH (ELECTRODE) ×3 IMPLANT
ELECT REM PT RETURN 15FT ADLT (MISCELLANEOUS) ×3 IMPLANT
FACESHIELD WRAPAROUND (MASK) ×12 IMPLANT
GLENOID UNI VAULTLOCK LRG (Shoulder) ×3 IMPLANT
GLOVE BIO SURGEON STRL SZ7.5 (GLOVE) ×3 IMPLANT
GLOVE BIO SURGEON STRL SZ8 (GLOVE) ×3 IMPLANT
GLOVE SS BIOGEL STRL SZ 7 (GLOVE) ×1 IMPLANT
GLOVE SS BIOGEL STRL SZ 7.5 (GLOVE) ×1 IMPLANT
GLOVE SUPERSENSE BIOGEL SZ 7 (GLOVE) ×2
GLOVE SUPERSENSE BIOGEL SZ 7.5 (GLOVE) ×2
GOWN STRL REUS W/TWL LRG LVL3 (GOWN DISPOSABLE) ×6 IMPLANT
HEAD HUMERAL ECLIPSE 47/18 (Shoulder) ×3 IMPLANT
IMPL ECLIPSE SPEEDCAP (Shoulder) ×1 IMPLANT
IMPLANT ECLIPSE SPEEDCAP (Shoulder) ×3 IMPLANT
KIT BASIN OR (CUSTOM PROCEDURE TRAY) ×3 IMPLANT
KIT TURNOVER KIT A (KITS) IMPLANT
MANIFOLD NEPTUNE II (INSTRUMENTS) ×3 IMPLANT
NEEDLE TAPERED W/ NITINOL LOOP (MISCELLANEOUS) IMPLANT
NS IRRIG 1000ML POUR BTL (IV SOLUTION) ×3 IMPLANT
PACK SHOULDER (CUSTOM PROCEDURE TRAY) ×3 IMPLANT
PAD COLD SHLDR WRAP-ON (PAD) IMPLANT
PEEK SWIVELOCK SHOU 3.9 (Orthopedic Implant) ×3 IMPLANT
PIN NITINOL TARGETER 2.8 (PIN) IMPLANT
PIN SET MODULAR GLENOID SYSTEM (PIN) ×3 IMPLANT
PROTECTOR NERVE ULNAR (MISCELLANEOUS) ×3 IMPLANT
RESTRAINT HEAD UNIVERSAL NS (MISCELLANEOUS) ×3 IMPLANT
SCREW SPINAL 40 F/CAGE LRG (Screw) ×3 IMPLANT
SIZER ECLIPSE CAGE SCREW (ORTHOPEDIC DISPOSABLE SUPPLIES) ×3 IMPLANT
SLING ARM FOAM STRAP LRG (SOFTGOODS) ×3 IMPLANT
SLING ARM FOAM STRAP MED (SOFTGOODS) IMPLANT
SMARTMIX MINI TOWER (MISCELLANEOUS)
SPONGE LAP 18X18 RF (DISPOSABLE) IMPLANT
SUCTION FRAZIER HANDLE 12FR (TUBING) ×3
SUCTION TUBE FRAZIER 12FR DISP (TUBING) ×1 IMPLANT
SUT FIBERWIRE #2 38 T-5 BLUE (SUTURE)
SUT MNCRL AB 3-0 PS2 18 (SUTURE) ×3 IMPLANT
SUT MON AB 2-0 CT1 36 (SUTURE) ×3 IMPLANT
SUT VIC AB 1 CT1 36 (SUTURE) ×3 IMPLANT
SUTURE FIBERWR #2 38 T-5 BLUE (SUTURE) IMPLANT
SUTURE TAPE 1.3 40 TPR END (SUTURE) ×1 IMPLANT
SUTURETAPE 1.3 40 TPR END (SUTURE) ×3
TOWEL OR 17X26 10 PK STRL BLUE (TOWEL DISPOSABLE) ×3 IMPLANT
TOWEL OR NON WOVEN STRL DISP B (DISPOSABLE) ×3 IMPLANT
TOWER SMARTMIX MINI (MISCELLANEOUS) IMPLANT
WATER STERILE IRR 1000ML POUR (IV SOLUTION) ×6 IMPLANT

## 2020-10-04 NOTE — H&P (Signed)
Peter Drake    Chief Complaint: Left shoulder osteoarthritis HPI: The patient is a 74 y.o. male with chronic and progressively increasing left shoulder pain and associated functional rotations due to severe osteoarthritis.  Due to his progressive functional deterioration and failure to respond to prolonged attempts at conservative management he is brought to the operating room at this time for planned left anatomic shoulder arthroplasty  Past Medical History:  Diagnosis Date  . Allergy    pcn  . Amaurosis fugax   . Anxiety   . Arthritis    HX: of OA  . BPH (benign prostatic hypertrophy)    Hx: of  . Cancer ([place]) 06/23/08   L vocoal cord sq cell ca  . Colorectal polyps 1/03  . Dyslipidemia   . Early cataracts, bilateral    Hx: of  . GERD (gastroesophageal reflux disease)   . Headache    has aura's associated with migraines  . History of radiation therapy 08/04/08-09/20/08   larynx  . Hyperlipidemia   . PVC (premature ventricular contraction)   . Radiation 08/04/08-09/20/08   Larynx   33 treatments  . Sleep apnea    borderline no cpap needed    Past Surgical History:  Procedure Laterality Date  . basal cell excisions     by dr. hall  . CERVICAL SPINE SURGERY    . COLONOSCOPY W/ BIOPSIES AND POLYPECTOMY     Hx: of  . COLONOSCOPY WITH PROPOFOL N/A 12/23/2018   Procedure: COLONOSCOPY WITH PROPOFOL;  Surgeon: Juanita Craver, MD;  Location: [facility];  Service: Endoscopy;  Laterality: N/A;  . ESOPHAGOGASTRODUODENOSCOPY (EGD) WITH PROPOFOL N/A 12/23/2018   Procedure: ESOPHAGOGASTRODUODENOSCOPY (EGD) WITH PROPOFOL;  Surgeon: Juanita Craver, MD;  Location: [facility];  Service: Endoscopy;  Laterality: N/A;  . EYE SURGERY     bil with implant  . FINGER SURGERY     right fifth  . INGUINAL HERNIA REPAIR   03/2005   left   . left knee arthoscopy     x 2  . MOHS SURGERY     x3  . NASAL SINUS SURGERY  1990's  . polyp removal     squamous cell  33 radiation treatments vocal cord   . POLYPECTOMY  12/23/2018   Procedure: POLYPECTOMY;  Surgeon: Juanita Craver, MD;  Location: [facility];  Service: Endoscopy;;  . RADIAL KERATOTOMY     20 years ago/ bi lateral  . TONSILLECTOMY     as a child  . TOTAL SHOULDER ARTHROPLASTY Right 11/03/2013   DR Alauna Hayden  . TOTAL SHOULDER ARTHROPLASTY Right 11/02/2013   Procedure: RIGHT TOTAL SHOULDER ARTHROPLASTY;  Surgeon: Marin Shutter, MD;  Location: [facility];  Service: Orthopedics;  Laterality: Right;    Family History  Problem Relation Age of Onset  . Heart disease Mother   . COPD Father   . Rectal cancer Sister   . Cancer Sister        rectal  . Melanoma Daughter 14       treated    Social History:  reports that he has quit smoking. His smoking use included cigarettes. He has a 15.00 pack-year smoking history. He has quit using smokeless tobacco. He reports current alcohol use of about 1.0 standard drink of alcohol per week. He reports that he does not use drugs.   Medications Prior to Admission  Medication Sig Dispense Refill  . acetaminophen (TYLENOL) 500 MG tablet Take 1,000 mg by mouth every 8 (eight) hours as needed for  moderate pain.    . pantoprazole (PROTONIX) 40 MG tablet Take 40 mg by mouth daily.    . simvastatin (ZOCOR) 20 MG tablet Take 20 mg by mouth daily.    . Tamsulosin HCl (FLOMAX) 0.4 MG CAPS Take 0.4 mg by mouth 2 (two) times daily.     Marland Kitchen tiZANidine (ZANAFLEX) 4 MG tablet Take 4 mg by mouth at bedtime as needed for muscle spasms.     . traMADol (ULTRAM) 50 MG tablet Take 50 mg by mouth daily as needed for moderate pain.        Physical Exam: Left shoulder demonstrates painful and guarded motion as noted at his recent office visits.  He maintains excellent strength to manual muscle testing.  Plain radiographs confirm severe arthritis with complete loss of joint space, peripheral osteophyte formation, and subchondral sclerosis.  Vitals  Weight:  [71.7 kg] 71.7 kg (12/16  0549)  Assessment/Plan  Impression: Left shoulder osteoarthritis  Plan of Action: Procedure(s): TOTAL SHOULDER ARTHROPLASTY  Dimetri Armitage M Jaquavius Hudler 10/04/2020, 6:43 AM Contact # [phone]

## 2020-10-04 NOTE — Transfer of Care (Signed)
Immediate Anesthesia Transfer of Care Note  Patient: Peter Drake  Procedure(s) Performed: TOTAL SHOULDER ARTHROPLASTY (Left Shoulder)  Patient Location: PACU  Anesthesia Type:General  Level of Consciousness: awake, alert  and oriented  Airway & Oxygen Therapy: Patient Spontanous Breathing and Patient connected to face mask  Post-op Assessment: Report given to RN and Post -op Vital signs reviewed and stable  Post vital signs: Reviewed and stable  Last Vitals:  Vitals Value Taken Time  BP 109/81 10/04/20 1000  Temp 36.4 C 10/04/20 0953  Pulse 79 10/04/20 1002  Resp 17 10/04/20 1002  SpO2 99 % 10/04/20 1002  Vitals shown include unvalidated device data.  Last Pain:  Vitals:   10/04/20 0953  TempSrc:   PainSc: 0-No pain      Patients Stated Pain Goal: 3 (78/47/84 1282)  Complications: No complications documented.

## 2020-10-04 NOTE — Anesthesia Postprocedure Evaluation (Signed)
Anesthesia Post Note  Patient: Peter Drake  Procedure(s) Performed: TOTAL SHOULDER ARTHROPLASTY (Left Shoulder)     Patient location during evaluation: PACU Anesthesia Type: Regional and General Level of consciousness: awake and alert Pain management: pain level controlled Vital Signs Assessment: post-procedure vital signs reviewed and stable Respiratory status: spontaneous breathing, nonlabored ventilation, respiratory function stable and patient connected to nasal cannula oxygen Cardiovascular status: blood pressure returned to baseline and stable Postop Assessment: no apparent nausea or vomiting Anesthetic complications: no   No complications documented.  Last Vitals:  Vitals:   10/04/20 1015 10/04/20 1020  BP: 116/79 (!) 160/70  Pulse: 73   Resp: 15 16  Temp: (!) 36.3 C 36.4 C  SpO2: 99% 98%    Last Pain:  Vitals:   10/04/20 1020  TempSrc:   PainSc: 0-No pain   Pain Goal: Patients Stated Pain Goal: 3 (10/04/20 0549)                 Belenda Cruise P Elridge Stemm

## 2020-10-04 NOTE — Discharge Instructions (Signed)
 Kevin M. Supple, M.D., F.A.A.O.S. Orthopaedic Surgery Specializing in Arthroscopic and Reconstructive Surgery of the Shoulder [phone] [address] - [place], [place] [address] - Fax [phone]   POST-OP TOTAL SHOULDER REPLACEMENT INSTRUCTIONS  1. Follow up in the office for your first post-op appointment 10-14 days from the date of your surgery. If you do not already have a scheduled appointment, our office will contact you to schedule.  2. The bandage over your incision is waterproof. You may begin showering with this dressing on. You may leave this dressing on until first follow up appointment within 2 weeks. We prefer you leave this dressing in place until follow up however after 5-7 days if you are having itching or skin irritation and would like to remove it you may do so. Go slow and tug at the borders gently to break the bond the dressing has with the skin. At this point if there is no drainage it is okay to go without a bandage or you may cover it with a light guaze and tape. You can also expect significant bruising around your shoulder that will drift down your arm and into your chest wall. This is very normal and should resolve over several days.   3. Wear your sling/immobilizer at all times except to perform the exercises below or to occasionally let your arm dangle by your side to stretch your elbow. You also need to sleep in your sling immobilizer until instructed otherwise. It is ok to remove your sling if you are sitting in a controlled environment and allow your arm to rest in a position of comfort by your side or on your lap with pillows to give your neck and skin a break from the sling. You may remove it to allow arm to dangle by side to shower. If you are up walking around and when you go to sleep at night you need to wear it.  4. Range of motion to your elbow, wrist, and hand are encouraged 3-5 times daily. Exercise to your hand and fingers helps to reduce  swelling you may experience.   5. Prescriptions for a pain medication and a muscle relaxant are provided for you. It is recommended that if you are experiencing pain that you pain medication alone is not controlling, add the muscle relaxant along with the pain medication which can give additional pain relief. The first 1-2 days is generally the most severe of your pain and then should gradually decrease. As your pain lessens it is recommended that you decrease your use of the pain medications to an "as needed basis'" only and to always comply with the recommended dosages of the pain medications.  6. Pain medications can produce constipation along with their use. If you experience this, the use of an over the counter stool softener or laxative daily is recommended.   7. For additional questions or concerns, please do not hesitate to call the office. If after hours there is an answering service to forward your concerns to the physician on call.  8.Pain control following an exparel block  To help control your post-operative pain you received a nerve block  performed with Exparel which is a long acting anesthetic (numbing agent) which can provide pain relief and sensations of numbness (and relief of pain) in the operative shoulder and arm for up to 3 days. Sometimes it provides mixed relief, meaning you may still have numbness in certain areas of the arm but can still be able to   move  parts of that arm, hand, and fingers. We recommend that your prescribed pain medications  be used as needed. We do not feel it is necessary to "pre medicate" and "stay ahead" of pain.  Taking narcotic pain medications when you are not having any pain can lead to unnecessary and potentially dangerous side effects.    9. Use the ice machine as much as possible in the first 5-7 days from surgery, then you can wean its use to as needed. The ice typically needs to be replaced every 6 hours, instead of ice you can actually freeze  water bottles to put in the cooler and then fill water around them to avoid having to purchase ice. You can have spare water bottles freezing to allow you to rotate them once they have melted. Try to have a thin shirt or light cloth or towel under the ice wrap to protect your skin.   FOR ADDITIONAL INFO ON ICE MACHINE AND INSTRUCTIONS GO TO THE WEBSITE AT  https://www.[employer].com/products/donjoy/donjoy-iceman-[email or web address]  10.  We recommend that you avoid any dental work or cleaning in the first 3 months following your joint replacement. This is to help minimize the possibility of infection from the bacteria in your mouth that enters your bloodstream during dental work. We also recommend that you take an antibiotic prior to your dental work for the first year after your shoulder replacement to further help reduce that risk. Please simply contact our office for antibiotics to be sent to your pharmacy prior to dental work.  11. Dental Antibiotics:  In most cases prophylactic antibiotics for Dental procdeures after total joint surgery are not necessary.  Exceptions are as follows:  1. History of prior total joint infection  2. Severely immunocompromised (Organ Transplant, cancer chemotherapy, Rheumatoid biologic meds such as Humera)  3. Poorly controlled diabetes (A1C &gt; 8.0, blood glucose over 200)  If you have one of these conditions, contact your surgeon for an antibiotic prescription, prior to your dental procedure.   POST-OP EXERCISES  Pendulum Exercises  Perform pendulum exercises while standing and bending at the waist. Support your uninvolved arm on a table or chair and allow your operated arm to hang freely. Make sure to do these exercises passively - not using you shoulder muscles. These exercises can be performed once your nerve block effects have worn off.  Repeat 20 times. Do 3 sessions per day.     

## 2020-10-04 NOTE — Op Note (Signed)
10/04/2020  9:38 AM  PATIENT:   Peter Drake  74 y.o. male  PRE-OPERATIVE DIAGNOSIS:  Left shoulder osteoarthritis  POST-OPERATIVE DIAGNOSIS: Same  PROCEDURE: Left shoulder stemless anatomic arthroplasty utilizing a size 47 trunnion with a large lag screw and a 47 x 18 humeral head, and a large glenoid  SURGEON:  Jameeka Marcy, Metta Clines M.D.  ASSISTANTS: Jenetta Loges, PA-C  ANESTHESIA:   General endotracheal and interscalene block with Exparel  EBL: 200 cc  SPECIMEN: None  Drains: None   PATIENT DISPOSITION:  PACU - hemodynamically stable.    PLAN OF CARE: Discharge to home after PACU  Brief history:  Patient is a 74 year old male well-known to our practice status post a previous right shoulder anatomic arthroplasty and has done very well with the right shoulder.  He now presents with chronic and progressively increasing left shoulder pain related to severe osteoarthritis.  His plain radiographs confirm complete loss of joint space, subchondral sclerosis, and peripheral osteophyte formation.  Due to his increasing pain and functional imitations and failure to respond to prolonged attempts at conservative management he is brought to the operating room this time for planned left shoulder anatomic arthroplasty.  Preoperatively, I counseled the patient regarding treatment options and risks versus benefits thereof.  Possible surgical complications were all reviewed including potential for bleeding, infection, neurovascular injury, persistent pain, loss of motion, anesthetic complication, failure of the implant, and possible need for additional surgery. They understand and accept and agrees with our planned procedure.   Procedure in detail:  After undergoing routine preop evaluation the patient received prophylactic antibiotics and interscalene block with Exparel was established in the holding area by the anesthesia department.  Patient subsequently placed supine on the operating  table and underwent smooth induction of a general endotracheal anesthesia.  Placed into the beachchair position and appropriately padded and protected.  Left shoulder girdle region was sterilely prepped and draped in standard fashion.  Timeout was called.  An anterior deltopectoral approach left shoulder is made through a 10 cm incision.  Skin flaps elevated dissection carried deeply and the deltopectoral interval was then opened from proximal to distal with the vein taken laterally.  Upper centimeter the pectoralis major was tenotomized for exposure the conjoined tendon was mobilized and retracted medially.  Long head biceps tendon was then unroofed and tenodesed to the upper border the pectoralis major tendon with the proximal segment excised.  Rotators cuff split along the rotator interval to the base of the coracoid and the insertion of the subscap into the lesser tuberosity was identified and an oscillating saw was then used to perform a thin wafer lesser tuberosity osteotomy on the subscapularis was then tagged mobilized and retracted medially.  Capsular attachments were then divided from the anterior and infra margins of the humeral neck allowing deliver the humeral head through the wound.  Extra medullary guide was then used to outline the proposed humeral head resection which was then performed at the native retroversion approximately 20 degrees.  Oscillating saw was then used to perform the humeral head resection with care taken to protect the rotator cuff.  Large marginal osteophytes were removed with rondure and the humeral metaphysis was sized at a 47.  We prepared the metaphysis for a trunnion and a metal cap was then placed over the cut proximal humeral surface.  This point we proceeded with exposure of the glenoid using appropriate retractors and performed a circumferential labral resection gaining complete visualization the periphery of the glenoid.  This was sized at a large and a guidepin was then  directed into the center of the glenoid and glenoid was then reamed to a subchondral bony bed.  Terminal preparation was completed with the central drill and superior and inferior peg and slot respectively and then broached and the trial showed excellent fit.  At this point the glenoid was cleaned and dried.  Cement was then mixed into the superior and inferior peg and slot respectively and our central peg of the glenoid implant was packed with morselized bone graft and the glenoid was then impacted with excellent fit and fixation.  We returned our attention back to the proximal humerus where the trunnion was impacted with a suture tape threaded through the anterior eyelet of the trunnion.  Our cage screw size large was then filled with bone graft and then inserted achieving excellent fit and fixation.  We then performed a series of trial reductions and felt that the 47 x 18 head gave [place] the best soft tissue balance with approximately 50% translation across the glenoid and excellent soft tissue tension mobility excursion and balance.  At this point we placed 2 additional medial row suture anchors into the humeral metaphysis for eventual repair of the subscapularis LTO.  The final humeral head was then impacted onto the trunnion after it had been cleaned and dried.  Final reduction was then completed showing excellent motion good stability good soft tissue balance.  At this time we confirmed good mobility of the subscapularis.  Our medial row sutures were passed through the bone tendon junction equidistant across the width of the subscapularis and we placed a stay suture at the apex of the subscapularis at its insertion into the posterior rotator cuff and then our medial row suture limbs were then threaded in an alternating fashion into 2 Lateral Row anchors which were then seated into the bicipital groove and overall achieved excellent purchase and fixation along with excellent reapposition of the thin wafer of  lesser tuberosity with the subscapularis tendon against the donor site on the humerus.  Excellent stability was achieved.  We then repaired the balance of the rotator interval with figure-of-eight suture tape sutures.  Upon completion the arm easily achieved 45 degrees of external rotation without excessive tension of the subscap repair.  The wounds and copes irrigated.  Final hemostasis was obtained.  The deltopectoral interval was reapproximated with a series of figure-of-eight and 1 Vicryl sutures.  2-0 Vicryl used in the subcu layer and intracuticular 3 Monocryl for the skin followed by Dermabond and Aquasol dressing.  Left arm was then placed in a sling and patient was awakened, extubated, and taken to the recovery room in stable condition.  Jenetta Loges, PA-C was utilized as an Environmental consultant throughout this case, essential for help with positioning the patient, positioning extremity, tissue manipulation, implantation of the prosthesis, suture management, wound closure, and intraoperative decision-making.  Marin Shutter MD   Contact # [phone]

## 2020-10-04 NOTE — Anesthesia Procedure Notes (Signed)
Anesthesia Regional Block: Interscalene brachial plexus block   Pre-Anesthetic Checklist: ,, timeout performed, Correct Patient, Correct Site, Correct Laterality, Correct Procedure, Correct Position, site marked, Risks and benefits discussed,  Surgical consent,  Pre-op evaluation,  At surgeon's request and post-op pain management  Laterality: Left  Prep: Dura Prep       Needles:  Injection technique: Single-shot  Needle Type: Echogenic Stimulator Needle     Needle Length: 5cm  Needle Gauge: 20     Additional Needles:   Procedures:,,,, ultrasound used (permanent image in chart),,,,  Narrative:  Start time: 10/04/2020 7:00 AM End time: 10/04/2020 7:04 AM Injection made incrementally with aspirations every 5 mL.  Performed by: Personally  Anesthesiologist: Darral Dash, DO  Additional Notes: Patient identified. Risks/Benefits/Options discussed with patient including but not limited to bleeding, infection, nerve damage, failed block, incomplete pain control. Patient expressed understanding and wished to proceed. All questions were answered. Sterile technique was used throughout the entire procedure. Please see nursing notes for vital signs. Aspirated in 5cc intervals with injection for negative confirmation. Patient was given instructions on fall risk and not to get out of bed. All questions and concerns addressed with instructions to call with any issues or inadequate analgesia.

## 2020-10-04 NOTE — Anesthesia Procedure Notes (Signed)
Procedure Name: Intubation Performed by: Rosaland Lao, CRNA Pre-anesthesia Checklist: Patient identified, Emergency Drugs available, Suction available and Patient being monitored Patient Re-evaluated:Patient Re-evaluated prior to induction Oxygen Delivery Method: Circle system utilized Preoxygenation: Pre-oxygenation with 100% oxygen Induction Type: IV induction Ventilation: Mask ventilation without difficulty Laryngoscope Size: Mac and 4 Grade View: Grade I Tube type: Oral Tube size: 7.0 mm Number of attempts: 1 Airway Equipment and Method: Stylet Placement Confirmation: ETT inserted through vocal cords under direct vision,  positive ETCO2 and breath sounds checked- equal and bilateral Tube secured with: Tape Dental Injury: Teeth and Oropharynx as per pre-operative assessment

## 2020-10-04 NOTE — Evaluation (Signed)
Occupational Therapy Evaluation Patient Details Name: Peter Drake MRN: [id number] DOB: [birth year] Today's Date: 10/04/2020    History of Present Illness s/p L TSA   Clinical Impression   Peter Drake is a 74 year old man s/p shoulder replacement without functional use of left non-dominant upper extremity secondary to effects of surgery, interscalene block and shoulder precautions. Patient familiar with precautions and education from prior surgery. Patient has minimal active movement at this time secondary to effects of block. Therapist provided education and instruction to patient and spouse in regards to exercises, precautions, positioning, donning upper extremity clothing and bathing while maintaining shoulder precautions, ice and edema management and donning/doffing sling. Patient and spouse verbalized understanding and demonstrated as needed. Patient needed assistance to donn shirt, underwear, pants, socks and shoes and provided with instruction on compensatory strategies to perform ADLs. Patient to follow up with MD for further therapy needs.      Follow Up Recommendations  No OT follow up;Follow surgeon's recommendation for DC plan and follow-up therapies    Equipment Recommendations  None recommended by OT    Recommendations for Other Services       Precautions / Restrictions Precautions Precautions: Shoulder Shoulder Interventions: Shoulder sling/immobilizer;At all times;Off for dressing/bathing/exercises Precaution Comments: May come out of sling if sitting in controlled environment. ie while watching tv, eating etc to give neck and skin break from sling. Please sleep in sling though until 4 weeks post op.     Ok to use operative arm to assist in feeding, bathing, ADL's. New PROM restrictions (8/18) for use in hygiene and ADL only   ER 20   ABD 45   FE 60 Pendulums are to be gentle and are the preferred exercise to be instructed for patients to perform at home.( Along  with elbow wrist and hand exercise) Restrictions Weight Bearing Restrictions: No      Mobility Bed Mobility Overal bed mobility: Modified Independent                  Transfers                      Balance                                           ADL either performed or assessed with clinical judgement   ADL Overall ADL's : Needs assistance/impaired Eating/Feeding: Set up   Grooming: Modified independent   Upper Body Bathing: Modified independent   Lower Body Bathing: Modified independent   Upper Body Dressing : Moderate assistance   Lower Body Dressing: Minimal assistance   Toilet Transfer: Modified Independent   Toileting- Clothing Manipulation and Hygiene: +2 for safety/equipment               Vision Baseline Vision/History: No visual deficits       Perception     Praxis      Pertinent Vitals/Pain Pain Assessment: No/denies pain     Hand Dominance     Extremity/Trunk Assessment Upper Extremity Assessment Upper Extremity Assessment: LUE deficits/detail LUE Deficits / Details: Minimal active movement secondary to interscalene block   Lower Extremity Assessment Lower Extremity Assessment: Overall WFL for tasks assessed   Cervical / Trunk Assessment Cervical / Trunk Assessment: Normal   Communication     Cognition Arousal/Alertness: Awake/alert Behavior During Therapy: WFL for tasks assessed/performed Overall  Cognitive Status: Within Functional Limits for tasks assessed                                     General Comments       Exercises     Shoulder Instructions Shoulder Instructions Donning/doffing shirt without moving shoulder: Independent Method for sponge bathing under operated UE: Independent Donning/doffing sling/immobilizer: Independent Correct positioning of sling/immobilizer: Independent Pendulum exercises (written home exercise program): Independent ROM for elbow, wrist and  digits of operated UE: Independent Sling wearing schedule (on at all times/off for ADL's): Independent Proper positioning of operated UE when showering: Independent Dressing change: Independent Positioning of UE while sleeping: [place] expects to be discharged to:: Private residence Living Arrangements: Spouse/significant other Available Help at Discharge: Family;Available 24 hours/day                                    Prior Functioning/Environment                   OT Problem List: Decreased strength;Decreased range of motion;Impaired UE functional use      OT Treatment/Interventions:      OT Goals(Current goals can be found in the care plan section) Acute Rehab OT Goals OT Goal Formulation: All assessment and education complete, DC therapy  OT Frequency:     Barriers to D/C:            Co-evaluation              AM-PAC OT "6 Clicks" Daily Activity     Outcome Measure Help from another person eating meals?: A Little Help from another person taking care of personal grooming?: None Help from another person toileting, which includes using toliet, bedpan, or urinal?: A Little Help from another person bathing (including washing, rinsing, drying)?: A Little Help from another person to put on and taking off regular upper body clothing?: A Lot Help from another person to put on and taking off regular lower body clothing?: A Little 6 Click Score: 18   End of Session Nurse Communication:  (OT education complete)  Activity Tolerance: Patient tolerated treatment well Patient left: in chair  OT Visit Diagnosis: Muscle weakness (generalized) (M62.81)                Time: 6962-9528 OT Time Calculation (min): 29 min Charges:  OT General Charges $OT Visit: 1 Visit OT Evaluation $OT Eval Low Complexity: 1 Low OT Treatments $Self Care/Home Management : 8-22 mins  Amori Colomb, OTR/L Acute Care Rehab Services  Office  [phone] Pager: [phone]   Lenward Chancellor 10/04/2020, 12:05 PM

## 2020-10-09 ENCOUNTER — Encounter (HOSPITAL_COMMUNITY): Payer: Self-pay | Admitting: Orthopedic Surgery

## 2022-02-11 ENCOUNTER — Other Ambulatory Visit: Payer: Self-pay | Admitting: Gastroenterology

## 2022-02-11 ENCOUNTER — Other Ambulatory Visit (HOSPITAL_COMMUNITY): Payer: Self-pay | Admitting: Gastroenterology

## 2022-02-11 DIAGNOSIS — R1032 Left lower quadrant pain: Secondary | ICD-10-CM

## 2022-02-14 ENCOUNTER — Ambulatory Visit (HOSPITAL_COMMUNITY)
Admission: RE | Admit: 2022-02-14 | Discharge: 2022-02-14 | Disposition: A | Discharge status: Home / Self Care | Payer: Medicare PPO | Source: Ambulatory Visit | Attending: Gastroenterology | Admitting: Gastroenterology

## 2022-02-14 DIAGNOSIS — R1032 Left lower quadrant pain: Secondary | ICD-10-CM | POA: Insufficient documentation

## 2022-02-14 MED ORDER — SODIUM CHLORIDE (PF) 0.9 % IJ SOLN
INTRAMUSCULAR | Status: AC
  Filled 2022-02-14: qty 50

## 2022-02-14 MED ORDER — IOHEXOL 300 MG/ML  SOLN
100.0000 mL | Freq: Once | INTRAMUSCULAR | Status: AC | PRN
  Administered 2022-02-14: 100 mL via INTRAVENOUS

## 2022-02-24 ENCOUNTER — Other Ambulatory Visit (HOSPITAL_COMMUNITY): Payer: Medicare PPO

## 2022-04-21 NOTE — Patient Instructions (Signed)
DUE TO SPACE LIMITATIONS, ONLY TWO VISITORS  (aged 77 and older) ARE ALLOWED TO COME WITH YOU AND STAY IN THE WAITING ROOM DURING YOUR PRE OP AND PROCEDURE.   **NO VISITORS ARE ALLOWED IN THE SHORT STAY AREA OR RECOVERY ROOM!!**  IF YOU WILL BE ADMITTED INTO THE HOSPITAL YOU ARE ALLOWED ONLY FOUR SUPPORT PEOPLE DURING VISITATION HOURS (7 AM -8PM)   The support person(s) must pass our screening, and use Hand sanitizing gel. Visitors GUEST BADGE MUST BE WORN VISIBLY  One adult visitor may remain with you overnight and MUST be in the room by 8 P.M.   You are not required to quarantine at this time prior to your surgery. However, you must do this: Hand Hygiene often Do NOT share personal items Notify your provider if you are in close contact with someone who has COVID or you develop fever 100.4 or greater, new onset of sneezing, cough, sore throat, shortness of breath or body aches.       Your procedure is scheduled on:  Tuesday May 06, 2022  Report to [facility] Main Entrance.  Report to admitting at: 11:45   AM  +++++Call this number if you have any questions or problems the morning of surgery [phone]  Do not eat food After Midnight the night prior to your surgery/procedure.  After Midnight you may have the following liquids until   11:15   AM DAY OF SURGERY  Clear Liquid Diet Water Black Coffee (sugar ok, NO MILK/CREAM OR CREAMERS)  Tea (sugar ok, NO MILK/CREAM OR CREAMERS) regular and decaf                             Plain Jell-O (NO RED)                                           Fruit ices (not with fruit pulp, NO RED)                                     Popsicles (NO RED)                                                                  Juice: apple, WHITE grape, WHITE cranberry Sports drinks like Gatorade (NO RED) Clear broth(vegetable,chicken,beef)                   The day of surgery:  Drink ONE (1) Pre-Surgery Clear Ensure  at   11:15 am the morning of  surgery. Drink in one sitting. Do not sip.  This drink was given to you during your hospital  pre-op appointment visit. Nothing else to drink after completing the Pre-Surgery Clear Ensure .   If you have questions, please contact your surgeon's office.   FOLLOW ANY ADDITIONAL PRE OP INSTRUCTIONS YOU RECEIVED FROM YOUR SURGEON'S OFFICE!!!   Oral Hygiene is also important to reduce your risk of infection.        Remember - BRUSH YOUR TEETH THE MORNING OF SURGERY WITH YOUR  REGULAR TOOTHPASTE  Take ONLY these medicines the morning of surgery with A SIP OF WATER: Tamsulosin (Flomas), Pantoprazole (Protonix), and you may takeTylenol if needed.  ? CPAP  Mild OSA,     Bring CPAP mask and tubing day of surgery.                   You may not have any metal on your body including jewelry, and body piercing  Do not wear lotions, powders, cologne, or deodorant  Men may shave face and neck.  Contacts, Hearing Aids, dentures or bridgework may not be worn into surgery.   You may bring a small overnight bag with you on the day of surgery, only pack items that are not valuable .[facility] IS NOT RESPONSIBLE   FOR VALUABLES THAT ARE LOST OR STOLEN.   DO NOT [place]. PHARMACY WILL DISPENSE MEDICATIONS LISTED ON YOUR MEDICATION LIST TO YOU DURING YOUR ADMISSION [place]!   Special Instructions: Bring a copy of your healthcare power of attorney and living will documents the day of surgery, if you wish to have them scanned into your [facility] Records- EPIC  Please read over the following fact sheets you were given: IF YOU HAVE QUESTIONS ABOUT YOUR PRE-OP INSTRUCTIONS, PLEASE CALL [phone]  ([place])   [place] - Preparing for Surgery Before surgery, you can play an important role.  Because skin is not sterile, your skin needs to be as free of germs as possible.  You can reduce the number of germs on your skin by washing with CHG (chlorahexidine  gluconate) soap before surgery.  CHG is an antiseptic cleaner which kills germs and bonds with the skin to continue killing germs even after washing. Please DO NOT use if you have an allergy to CHG or antibacterial soaps.  If your skin becomes reddened/irritated stop using the CHG and inform your nurse when you arrive at Short Stay. Do not shave (including legs and underarms) for at least 48 hours prior to the first CHG shower.  You may shave your face/neck.  Please follow these instructions carefully:  1.  Shower with CHG Soap the night before surgery and the  morning of surgery.  2.  If you choose to wash your hair, wash your hair first as usual with your normal  shampoo.  3.  After you shampoo, rinse your hair and body thoroughly to remove the shampoo.                             4.  Use CHG as you would any other liquid soap.  You can apply chg directly to the skin and wash.  Gently with a scrungie or clean washcloth.  5.  Apply the CHG Soap to your body ONLY FROM THE NECK DOWN.   Do not use on face/ open                           Wound or open sores. Avoid contact with eyes, ears mouth and genitals (private parts).                       Wash face,  Genitals (private parts) with your normal soap.             6.  Wash thoroughly, paying special attention to the area where your  surgery  will be performed.  7.  Thoroughly rinse your body with warm water from the neck down.  8.  DO NOT shower/wash with your normal soap after using and rinsing off the CHG Soap.            9.  Pat yourself dry with a clean towel.            10.  Wear clean pajamas.            11.  Place clean sheets on your bed the night of your first shower and do not  sleep with pets.  ON THE DAY OF SURGERY : Do not apply any lotions/deodorants the morning of surgery.  Please wear clean clothes to the hospital/surgery center.    FAILURE TO FOLLOW THESE INSTRUCTIONS MAY RESULT IN THE CANCELLATION OF YOUR SURGERY  PATIENT  SIGNATURE_________________________________  NURSE SIGNATURE__________________________________  ________________________________________________________________________    Peter Drake    An incentive spirometer is a tool that can help keep your lungs clear and active. This tool measures how well you are filling your lungs with each breath. Taking long deep breaths may help reverse or decrease the chance of developing breathing (pulmonary) problems (especially infection) following: A long period of time when you are unable to move or be active. BEFORE THE PROCEDURE  If the spirometer includes an indicator to show your best effort, your nurse or respiratory therapist will set it to a desired goal. If possible, sit up straight or lean slightly forward. Try not to slouch. Hold the incentive spirometer in an upright position. INSTRUCTIONS FOR USE  Sit on the edge of your bed if possible, or sit up as far as you can in bed or on a chair. Hold the incentive spirometer in an upright position. Breathe out normally. Place the mouthpiece in your mouth and seal your lips tightly around it. Breathe in slowly and as deeply as possible, raising the piston or the ball toward the top of the column. Hold your breath for 3-5 seconds or for as long as possible. Allow the piston or ball to fall to the bottom of the column. Remove the mouthpiece from your mouth and breathe out normally. Rest for a few seconds and repeat Steps 1 through 7 at least 10 times every 1-2 hours when you are awake. Take your time and take a few normal breaths between deep breaths. The spirometer may include an indicator to show your best effort. Use the indicator as a goal to work toward during each repetition. After each set of 10 deep breaths, practice coughing to be sure your lungs are clear. If you have an incision (the cut made at the time of surgery), support your incision when coughing by placing a pillow or rolled up  towels firmly against it. Once you are able to get out of bed, walk around indoors and cough well. You may stop using the incentive spirometer when instructed by your caregiver.  RISKS AND COMPLICATIONS Take your time so you do not get dizzy or light-headed. If you are in pain, you may need to take or ask for pain medication before doing incentive spirometry. It is harder to take a deep breath if you are having pain. AFTER USE Rest and breathe slowly and easily. It can be helpful to keep track of a log of your progress. Your caregiver can provide you with a simple table to help with this. If you are using the spirometer at home, follow these instructions: [facility]  CARE IF:  You are having difficultly using the spirometer. You have trouble using the spirometer as often as instructed. Your pain medication is not giving enough relief while using the spirometer. You develop fever of 100.5 F (38.1 C) or higher.                                                                                                    SEEK IMMEDIATE MEDICAL CARE IF:  You cough up bloody sputum that had not been present before. You develop fever of 102 F (38.9 C) or greater. You develop worsening pain at or near the incision site. MAKE SURE YOU:  Understand these instructions. Will watch your condition. Will get help right away if you are not doing well or get worse. Document Released: 02/16/2007 Document Revised: 12/29/2011 Document Reviewed: 04/19/2007 [facility] Patient Information 2014 [place], [place].

## 2022-04-21 NOTE — Progress Notes (Addendum)
COVID Vaccine received:  '[]'$  No '[x]'$  Yes  Date of any COVID positive Test in last 90 days:  PCP - Gaynelle Arabian, MD    Medical clearance on chart 03-05-22   Cardiologist -   Chest x-ray -  EKG -   Stress Test -  ECHO -  Cardiac Cath -   Pacemaker/ICD device last checked: Date:       '[]'$  N/A Spinal Cord Stimulator:'[]'$  No '[]'$  Yes   Other Implants:   Bowel Prep -   History of Sleep Apnea? '[]'$  No '[x]'$  Yes  mild Sleep Study Date:  Borderline study no CPAP  CPAP used?- '[x]'$  No '[]'$  Yes  (Instruct to bring their mask & Tubing)  Does the patient monitor blood sugar? '[]'$  No '[]'$  Yes  '[]'$  N/A Does patient have a [employer] or Dexacom? '[]'$  No '[]'$  Yes   Fasting Blood Sugar Ranges-  Checks Blood Sugar _____ times a day  Blood Thinner Instructions: Aspirin Instructions: Last Dose:  Activity level:  Can go up a flight of stairs and perform activities of daily living without stopping and       without symptoms of chest pain or shortness of breath.'[]'$  No '[]'$  Yes  '[]'$  N/A  Able to do exercises without symptoms '[]'$  No '[]'$  Yes  '[]'$  N/A  Patient able to complete ADLs without assistance '[]'$  No '[]'$  Yes  '[]'$  N/A    Comments:   Anesthesia review: BPH, Hx Laryngeal Ca s/p radiation 2009, GERD,  Patient denies shortness of breath, fever, cough and chest pain at PAT appointment  Patient verbalized understanding and agreement to the Pre-Surgical Instructions that were given to them at this PAT appointment. Patient was also educated of the need to review these PAT instructions again prior to his/her surgery.I reviewed the appropriate phone numbers to call if they have any and questions or concerns.

## 2022-04-23 ENCOUNTER — Other Ambulatory Visit: Payer: Self-pay

## 2022-04-23 ENCOUNTER — Encounter (HOSPITAL_COMMUNITY): Payer: Self-pay

## 2022-04-23 ENCOUNTER — Encounter (HOSPITAL_COMMUNITY)
Admission: RE | Admit: 2022-04-23 | Discharge: 2022-04-23 | Disposition: A | Discharge status: Home / Self Care | Payer: Medicare PPO | Source: Ambulatory Visit | Attending: Orthopedic Surgery | Admitting: Orthopedic Surgery

## 2022-04-23 VITALS — BP 130/81 | HR 62 | Temp 98.2°F | Resp 12 | Ht 70.0 in | Wt 156.0 lb

## 2022-04-23 DIAGNOSIS — M1612 Unilateral primary osteoarthritis, left hip: Secondary | ICD-10-CM | POA: Insufficient documentation

## 2022-04-23 DIAGNOSIS — Z01818 Encounter for other preprocedural examination: Secondary | ICD-10-CM | POA: Diagnosis present

## 2022-04-23 HISTORY — DX: Personal history of other malignant neoplasm of skin: Z85.828

## 2022-04-23 HISTORY — DX: Other specified postprocedural states: Z98.890

## 2022-04-23 LAB — CBC
HCT: 45 % (ref 39.0–52.0)
Hemoglobin: 15.2 g/dL (ref 13.0–17.0)
MCH: 31.3 pg (ref 26.0–34.0)
MCHC: 33.8 g/dL (ref 30.0–36.0)
MCV: 92.8 fL (ref 80.0–100.0)
Platelets: 197 10*3/uL (ref 150–400)
RBC: 4.85 MIL/uL (ref 4.22–5.81)
RDW: 12.4 % (ref 11.5–15.5)
WBC: 5.3 10*3/uL (ref 4.0–10.5)
nRBC: 0 % (ref 0.0–0.2)

## 2022-04-23 LAB — SURGICAL PCR SCREEN
MRSA, PCR: NEGATIVE
Staphylococcus aureus: NEGATIVE

## 2022-04-24 LAB — TYPE AND SCREEN
ABO/RH(D): O NEG
Antibody Screen: NEGATIVE
Weak D: POSITIVE

## 2022-05-05 NOTE — H&P (Signed)
TOTAL HIP ADMISSION H&P  Patient is admitted for left total hip arthroplasty.  Subjective:  Chief Complaint: left hip pain  HPI: Peter Drake, 76 y.o. male, has a history of pain and functional disability in the left hip(s) due to arthritis and patient has failed non-surgical conservative treatments for greater than 12 weeks to include NSAID's and/or analgesics and activity modification.  Onset of symptoms was gradual starting 2 years ago with gradually worsening course since that time.The patient noted no past surgery on the left hip(s).  Patient currently rates pain in the left hip at 8 out of 10 with activity. Patient has worsening of pain with activity and weight bearing, pain that interfers with activities of daily living, and pain with passive range of motion. Patient has evidence of joint space narrowing by imaging studies. This condition presents safety issues increasing the risk of falls.  There is no current active infection.  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 08/04/2008 through 09/20/2008 06/23/2008   Past Medical History:  Diagnosis Date   Allergy    pcn   Amaurosis fugax    Anxiety    Arthritis    HX: of OA   BPH (benign prostatic hypertrophy)    Hx: of   Cancer ([place]) 06/23/2008   L vocoal cord sq cell ca   Colorectal polyps 10/2001   Dyslipidemia    Early cataracts, bilateral    Hx: of   GERD (gastroesophageal reflux disease)    H/O Mohs micrographic surgery for skin cancer    Headache    has aura's associated with migraines   History of radiation therapy 08/04/08-09/20/08   larynx   Hyperlipidemia    PVC (premature ventricular contraction)    Radiation 08/04/08-09/20/08   Larynx   33 treatments   Sleep apnea    borderline no  cpap needed    Past Surgical History:  Procedure Laterality Date   basal cell excisions     by dr. hall   CERVICAL SPINE SURGERY     COLONOSCOPY W/ BIOPSIES AND POLYPECTOMY     Hx: of   COLONOSCOPY WITH PROPOFOL N/A 12/23/2018   Procedure: COLONOSCOPY WITH PROPOFOL;  Surgeon: Juanita Craver, MD;  Location: [facility];  Service: Endoscopy;  Laterality: N/A;   ESOPHAGOGASTRODUODENOSCOPY (EGD) WITH PROPOFOL N/A 12/23/2018   Procedure: ESOPHAGOGASTRODUODENOSCOPY (EGD) WITH PROPOFOL;  Surgeon: Juanita Craver, MD;  Location: [facility];  Service: Endoscopy;  Laterality: N/A;   EYE SURGERY     bil with implant   FINGER SURGERY     right fifth   INGUINAL HERNIA REPAIR   03/2005   left    left knee arthoscopy     x 2   MOHS SURGERY     x3   NASAL SINUS SURGERY  1990's   polyp removal     squamous cell  33 radiation treatments vocal cord   POLYPECTOMY  12/23/2018   Procedure: POLYPECTOMY;  Surgeon: Juanita Craver, MD;  Location: [facility];  Service: Endoscopy;;   RADIAL KERATOTOMY     20 years ago/ bi lateral   TONSILLECTOMY     as a child   TOTAL SHOULDER ARTHROPLASTY Right 11/03/2013   DR SUPPLE   TOTAL SHOULDER ARTHROPLASTY Right 11/02/2013  Procedure: RIGHT TOTAL SHOULDER ARTHROPLASTY;  Surgeon: Marin Shutter, MD;  Location: [place];  Service: Orthopedics;  Laterality: Right;   TOTAL SHOULDER ARTHROPLASTY Left 10/04/2020   Procedure: TOTAL SHOULDER ARTHROPLASTY;  Surgeon: Justice Britain, MD;  Location: [facility];  Service: Orthopedics;  Laterality: Left;  130mn    No current facility-administered medications for this encounter.   Current Outpatient Medications  Medication Sig Dispense Refill Last Dose   acetaminophen (TYLENOL) 500 MG tablet Take 1,000 mg by mouth every 8 (eight) hours as needed for moderate pain.      fluticasone (FLONASE) 50 MCG/ACT nasal spray Place 2 sprays into both nostrils at bedtime.      pantoprazole (PROTONIX) 40 MG tablet Take 40 mg by mouth daily.       simvastatin (ZOCOR) 20 MG tablet Take 20 mg by mouth daily.      Tamsulosin HCl (FLOMAX) 0.4 MG CAPS Take 0.4 mg by mouth 2 (two) times daily.       tiZANidine (ZANAFLEX) 4 MG tablet Take 2 mg by mouth at bedtime.      traZODone (DESYREL) 50 MG tablet Take 50 mg by mouth at bedtime.      Allergies  Allergen Reactions   Penicillins Hives    Did it involve swelling of the face/tongue/throat, SOB, or low BP? Unknown Did it involve sudden or severe rash/hives, skin peeling, or any reaction on the inside of your mouth or nose? Unknown Did you need to seek medical attention at a hospital or doctor's office? Unknown When did it last happen?      Childhood allergy If all above answers are "NO", may proceed with cephalosporin use. Patient tolerated ancef administration without adverse reaction.    Social History   Tobacco Use   Smoking status: Former    Packs/day: 1.00    Years: 15.00    Total pack years: 15.00    Types: Cigarettes    Quit date: 1980    Years since quitting: 43.5   Smokeless tobacco: Former   Tobacco comments:    quit smoking 1980,then smokless tobaco until 1996  Substance Use Topics   Alcohol use: Yes    Alcohol/week: 1.0 standard drink of alcohol    Types: 1 Cans of beer per week    Comment: moderate  beer, over 40 years    Family History  Problem Relation Age of Onset   Heart disease Mother    COPD Father    Rectal cancer Sister    Cancer Sister        rectal   Melanoma Daughter [phone]      treated     Review of Systems  Constitutional:  Negative for chills and fever.  Respiratory:  Negative for cough and shortness of breath.   Cardiovascular:  Negative for chest pain.  Gastrointestinal:  Negative for nausea and vomiting.  Musculoskeletal:  Positive for arthralgias.     Objective:  Physical Exam Well nourished and well developed. General: Alert and oriented x3, cooperative and pleasant, no acute distress. Head: normocephalic, atraumatic, neck  supple. Eyes: EOMI.  Musculoskeletal: Left Hip: No tenderness to palpation about the left greater trochanteric bursa. Pain with passive motion of the left hip, particularly hip flexion. Loss of internal rotation and flexion.  Calves soft and nontender. Motor function intact in LE. Strength 5/5 LE bilaterally. Neuro: Distal pulses 2+. Sensation to light touch intact in LE.  Vital signs in last 24 hours:    Labs:  Estimated body mass index is 22.38 kg/m as calculated from the following:   Height as of 04/23/22: '5\' 10"'$  (1.778 m).   Weight as of 04/23/22: 70.8 kg.   Imaging Review Plain radiographs demonstrate severe degenerative joint disease of the left hip(s). The bone quality appears to be adequate for age and reported activity level.      Assessment/Plan:  End stage arthritis, left hip(s)  The patient history, physical examination, clinical judgement of the provider and imaging studies are consistent with end stage degenerative joint disease of the left hip(s) and total hip arthroplasty is deemed medically necessary. The treatment options including medical management, injection therapy, arthroscopy and arthroplasty were discussed at length. The risks and benefits of total hip arthroplasty were presented and reviewed. The risks due to aseptic loosening, infection, stiffness, dislocation/subluxation,  thromboembolic complications and other imponderables were discussed.  The patient acknowledged the explanation, agreed to proceed with the plan and consent was signed. Patient is being admitted for inpatient treatment for surgery, pain control, PT, OT, prophylactic antibiotics, VTE prophylaxis, progressive ambulation and ADL's and discharge planning.The patient is planning to be discharged  home.  Therapy Plans: HEP Disposition: Home with wife Planned DVT Prophylaxis: aspirin '81mg'$  BID DME needed: walker PCP: Dr. Marisue Humble, clearance received GI: Dr. Collene Mares, had r/o eval for LLQ pain TXA:  IV Allergies: PCN - childhood Anesthesia Concerns: NKDA BMI: 22.7 Last HgbA1c: Not diabetic   Other: - No NSAIDs per his GI - hydrocodone, tizanidine, tylenol - Having numbness/tingling in the thigh still   Costella Hatcher, PA-C Orthopedic Surgery [facility] [place] [phone]

## 2022-05-06 ENCOUNTER — Other Ambulatory Visit: Payer: Self-pay

## 2022-05-06 ENCOUNTER — Ambulatory Visit (HOSPITAL_COMMUNITY): Payer: Medicare PPO

## 2022-05-06 ENCOUNTER — Observation Stay (HOSPITAL_COMMUNITY)
Admission: RE | Admit: 2022-05-06 | Discharge: 2022-05-07 | Disposition: A | Discharge status: Home / Self Care | Payer: Medicare PPO | Source: Ambulatory Visit | Attending: Orthopedic Surgery | Admitting: Orthopedic Surgery

## 2022-05-06 ENCOUNTER — Encounter (HOSPITAL_COMMUNITY)
Admission: RE | Disposition: A | Discharge status: Home / Self Care | Payer: Self-pay | Source: Ambulatory Visit | Attending: Orthopedic Surgery

## 2022-05-06 ENCOUNTER — Ambulatory Visit (HOSPITAL_BASED_OUTPATIENT_CLINIC_OR_DEPARTMENT_OTHER): Payer: Medicare PPO | Admitting: Anesthesiology

## 2022-05-06 ENCOUNTER — Encounter (HOSPITAL_COMMUNITY): Payer: Self-pay | Admitting: Orthopedic Surgery

## 2022-05-06 ENCOUNTER — Ambulatory Visit (HOSPITAL_COMMUNITY): Payer: Medicare PPO | Admitting: Physician Assistant

## 2022-05-06 DIAGNOSIS — Z8521 Personal history of malignant neoplasm of larynx: Secondary | ICD-10-CM | POA: Insufficient documentation

## 2022-05-06 DIAGNOSIS — G4730 Sleep apnea, unspecified: Secondary | ICD-10-CM

## 2022-05-06 DIAGNOSIS — Z79899 Other long term (current) drug therapy: Secondary | ICD-10-CM | POA: Diagnosis not present

## 2022-05-06 DIAGNOSIS — Z96611 Presence of right artificial shoulder joint: Secondary | ICD-10-CM | POA: Diagnosis not present

## 2022-05-06 DIAGNOSIS — F419 Anxiety disorder, unspecified: Secondary | ICD-10-CM

## 2022-05-06 DIAGNOSIS — M1612 Unilateral primary osteoarthritis, left hip: Secondary | ICD-10-CM

## 2022-05-06 DIAGNOSIS — Z87891 Personal history of nicotine dependence: Secondary | ICD-10-CM | POA: Insufficient documentation

## 2022-05-06 DIAGNOSIS — G453 Amaurosis fugax: Secondary | ICD-10-CM

## 2022-05-06 DIAGNOSIS — Z96612 Presence of left artificial shoulder joint: Secondary | ICD-10-CM | POA: Insufficient documentation

## 2022-05-06 DIAGNOSIS — Z96642 Presence of left artificial hip joint: Secondary | ICD-10-CM

## 2022-05-06 HISTORY — PX: TOTAL HIP ARTHROPLASTY: SHX124

## 2022-05-06 SURGERY — ARTHROPLASTY, HIP, TOTAL, ANTERIOR APPROACH
Anesthesia: Spinal | Site: Hip | Laterality: Left

## 2022-05-06 MED ORDER — ONDANSETRON HCL 4 MG/2ML IJ SOLN: 4.0000 mg | Freq: Once | INTRAMUSCULAR | Status: DC | PRN

## 2022-05-06 MED ORDER — DIPHENHYDRAMINE HCL 12.5 MG/5ML PO ELIX: 12.5000 mg | ORAL_SOLUTION | ORAL | Status: DC | PRN

## 2022-05-06 MED ORDER — PANTOPRAZOLE SODIUM 40 MG PO TBEC
40.0000 mg | DELAYED_RELEASE_TABLET | Freq: Every day | ORAL | Status: DC
  Administered 2022-05-07: 40 mg via ORAL
  Filled 2022-05-06: qty 1

## 2022-05-06 MED ORDER — PHENOL 1.4 % MT LIQD: 1.0000 | OROMUCOSAL | Status: DC | PRN

## 2022-05-06 MED ORDER — CHLORHEXIDINE GLUCONATE 0.12 % MT SOLN
15.0000 mL | Freq: Once | OROMUCOSAL | Status: AC
  Administered 2022-05-06: 15 mL via OROMUCOSAL

## 2022-05-06 MED ORDER — ONDANSETRON HCL 4 MG/2ML IJ SOLN: 4.0000 mg | Freq: Four times a day (QID) | INTRAMUSCULAR | Status: DC | PRN

## 2022-05-06 MED ORDER — CEFAZOLIN SODIUM-DEXTROSE 2-4 GM/100ML-% IV SOLN
2.0000 g | INTRAVENOUS | Status: AC
  Administered 2022-05-06: 2 g via INTRAVENOUS
  Filled 2022-05-06: qty 100

## 2022-05-06 MED ORDER — ORAL CARE MOUTH RINSE: 15.0000 mL | Freq: Once | OROMUCOSAL | Status: AC

## 2022-05-06 MED ORDER — ASPIRIN 81 MG PO CHEW
81.0000 mg | CHEWABLE_TABLET | Freq: Two times a day (BID) | ORAL | Status: DC
  Administered 2022-05-06 – 2022-05-07 (×2): 81 mg via ORAL
  Filled 2022-05-06 (×2): qty 1

## 2022-05-06 MED ORDER — PHENYLEPHRINE HCL-NACL 20-0.9 MG/250ML-% IV SOLN
INTRAVENOUS | Status: DC | PRN
  Administered 2022-05-06: 30 ug/min via INTRAVENOUS

## 2022-05-06 MED ORDER — FENTANYL CITRATE (PF) 100 MCG/2ML IJ SOLN
INTRAMUSCULAR | Status: DC | PRN
Start: 2022-05-06 — End: 2022-05-06
  Administered 2022-05-06 (×2): 25 ug via INTRAVENOUS
  Administered 2022-05-06: 50 ug via INTRAVENOUS

## 2022-05-06 MED ORDER — SIMVASTATIN 20 MG PO TABS
20.0000 mg | ORAL_TABLET | Freq: Every day | ORAL | Status: DC
  Administered 2022-05-06 – 2022-05-07 (×2): 20 mg via ORAL
  Filled 2022-05-06 (×2): qty 1

## 2022-05-06 MED ORDER — OXYCODONE HCL 5 MG/5ML PO SOLN: 5.0000 mg | Freq: Once | ORAL | Status: DC | PRN

## 2022-05-06 MED ORDER — DEXAMETHASONE SODIUM PHOSPHATE 10 MG/ML IJ SOLN
8.0000 mg | Freq: Once | INTRAMUSCULAR | Status: AC
  Administered 2022-05-06: 8 mg via INTRAVENOUS

## 2022-05-06 MED ORDER — ONDANSETRON HCL 4 MG/2ML IJ SOLN
INTRAMUSCULAR | Status: DC | PRN
  Administered 2022-05-06: 4 mg via INTRAVENOUS

## 2022-05-06 MED ORDER — LACTATED RINGERS IV SOLN: INTRAVENOUS | Status: DC

## 2022-05-06 MED ORDER — FLUTICASONE PROPIONATE 50 MCG/ACT NA SUSP
2.0000 | Freq: Every day | NASAL | Status: DC
Start: 2022-05-06 — End: 2022-05-07
  Filled 2022-05-06: qty 16

## 2022-05-06 MED ORDER — FENTANYL CITRATE (PF) 100 MCG/2ML IJ SOLN
INTRAMUSCULAR | Status: AC
  Filled 2022-05-06: qty 2

## 2022-05-06 MED ORDER — ONDANSETRON HCL 4 MG PO TABS: 4.0000 mg | ORAL_TABLET | Freq: Four times a day (QID) | ORAL | Status: DC | PRN

## 2022-05-06 MED ORDER — CEFAZOLIN SODIUM-DEXTROSE 2-4 GM/100ML-% IV SOLN
2.0000 g | Freq: Four times a day (QID) | INTRAVENOUS | Status: AC
  Administered 2022-05-06 – 2022-05-07 (×2): 2 g via INTRAVENOUS
  Filled 2022-05-06 (×2): qty 100

## 2022-05-06 MED ORDER — SODIUM CHLORIDE 0.9 % IR SOLN
Status: DC | PRN
  Administered 2022-05-06: 1000 mL

## 2022-05-06 MED ORDER — TIZANIDINE HCL 4 MG PO TABS: 4.0000 mg | ORAL_TABLET | Freq: Three times a day (TID) | ORAL | Status: DC | PRN

## 2022-05-06 MED ORDER — BISACODYL 10 MG RE SUPP: 10.0000 mg | Freq: Every day | RECTAL | Status: DC | PRN

## 2022-05-06 MED ORDER — MENTHOL 3 MG MT LOZG: 1.0000 | LOZENGE | OROMUCOSAL | Status: DC | PRN

## 2022-05-06 MED ORDER — HYDROCODONE-ACETAMINOPHEN 7.5-325 MG PO TABS
1.0000 | ORAL_TABLET | ORAL | Status: DC | PRN
  Administered 2022-05-06: 2 via ORAL
  Filled 2022-05-06: qty 2

## 2022-05-06 MED ORDER — POLYETHYLENE GLYCOL 3350 17 G PO PACK
17.0000 g | PACK | Freq: Every day | ORAL | Status: DC | PRN
Start: 2022-05-06 — End: 2022-05-07

## 2022-05-06 MED ORDER — HYDROCODONE-ACETAMINOPHEN 5-325 MG PO TABS
1.0000 | ORAL_TABLET | ORAL | Status: DC | PRN
  Administered 2022-05-07: 2 via ORAL
  Filled 2022-05-06: qty 2

## 2022-05-06 MED ORDER — POVIDONE-IODINE 10 % EX SWAB: Freq: Once | CUTANEOUS | Status: AC

## 2022-05-06 MED ORDER — GLYCOPYRROLATE PF 0.2 MG/ML IJ SOSY
PREFILLED_SYRINGE | INTRAMUSCULAR | Status: DC | PRN
  Administered 2022-05-06: .2 mg via INTRAVENOUS

## 2022-05-06 MED ORDER — PROPOFOL 10 MG/ML IV BOLUS
INTRAVENOUS | Status: DC | PRN
  Administered 2022-05-06 (×4): 20 mg via INTRAVENOUS
  Administered 2022-05-06: 30 mg via INTRAVENOUS

## 2022-05-06 MED ORDER — SODIUM CHLORIDE 0.9 % IV SOLN: INTRAVENOUS | Status: DC

## 2022-05-06 MED ORDER — TRANEXAMIC ACID-NACL 1000-0.7 MG/100ML-% IV SOLN
1000.0000 mg | Freq: Once | INTRAVENOUS | Status: AC
  Administered 2022-05-06: 1000 mg via INTRAVENOUS
  Filled 2022-05-06: qty 100

## 2022-05-06 MED ORDER — PROPOFOL 500 MG/50ML IV EMUL
INTRAVENOUS | Status: DC | PRN
  Administered 2022-05-06: 35 ug/kg/min via INTRAVENOUS

## 2022-05-06 MED ORDER — ACETAMINOPHEN 325 MG PO TABS: 325.0000 mg | ORAL_TABLET | Freq: Four times a day (QID) | ORAL | Status: DC | PRN

## 2022-05-06 MED ORDER — OXYCODONE HCL 5 MG PO TABS: 5.0000 mg | ORAL_TABLET | Freq: Once | ORAL | Status: DC | PRN

## 2022-05-06 MED ORDER — TRAZODONE HCL 50 MG PO TABS
50.0000 mg | ORAL_TABLET | Freq: Every day | ORAL | Status: DC
  Administered 2022-05-06: 50 mg via ORAL
  Filled 2022-05-06: qty 1

## 2022-05-06 MED ORDER — PROPOFOL 1000 MG/100ML IV EMUL
INTRAVENOUS | Status: AC
  Filled 2022-05-06: qty 100

## 2022-05-06 MED ORDER — METOCLOPRAMIDE HCL 5 MG PO TABS: 5.0000 mg | ORAL_TABLET | Freq: Three times a day (TID) | ORAL | Status: DC | PRN

## 2022-05-06 MED ORDER — ACETAMINOPHEN 500 MG PO TABS: 1000.0000 mg | ORAL_TABLET | Freq: Once | ORAL | Status: DC

## 2022-05-06 MED ORDER — METOCLOPRAMIDE HCL 5 MG/ML IJ SOLN: 5.0000 mg | Freq: Three times a day (TID) | INTRAMUSCULAR | Status: DC | PRN

## 2022-05-06 MED ORDER — DOCUSATE SODIUM 100 MG PO CAPS
100.0000 mg | ORAL_CAPSULE | Freq: Two times a day (BID) | ORAL | Status: DC
  Administered 2022-05-06 – 2022-05-07 (×2): 100 mg via ORAL
  Filled 2022-05-06 (×2): qty 1

## 2022-05-06 MED ORDER — TAMSULOSIN HCL 0.4 MG PO CAPS
0.4000 mg | ORAL_CAPSULE | Freq: Two times a day (BID) | ORAL | Status: DC
  Administered 2022-05-07: 0.4 mg via ORAL
  Filled 2022-05-06 (×2): qty 1

## 2022-05-06 MED ORDER — FERROUS SULFATE 325 (65 FE) MG PO TABS
325.0000 mg | ORAL_TABLET | Freq: Three times a day (TID) | ORAL | Status: DC
  Administered 2022-05-07: 325 mg via ORAL
  Filled 2022-05-06: qty 1

## 2022-05-06 MED ORDER — TRANEXAMIC ACID-NACL 1000-0.7 MG/100ML-% IV SOLN
1000.0000 mg | INTRAVENOUS | Status: AC
  Administered 2022-05-06: 1000 mg via INTRAVENOUS
  Filled 2022-05-06: qty 100

## 2022-05-06 MED ORDER — PHENYLEPHRINE HCL (PRESSORS) 10 MG/ML IV SOLN
INTRAVENOUS | Status: AC
Start: 2022-05-06 — End: ?
  Filled 2022-05-06: qty 1

## 2022-05-06 MED ORDER — FENTANYL CITRATE PF 50 MCG/ML IJ SOSY: 25.0000 ug | PREFILLED_SYRINGE | INTRAMUSCULAR | Status: DC | PRN

## 2022-05-06 MED ORDER — MORPHINE SULFATE (PF) 2 MG/ML IV SOLN: 0.5000 mg | INTRAVENOUS | Status: DC | PRN

## 2022-05-06 MED ORDER — PHENYLEPHRINE HCL (PRESSORS) 10 MG/ML IV SOLN
INTRAVENOUS | Status: AC
  Filled 2022-05-06: qty 1

## 2022-05-06 MED ORDER — DEXAMETHASONE SODIUM PHOSPHATE 10 MG/ML IJ SOLN
10.0000 mg | Freq: Once | INTRAMUSCULAR | Status: AC
  Administered 2022-05-07: 10 mg via INTRAVENOUS
  Filled 2022-05-06: qty 1

## 2022-05-06 SURGICAL SUPPLY — 44 items
BAG COUNTER SPONGE SURGICOUNT (BAG) ×1 IMPLANT
BAG DECANTER FOR FLEXI CONT (MISCELLANEOUS) IMPLANT
BAG ZIPLOCK 12X15 (MISCELLANEOUS) IMPLANT
BLADE SAG 18X100X1.27 (BLADE) ×2 IMPLANT
COVER PERINEAL POST (MISCELLANEOUS) ×2 IMPLANT
COVER SURGICAL LIGHT HANDLE (MISCELLANEOUS) ×2 IMPLANT
CUP ACETBLR 54 OD PINNACLE (Hips) ×1 IMPLANT
DERMABOND ADVANCED (GAUZE/BANDAGES/DRESSINGS) ×1
DERMABOND ADVANCED .7 DNX12 (GAUZE/BANDAGES/DRESSINGS) ×1 IMPLANT
DRAPE FOOT SWITCH (DRAPES) ×2 IMPLANT
DRAPE STERI IOBAN 125X83 (DRAPES) ×2 IMPLANT
DRAPE U-SHAPE 47X51 STRL (DRAPES) ×4 IMPLANT
DRESSING AQUACEL AG SP 3.5X10 (GAUZE/BANDAGES/DRESSINGS) ×1 IMPLANT
DRSG AQUACEL AG ADV 3.5X10 (GAUZE/BANDAGES/DRESSINGS) ×1 IMPLANT
DRSG AQUACEL AG SP 3.5X10 (GAUZE/BANDAGES/DRESSINGS) ×2
DURAPREP 26ML APPLICATOR (WOUND CARE) ×2 IMPLANT
ELECT REM PT RETURN 15FT ADLT (MISCELLANEOUS) ×2 IMPLANT
ELIMINATOR HOLE APEX DEPUY (Hips) ×1 IMPLANT
GLOVE BIO SURGEON STRL SZ 6 (GLOVE) ×2 IMPLANT
GLOVE BIOGEL PI IND STRL 6.5 (GLOVE) ×1 IMPLANT
GLOVE BIOGEL PI IND STRL 7.5 (GLOVE) ×1 IMPLANT
GLOVE BIOGEL PI INDICATOR 6.5 (GLOVE) ×1
GLOVE BIOGEL PI INDICATOR 7.5 (GLOVE) ×1
GLOVE ORTHO TXT STRL SZ7.5 (GLOVE) ×4 IMPLANT
GOWN STRL REUS W/ TWL LRG LVL3 (GOWN DISPOSABLE) ×3 IMPLANT
GOWN STRL REUS W/TWL LRG LVL3 (GOWN DISPOSABLE) ×6
HEAD M SROM 36MM PLUS 1.5 (Hips) IMPLANT
HOLDER FOLEY CATH W/STRAP (MISCELLANEOUS) ×2 IMPLANT
KIT TURNOVER KIT A (KITS) IMPLANT
LINER NEUTRAL 54X36MM PLUS 4 (Hips) ×1 IMPLANT
PACK ANTERIOR HIP CUSTOM (KITS) ×2 IMPLANT
SCREW 6.5MMX30MM (Screw) ×1 IMPLANT
SLEEVE SUCTION 125 (MISCELLANEOUS) ×1 IMPLANT
SROM M HEAD 36MM PLUS 1.5 (Hips) ×2 IMPLANT
STEM FEMORAL SZ5 HIGH ACTIS (Stem) ×1 IMPLANT
SUT MNCRL AB 4-0 PS2 18 (SUTURE) ×2 IMPLANT
SUT STRATAFIX 0 PDS 27 VIOLET (SUTURE) ×2
SUT VIC AB 1 CT1 36 (SUTURE) ×6 IMPLANT
SUT VIC AB 2-0 CT1 27 (SUTURE) ×4
SUT VIC AB 2-0 CT1 TAPERPNT 27 (SUTURE) ×2 IMPLANT
SUTURE STRATFX 0 PDS 27 VIOLET (SUTURE) ×1 IMPLANT
TRAY FOLEY MTR SLVR 16FR STAT (SET/KITS/TRAYS/PACK) ×1 IMPLANT
TUBE SUCTION HIGH CAP CLEAR NV (SUCTIONS) ×2 IMPLANT
WATER STERILE IRR 1000ML POUR (IV SOLUTION) ×2 IMPLANT

## 2022-05-06 NOTE — Transfer of Care (Signed)
Immediate Anesthesia Transfer of Care Note  Patient: Peter Drake  Procedure(s) Performed: TOTAL HIP ARTHROPLASTY ANTERIOR APPROACH (Left: Hip)  Patient Location: PACU  Anesthesia Type:Spinal and MAC combined with regional for post-op pain  Level of Consciousness: awake, alert , oriented and patient cooperative  Airway & Oxygen Therapy: Patient Spontanous Breathing  Post-op Assessment: Report given to RN and Post -op Vital signs reviewed and stable  Post vital signs: Reviewed and stable  Last Vitals:  Vitals Value Taken Time  BP    Temp    Pulse 66 05/06/22 1605  Resp 14 05/06/22 1605  SpO2 100 % 05/06/22 1605  Vitals shown include unvalidated device data.  Last Pain:  Vitals:   05/06/22 1236  TempSrc: Oral  PainSc:       Patients Stated Pain Goal: 4 (63/81/77 1165)  Complications: No notable events documented.

## 2022-05-06 NOTE — Discharge Instructions (Signed)

## 2022-05-06 NOTE — Anesthesia Preprocedure Evaluation (Addendum)
Anesthesia Evaluation  Patient identified by MRN, date of birth, ID band Patient awake    Reviewed: Allergy & Precautions, NPO status , Patient's Chart, lab work & pertinent test results  History of Anesthesia Complications Negative for: history of anesthetic complications  Airway Mallampati: II  TM Distance: >3 FB Neck ROM: Full    Dental  (+) Dental Advisory Given, Caps   Pulmonary sleep apnea , former smoker,    Pulmonary exam normal        Cardiovascular negative cardio ROS Normal cardiovascular exam   '21 TTE - EF 60 to 65%. Left ventricular diastolic parameters are indeterminate. 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     Neuro/Psych  Headaches, PSYCHIATRIC DISORDERS Anxiety  Amaurosis fugax     GI/Hepatic Neg liver ROS, GERD  Medicated and Controlled,  Endo/Other  negative endocrine ROS  Renal/GU negative Renal ROS     Musculoskeletal  (+) Arthritis , Osteoarthritis,    Abdominal   Peds  Hematology negative hematology ROS (+)   Anesthesia Other Findings L vocoal cord sq cell ca  Reproductive/Obstetrics                            Anesthesia Physical Anesthesia Plan  ASA: 3  Anesthesia Plan: Spinal   Post-op Pain Management: Tylenol PO (pre-op)*   Induction:   PONV Risk Score and Plan: 1 and Treatment may vary due to age or medical condition and Propofol infusion  Airway Management Planned: Natural Airway and Simple Face Mask  Additional Equipment: None  Intra-op Plan:   Post-operative Plan:   Informed Consent: I have reviewed the patients History and Physical, chart, labs and discussed the procedure including the risks, benefits and alternatives for the proposed anesthesia with the patient or authorized representative who has indicated his/her understanding and acceptance.       Plan Discussed with: CRNA and  Anesthesiologist  Anesthesia Plan Comments: (Labs reviewed, platelets acceptable. Discussed risks and benefits of spinal, including spinal/epidural hematoma, infection, failed block, and PDPH. Patient expressed understanding and wished to proceed. )       Anesthesia Quick Evaluation

## 2022-05-06 NOTE — Anesthesia Postprocedure Evaluation (Signed)
Anesthesia Post Note  Patient: Peter Drake  Procedure(s) Performed: TOTAL HIP ARTHROPLASTY ANTERIOR APPROACH (Left: Hip)     Patient location during evaluation: PACU Anesthesia Type: Spinal Level of consciousness: awake and alert Pain management: pain level controlled Vital Signs Assessment: post-procedure vital signs reviewed and stable Respiratory status: spontaneous breathing and respiratory function stable Cardiovascular status: blood pressure returned to baseline and stable Postop Assessment: spinal receding and no apparent nausea or vomiting Anesthetic complications: no   No notable events documented.  Last Vitals:  Vitals:   05/06/22 1740 05/06/22 1745  BP:  (!) 154/86  Pulse: (!) 57 (!) 50  Resp: 13 16  Temp:    SpO2: 100% 100%    Last Pain:  Vitals:   05/06/22 1745  TempSrc:   PainSc: 0-No pain                 Audry Pili

## 2022-05-06 NOTE — Plan of Care (Signed)
  Problem: Education: Goal: Knowledge of General Education information will improve Description: Including pain rating scale, medication(s)/side effects and non-pharmacologic comfort measures Outcome: Progressing   Problem: Activity: Goal: Risk for activity intolerance will decrease Outcome: Progressing   Problem: Elimination: Goal: Will not experience complications related to bowel motility Outcome: Progressing   Problem: Pain Managment: Goal: General experience of comfort will improve Outcome: Progressing   Problem: Education: Goal: Knowledge of the prescribed therapeutic regimen will improve Outcome: Progressing   Problem: Activity: Goal: Ability to avoid complications of mobility impairment will improve Outcome: Progressing

## 2022-05-06 NOTE — Op Note (Signed)
NAME:  Peter Drake                ACCOUNT NO.: [id number]      MEDICAL RECORD NO.: [id number]      FACILITY:  [facility]      PHYSICIAN:  Mauri Pole  DATE OF BIRTH:  Jan 08, 1946     DATE OF PROCEDURE:  05/06/2022                                 OPERATIVE REPORT         PREOPERATIVE DIAGNOSIS: Left  hip osteoarthritis.      POSTOPERATIVE DIAGNOSIS:  Left hip osteoarthritis.      PROCEDURE:  Left total hip replacement through an anterior approach   utilizing DePuy THR system, component size 54 mm pinnacle cup, a size 36+4 neutral   Altrex liner, a size 5 Hi Actis stem with a 36+1.5 Articuleze metal head ball      SURGEON:  Pietro Cassis. Alvan Dame, M.D.      ASSISTANT:  Costella Hatcher, PA-C     ANESTHESIA:  Spinal.      SPECIMENS:  None.      COMPLICATIONS:  None.      BLOOD LOSS:  250 cc     DRAINS:  None.      INDICATION OF THE PROCEDURE:  Peter Drake is a 76 y.o. male who had   presented to office for evaluation of left hip pain.  Radiographs revealed   progressive degenerative changes with bone-on-bone   articulation of the  hip joint, including subchondral cystic changes and osteophytes.  The patient had painful limited range of   motion significantly affecting their overall quality of life and function.  The patient was failing to    respond to conservative measures including medications and/or injections and activity modification and at this point was ready   to proceed with more definitive measures.  Consent was obtained for   benefit of pain relief.  Specific risks of infection, DVT, component   failure, dislocation, neurovascular injury, and need for revision surgery were reviewed in the office.     PROCEDURE IN DETAIL:  The patient was brought to operative theater.   Once adequate anesthesia, preoperative antibiotics, 2 gm of Ancef, 1 gm of Tranexamic Acid, and 10 mg of Decadron were administered, the patient was positioned supine on the  [employer] table.  Once the patient was safely positioned with adequate padding of boney prominences we predraped out the hip, and used fluoroscopy to confirm orientation of the pelvis.      The left hip was then prepped and draped from proximal iliac crest to   mid thigh with a shower curtain technique.      Time-out was performed identifying the patient, planned procedure, and the appropriate extremity.     An incision was then made 2 cm lateral to the   anterior superior iliac spine extending over the orientation of the   tensor fascia lata muscle and sharp dissection was carried down to the   fascia of the muscle.      The fascia was then incised.  The muscle belly was identified and swept   laterally and retractor placed along the superior neck.  Following   cauterization of the circumflex vessels and removing some pericapsular   fat, a second cobra retractor was placed on the inferior neck.  A T-capsulotomy was made along the line of the   superior neck to the trochanteric fossa, then extended proximally and   distally.  Tag sutures were placed and the retractors were then placed   intracapsular.  We then identified the trochanteric fossa and   orientation of my neck cut and then made a neck osteotomy with the femur on traction.  The femoral   head was removed without difficulty or complication.  Traction was let   off and retractors were placed posterior and anterior around the   acetabulum.      The labrum and foveal tissue were debrided.  I began reaming with a 49 mm   reamer and reamed up to 53 mm reamer with good bony bed preparation and a 54 mm  cup was chosen.  The final 54 mm Pinnacle cup was then impacted under fluoroscopy to confirm the depth of penetration and orientation with respect to   Abduction and forward flexion.  A screw was placed into the ilium followed by the hole eliminator.  The final   36+4 neutral Altrex liner was impacted with good visualized rim fit.  The  cup was positioned anatomically within the acetabular portion of the pelvis.      At this point, the femur was rolled to 100 degrees.  Further capsule was   released off the inferior aspect of the femoral neck.  I then   released the superior capsule proximally.  With the leg in a neutral position the hook was placed laterally   along the femur under the vastus lateralis origin and elevated manually and then held in position using the hook attachment on the bed.  The leg was then extended and adducted with the leg rolled to 100   degrees of external rotation.  Retractors were placed along the medial calcar and posteriorly over the greater trochanter.  Once the proximal femur was fully   exposed, I used a box osteotome to set orientation.  I then began   broaching with the starting chili pepper broach and passed this by hand and then broached up to 5.  With the 5 broach in place I chose a high offset neck and did several trial reductions.  The offset was appropriate, leg lengths   appeared to be equal best matched with the +1.5 head ball trial confirmed radiographically.   Given these findings, I went ahead and dislocated the hip, repositioned all   retractors and positioned the right hip in the extended and abducted position.  The final 5 Hi Actis stem was   chosen and it was impacted down to the level of neck cut.  Based on this   and the trial reductions, a final 36+1.5 Articuleze metal head ball was chosen and   impacted onto a clean and dry trunnion, and the hip was reduced.  The   hip had been irrigated throughout the case again at this point.  I did   reapproximate the superior capsular leaflet to the anterior leaflet   using #1 Vicryl.  The fascia of the   tensor fascia lata muscle was then reapproximated using #1 Vicryl and #0 Stratafix sutures.  The   remaining wound was closed with 2-0 Vicryl and running 4-0 Monocryl.   The hip was cleaned, dried, and dressed sterilely using Dermabond  and   Aquacel dressing.  The patient was then brought   to recovery room in stable condition tolerating the procedure well.    Costella Hatcher,  PA-C was present for the entirety of the case involved from   preoperative positioning, perioperative retractor management, general   facilitation of the case, as well as primary wound closure as assistant.            Pietro Cassis Alvan Dame, M.D.        05/06/2022 3:42 PM

## 2022-05-06 NOTE — Interval H&P Note (Signed)
History and Physical Interval Note:  05/06/2022 12:50 PM  Peter Drake  has presented today for surgery, with the diagnosis of Left hip osteoarthritis.  The various methods of treatment have been discussed with the patient and family. After consideration of risks, benefits and other options for treatment, the patient has consented to  Procedure(s): TOTAL HIP ARTHROPLASTY ANTERIOR APPROACH (Left) as a surgical intervention.  The patient's history has been reviewed, patient examined, no change in status, stable for surgery.  I have reviewed the patient's chart and labs.  Questions were answered to the patient's satisfaction.     Mauri Pole

## 2022-05-06 NOTE — Anesthesia Procedure Notes (Signed)
Spinal  Patient location during procedure: OR Start time: 05/06/2022 2:25 PM End time: 05/06/2022 2:28 PM Reason for block: surgical anesthesia Staffing Performed: resident/CRNA  Resident/CRNA: Cleda Daub, CRNA Performed by: Cleda Daub, CRNA Authorized by: Audry Pili, MD   Preanesthetic Checklist Completed: patient identified, IV checked, site marked, risks and benefits discussed, surgical consent, monitors and equipment checked, pre-op evaluation and timeout performed Spinal Block Patient position: sitting Prep: DuraPrep Patient monitoring: heart rate, cardiac monitor, continuous pulse ox and blood pressure Approach: midline Location: L3-4 Injection technique: single-shot Needle Needle type: Pencan  Needle gauge: 24 G Needle length: 10 cm Assessment Sensory level: T4 Events: CSF return Additional Notes Checked expiration dates on spinal kit and dura prep; maintained aseptic technique; anesthetized the injection site with lido; saw swirl of clear CSF prior to injecting bupivacaine; pt tolerated well.

## 2022-05-07 ENCOUNTER — Encounter (HOSPITAL_COMMUNITY): Payer: Self-pay | Admitting: Orthopedic Surgery

## 2022-05-07 DIAGNOSIS — M1612 Unilateral primary osteoarthritis, left hip: Secondary | ICD-10-CM | POA: Diagnosis not present

## 2022-05-07 LAB — BASIC METABOLIC PANEL
Anion gap: 7 (ref 5–15)
BUN: 11 mg/dL (ref 8–23)
CO2: 23 mmol/L (ref 22–32)
Calcium: 8.4 mg/dL — ABNORMAL LOW (ref 8.9–10.3)
Chloride: 107 mmol/L (ref 98–111)
Creatinine, Ser: 0.7 mg/dL (ref 0.61–1.24)
GFR, Estimated: >60 mL/min (ref >60)
Glucose, Bld: 134 mg/dL — ABNORMAL HIGH (ref 70–99)
Potassium: 4.2 mmol/L (ref 3.5–5.1)
Sodium: 137 mmol/L (ref 135–145)

## 2022-05-07 LAB — CBC
HCT: 40.1 % (ref 39.0–52.0)
Hemoglobin: 13.7 g/dL (ref 13.0–17.0)
MCH: 31.6 pg (ref 26.0–34.0)
MCHC: 34.2 g/dL (ref 30.0–36.0)
MCV: 92.4 fL (ref 80.0–100.0)
Platelets: 193 10*3/uL (ref 150–400)
RBC: 4.34 MIL/uL (ref 4.22–5.81)
RDW: 12.7 % (ref 11.5–15.5)
WBC: 10.9 10*3/uL — ABNORMAL HIGH (ref 4.0–10.5)
nRBC: 0 % (ref 0.0–0.2)

## 2022-05-07 MED ORDER — ASPIRIN 81 MG PO CHEW: 81.0000 mg | CHEWABLE_TABLET | Freq: Two times a day (BID) | ORAL | 0 refills | Status: AC

## 2022-05-07 MED ORDER — POLYETHYLENE GLYCOL 3350 17 G PO PACK: 17.0000 g | PACK | Freq: Every day | ORAL | 0 refills | Status: DC | PRN

## 2022-05-07 MED ORDER — DOCUSATE SODIUM 100 MG PO CAPS: 100.0000 mg | ORAL_CAPSULE | Freq: Two times a day (BID) | ORAL | 0 refills | Status: AC

## 2022-05-07 MED ORDER — HYDROCODONE-ACETAMINOPHEN 5-325 MG PO TABS: 1.0000 | ORAL_TABLET | ORAL | 0 refills | Status: DC | PRN

## 2022-05-07 NOTE — Progress Notes (Signed)
   Subjective: 1 Day Post-Op Procedure(s) (LRB): TOTAL HIP ARTHROPLASTY ANTERIOR APPROACH (Left) Patient reports pain as mild.   Patient seen in rounds with Dr. Alvan Dame. Patient is resting in bed on exam this morning. No acute events overnight. He tells me his neck/shoulders are somewhat improved after the prednisone pre-operatively . We will start therapy today.   Objective: Vital signs in last 24 hours: Temp:  [97.6 F (36.4 C)-98.4 F (36.9 C)] 98.3 F (36.8 C) (07/19 0603) Pulse Rate:  [45-70] 70 (07/19 0603) Resp:  [9-20] 18 (07/19 0603) BP: (104-157)/(70-90) 127/90 (07/19 0603) SpO2:  [96 %-100 %] 99 % (07/19 0603) Weight:  [70.8 kg] 70.8 kg (07/18 1211)  Intake/Output from previous day:  Intake/Output Summary (Last 24 hours) at 05/07/2022 0811 Last data filed at 05/07/2022 0600 Gross per 24 hour  Intake 3411.06 ml  Output 3975 ml  Net -563.94 ml     Intake/Output this shift: No intake/output data recorded.  Labs: Recent Labs    05/07/22 0334  HGB 13.7   Recent Labs    05/07/22 0334  WBC 10.9*  RBC 4.34  HCT 40.1  PLT 193   Recent Labs    05/07/22 0334  NA 137  K 4.2  CL 107  CO2 23  BUN 11  CREATININE 0.70  GLUCOSE 134*  CALCIUM 8.4*   No results for input(s): "LABPT", "INR" in the last 72 hours.  Exam: General - Patient is Alert and Oriented Extremity - Neurologically intact Sensation intact distally Intact pulses distally Dorsiflexion/Plantar flexion intact Dressing - dressing C/D/I Motor Function - intact, moving foot and toes well on exam.   Past Medical History:  Diagnosis Date   Allergy    pcn   Amaurosis fugax    Anxiety    Arthritis    HX: of OA   BPH (benign prostatic hypertrophy)    Hx: of   Cancer ([place]) 06/23/2008   L vocoal cord sq cell ca   Colorectal polyps 10/2001   Dyslipidemia    Early cataracts, bilateral    Hx: of   GERD (gastroesophageal reflux disease)    H/O Mohs micrographic surgery for skin cancer     Headache    has aura's associated with migraines   History of radiation therapy 08/04/08-09/20/08   larynx   Hyperlipidemia    PVC (premature ventricular contraction)    Radiation 08/04/08-09/20/08   Larynx   33 treatments   Sleep apnea    borderline no cpap needed    Assessment/Plan: 1 Day Post-Op Procedure(s) (LRB): TOTAL HIP ARTHROPLASTY ANTERIOR APPROACH (Left) Principal Problem:   S/P total left hip arthroplasty  Estimated body mass index is 22.38 kg/m as calculated from the following:   Height as of this encounter: '5\' 10"'$  (1.778 m).   Weight as of this encounter: 70.8 kg. Advance diet Up with therapy D/C IV fluids  DVT Prophylaxis - Aspirin Weight bearing as tolerated.  Hgb stable at 13.7 this AM.  Plan is to go Home after hospital stay. Plan for discharge today after meeting goals with therapy. Follow up in the office in 2 weeks.   Griffith Citron, PA-C Orthopedic Surgery [phone] 05/07/2022, 8:11 AM

## 2022-05-07 NOTE — Progress Notes (Signed)
Physical Therapy Treatment Patient Details Name: Peter Drake MRN: [id number] DOB: Aug 08, 1946 Today's Date: 05/07/2022   History of Present Illness 76 yo male  s/p L THA-DA 05/06/22. Hx of L TSA 2012    PT Comments    2nd session to practice stair negotiation. Verbally reviewed shower transfer. Issued HEP for pt to perform 2x/day. Instructed pt to try to ambulate often using his RW. All education completed.    Recommendations for follow up therapy are one component of a multi-disciplinary discharge planning process, led by the attending physician.  Recommendations may be updated based on patient status, additional functional criteria and insurance authorization.  Follow Up Recommendations  Follow physician's recommendations for discharge plan and follow up therapies     Assistance Recommended at Discharge PRN  Patient can return home with the following A little help with walking and/or transfers;A little help with bathing/dressing/bathroom;Help with stairs or ramp for entrance;Assistance with cooking/housework;Assist for transportation   Equipment Recommendations  Rolling walker (2 wheels)    Recommendations for Other Services       Precautions / Restrictions Precautions Precautions: None Restrictions Weight Bearing Restrictions: No LLE Weight Bearing: Weight bearing as tolerated     Mobility  Bed Mobility Overal bed mobility: Needs Assistance Bed Mobility: Supine to Sit           General bed mobility comments: Supv for cues.    Transfers Overall transfer level: Needs assistance Equipment used: Rolling walker (2 wheels) Transfers: Sit to/from Stand Sit to Stand: Supervision           General transfer comment: Supv for cues.    Ambulation/Gait Ambulation/Gait assistance: Supervision Gait Distance (Feet): 200 Feet Assistive device: Rolling walker (2 wheels) Gait Pattern/deviations: Step-through pattern, Decreased stride length       General Gait  Details: Supv for safety. Cues for sequencing, RW proximity   Stairs Stairs: Yes Stairs assistance: Min assist Stair Management: Step to pattern, With walker Number of Stairs: 3 General stair comments: up and down stairs with RW. cues for safety, technique, sequence. Min A to steady RW only.   Wheelchair Mobility    Modified Rankin (Stroke Patients Only)       Balance Overall balance assessment: Mild deficits observed, not formally tested                                          Cognition Arousal/Alertness: Awake/alert Behavior During Therapy: WFL for tasks assessed/performed Overall Cognitive Status: Within Functional Limits for tasks assessed                                          Exercises Total Joint Exercises Hip ABduction/ADduction: AROM, Left, 10 reps, Standing Long Arc Quad: AROM, Left, 10 reps, Seated Knee Flexion: AROM, Left, Standing, 10 reps Marching in Standing: AROM, Both, 10 reps, Standing    General Comments        Pertinent Vitals/Pain Pain Assessment Pain Assessment: 0-10 Pain Score: 3  Pain Location: L hip Pain Descriptors / Indicators: Discomfort, Sore Pain Intervention(s): Monitored during session    Home Living Family/patient expects to be discharged to:: Private residence Living Arrangements: Spouse/significant other   Type of Home: House Home Access: Stairs to enter Entrance Stairs-Rails: None Entrance Stairs-Number of Steps: 2   Home  Layout: One level Home Equipment: None;Cane - single point      Prior Function            PT Goals (current goals can now be found in the care plan section) Acute Rehab PT Goals Patient Stated Goal: regain independence. Get back to playing golf. PT Goal Formulation: With patient Time For Goal Achievement: 05/21/22 Potential to Achieve Goals: Good Progress towards PT goals: Progressing toward goals    Frequency    7X/week      PT Plan Current  plan remains appropriate    Co-evaluation              AM-PAC PT "6 Clicks" Mobility   Outcome Measure  Help needed turning from your back to your side while in a flat bed without using bedrails?: None Help needed moving from lying on your back to sitting on the side of a flat bed without using bedrails?: None Help needed moving to and from a bed to a chair (including a wheelchair)?: A Little Help needed standing up from a chair using your arms (e.g., wheelchair or bedside chair)?: A Little Help needed to walk in hospital room?: A Little Help needed climbing 3-5 steps with a railing? : A Little 6 Click Score: 20    End of Session Equipment Utilized During Treatment: Gait belt Activity Tolerance: Patient tolerated treatment well Patient left: in chair;with call bell/phone within reach   PT Visit Diagnosis: Pain;Other abnormalities of gait and mobility (R26.89) Pain - Right/Left: Left Pain - part of body: Hip     Time: 7169-6789 PT Time Calculation (min) (ACUTE ONLY): 17 min  Charges:  $Gait Training: 8-22 mins                         Doreatha Massed, PT Acute Rehabilitation  Office: [phone] Pager: [phone]

## 2022-05-07 NOTE — Evaluation (Signed)
Physical Therapy Evaluation Patient Details Name: Peter Drake MRN: [id number] DOB: 10/03/1946 Today's Date: 05/07/2022  History of Present Illness  76 yo male  s/p L THA-DA 05/06/22. Hx of L TSA 2012  Clinical Impression  On eval, pt was Min guard assist for mobility. He walked ~200 feet with a RW. Minimal pain with activity. Will plan to have a 2nd session to practice stair negotiation.        Recommendations for follow up therapy are one component of a multi-disciplinary discharge planning process, led by the attending physician.  Recommendations may be updated based on patient status, additional functional criteria and insurance authorization.  Follow Up Recommendations Follow physician's recommendations for discharge plan and follow up therapies      Assistance Recommended at Discharge PRN  Patient can return home with the following  A little help with walking and/or transfers;A little help with bathing/dressing/bathroom;Help with stairs or ramp for entrance;Assistance with cooking/housework;Assist for transportation    Equipment Recommendations Rolling walker (2 wheels)  Recommendations for Other Services       Functional Status Assessment Patient has had a recent decline in their functional status and demonstrates the ability to make significant improvements in function in a reasonable and predictable amount of time.     Precautions / Restrictions Precautions Precautions: None Restrictions Weight Bearing Restrictions: No LLE Weight Bearing: Weight bearing as tolerated      Mobility  Bed Mobility Overal bed mobility: Needs Assistance Bed Mobility: Supine to Sit           General bed mobility comments: Supv for cues.    Transfers Overall transfer level: Needs assistance Equipment used: Rolling walker (2 wheels) Transfers: Sit to/from Stand Sit to Stand: Supervision           General transfer comment: Supv for cues.     Ambulation/Gait Ambulation/Gait assistance: Supervision Gait Distance (Feet): 200 Feet Assistive device: Rolling walker (2 wheels) Gait Pattern/deviations: Step-through pattern, Decreased stride length       General Gait Details: Supv for safety. Cues for sequencing, RW proximity  Stairs            Wheelchair Mobility    Modified Rankin (Stroke Patients Only)       Balance                                             Pertinent Vitals/Pain Pain Assessment Pain Assessment: 0-10 Pain Score: 3  Pain Location: L hip Pain Descriptors / Indicators: Discomfort, Sore Pain Intervention(s): Monitored during session    Home Living Family/patient expects to be discharged to:: Private residence Living Arrangements: Spouse/significant other   Type of Home: House Home Access: Stairs to enter Entrance Stairs-Rails: None Technical brewer of Steps: 2   Home Layout: One level Home Equipment: None;Cane - single point      Prior Function Prior Level of Function : Independent/Modified Independent                     Hand Dominance        Extremity/Trunk Assessment   Upper Extremity Assessment Upper Extremity Assessment: Defer to OT evaluation    Lower Extremity Assessment Lower Extremity Assessment: Generalized weakness    Cervical / Trunk Assessment Cervical / Trunk Assessment: Normal  Communication   Communication: No difficulties  Cognition Arousal/Alertness: Awake/alert Behavior During Therapy: [facility] for  tasks assessed/performed Overall Cognitive Status: Within Functional Limits for tasks assessed                                          General Comments      Exercises Total Joint Exercises Ankle Circles/Pumps: AROM, Both, 10 reps Quad Sets: AROM, Both, 10 reps Heel Slides: AROM, Left, 10 reps Hip ABduction/ADduction: AROM, Left, 10 reps, Supine Long Arc Quad: AROM, Left, 10 reps, Seated    Assessment/Plan    PT Assessment Patient needs continued PT services  PT Problem List Decreased strength;Decreased balance;Decreased range of motion;Decreased activity tolerance;Decreased mobility;Pain;Decreased knowledge of use of DME       PT Treatment Interventions DME instruction;Gait training;Functional mobility training;Balance training;Patient/family education;Therapeutic exercise;Stair training;Therapeutic activities    PT Goals (Current goals can be found in the Care Plan section)  Acute Rehab PT Goals Patient Stated Goal: regain independence. Get back to playing golf. PT Goal Formulation: With patient Time For Goal Achievement: 05/21/22 Potential to Achieve Goals: Good    Frequency 7X/week     Co-evaluation               AM-PAC PT "6 Clicks" Mobility  Outcome Measure Help needed turning from your back to your side while in a flat bed without using bedrails?: A Little Help needed moving from lying on your back to sitting on the side of a flat bed without using bedrails?: A Little Help needed moving to and from a bed to a chair (including a wheelchair)?: A Little Help needed standing up from a chair using your arms (e.g., wheelchair or bedside chair)?: A Little Help needed to walk in hospital room?: A Little Help needed climbing 3-5 steps with a railing? : A Little 6 Click Score: 18    End of Session Equipment Utilized During Treatment: Gait belt Activity Tolerance: Patient tolerated treatment well Patient left: in chair;with call bell/phone within reach   PT Visit Diagnosis: Pain;Other abnormalities of gait and mobility (R26.89) Pain - Right/Left: Left Pain - part of body: Hip    Time: 0981-1914 PT Time Calculation (min) (ACUTE ONLY): 20 min   Charges:   PT Evaluation $PT Eval Low Complexity: [place], PT Acute Rehabilitation  Office: [phone] Pager: [phone]

## 2022-05-07 NOTE — Plan of Care (Signed)
Plan of care reviewed and discussed with the patient. 

## 2022-05-07 NOTE — TOC Transition Note (Signed)
Transition of Care [facility]) - CM/SW Discharge Note  Patient Details  Name: Peter Drake MRN: [id number] Date of Birth: 06-Nov-1945  [facility] CM/SW Contact:  Sherie Don, LCSW Phone Number: 05/07/2022, 9:41 AM  Clinical Narrative: Patient is expected to discharge home after working with PT. CSW met with patient to confirm discharge plan and needs. Patient will go home with a home exercise program (HEP). Patient will need a rolling walker, which was delivered to patient's room by [employer]. TOC signing off.  Final next level of care: Home/Self Care Barriers to Discharge: No Barriers Identified  Patient Goals and CMS Choice Patient states their goals for this hospitalization and ongoing recovery are:: Discharge home with HEP CMS Medicare.gov Compare Post Acute Care list provided to:: Patient  Discharge Plan and Services         DME Arranged: Walker rolling DME Agency: [employer] Representative spoke with at DME Agency: Prearranged in orthopedist's office  Readmission Risk Interventions     No data to display

## 2022-05-21 NOTE — Discharge Summary (Signed)
Patient ID: Peter Drake MRN: [id number] DOB/AGE: 77-Sep-1947 77 y.o.  Admit date: 05/06/2022 Discharge date: 05/07/2022  Admission Diagnoses:  Left hip osteoarthritis  Discharge Diagnoses:  Principal Problem:   S/P total left hip arthroplasty   Past Medical History:  Diagnosis Date   Allergy    pcn   Amaurosis fugax    Anxiety    Arthritis    HX: of OA   BPH (benign prostatic hypertrophy)    Hx: of   Cancer ([place]) 06/23/2008   L vocoal cord sq cell ca   Colorectal polyps 10/2001   Dyslipidemia    Early cataracts, bilateral    Hx: of   GERD (gastroesophageal reflux disease)    H/O Mohs micrographic surgery for skin cancer    Headache    has aura's associated with migraines   History of radiation therapy 08/04/08-09/20/08   larynx   Hyperlipidemia    PVC (premature ventricular contraction)    Radiation 08/04/08-09/20/08   Larynx   33 treatments   Sleep apnea    borderline no cpap needed    Surgeries: Procedure(s): TOTAL HIP ARTHROPLASTY ANTERIOR APPROACH on 05/06/2022   Consultants:   Discharged Condition: Improved  Hospital Course: Peter Drake is an 76 y.o. male who was admitted 05/06/2022 for operative treatment ofS/P total left hip arthroplasty. Patient has severe unremitting pain that affects sleep, daily activities, and work/hobbies. After pre-op clearance the patient was taken to the operating room on 05/06/2022 and underwent  Procedure(s): TOTAL HIP ARTHROPLASTY ANTERIOR APPROACH.    Patient was given perioperative antibiotics:  Anti-infectives (From admission, onward)    Start     Dose/Rate Route Frequency Ordered Stop   05/06/22 2000  ceFAZolin (ANCEF) IVPB 2g/100 mL premix        2 g 200 mL/hr over 30 Minutes Intravenous Every 6 hours 05/06/22 1658 05/07/22 0628   05/06/22 1200  ceFAZolin (ANCEF) IVPB 2g/100 mL premix        2 g 200 mL/hr over 30 Minutes Intravenous On call to O.R. 05/06/22 1155 05/06/22 1450        Patient was given  sequential compression devices, early ambulation, and chemoprophylaxis to prevent DVT. Patient worked with PT and was meeting their goals regarding safe ambulation and transfers.  Patient benefited maximally from hospital stay and there were no complications.    Recent vital signs: No data found.   Recent laboratory studies: No results for input(s): "WBC", "HGB", "HCT", "PLT", "NA", "K", "CL", "CO2", "BUN", "CREATININE", "GLUCOSE", "INR", "CALCIUM" in the last 72 hours.  Invalid input(s): "PT", "2"   Discharge Medications:   Allergies as of 05/07/2022       Reactions   Penicillins Hives   Did it involve swelling of the face/tongue/throat, SOB, or low BP? Unknown Did it involve sudden or severe rash/hives, skin peeling, or any reaction on the inside of your mouth or nose? Unknown Did you need to seek medical attention at a hospital or doctor's office? Unknown When did it last happen?      Childhood allergy If all above answers are "NO", may proceed with cephalosporin use. Patient tolerated ancef administration without adverse reaction.        Medication List     STOP taking these medications    acetaminophen 500 MG tablet Commonly known as: TYLENOL   tiZANidine 4 MG tablet Commonly known as: ZANAFLEX       TAKE these medications    aspirin 81 MG chewable tablet Chew 1  tablet (81 mg total) by mouth 2 (two) times daily for 28 days.   docusate sodium 100 MG capsule Commonly known as: COLACE Take 1 capsule (100 mg total) by mouth 2 (two) times daily.   fluticasone 50 MCG/ACT nasal spray Commonly known as: FLONASE Place 2 sprays into both nostrils at bedtime.   HYDROcodone-acetaminophen 5-325 MG tablet Commonly known as: NORCO/VICODIN Take 1 tablet by mouth every 4 (four) hours as needed for severe pain.   pantoprazole 40 MG tablet Commonly known as: PROTONIX Take 40 mg by mouth daily.   polyethylene glycol 17 g packet Commonly known as: MIRALAX / GLYCOLAX Take  17 g by mouth daily as needed for mild constipation.   simvastatin 20 MG tablet Commonly known as: ZOCOR Take 20 mg by mouth daily.   tamsulosin 0.4 MG Caps capsule Commonly known as: FLOMAX Take 0.4 mg by mouth 2 (two) times daily.   traZODone 50 MG tablet Commonly known as: DESYREL Take 50 mg by mouth at bedtime.               Discharge Care Instructions  (From admission, onward)           Start     Ordered   05/07/22 0000  Change dressing       Comments: Maintain surgical dressing until follow up in the clinic. If the edges start to pull up, may reinforce with tape. If the dressing is no longer working, may remove and cover with gauze and tape, but must keep the area dry and clean.  Call with any questions or concerns.   05/07/22 0814            Diagnostic Studies: DG Pelvis Portable  Result Date: 05/06/2022 CLINICAL DATA:  Left anterior hip replacement EXAM: PORTABLE PELVIS 1-2 VIEWS COMPARISON:  05/06/2022 FINDINGS: Frontal view of the bilateral hips was performed. Left hip arthroplasty is identified in the expected position without signs of acute complication. No acute fracture. Postsurgical changes in the soft tissues overlying the left hip. IMPRESSION: 1. Unremarkable left hip arthroplasty. Electronically Signed   By: Randa Ngo M.D.   On: 05/06/2022 16:35   DG HIP UNILAT WITH PELVIS 1V LEFT  Result Date: 05/06/2022 CLINICAL DATA:  Intraoperative imaging for left hip arthroplasty. EXAM: DG HIP (WITH OR WITHOUT PELVIS) 1V*L* COMPARISON:  CT February 14, 2022. FLUOROSCOPY: Exposure Index (as provided by the fluoroscopic device): 1.637 mGy Kerma FINDINGS: Two intraoperative spot fluoroscopic images were submitted demonstrating surgical changes of a left total hip arthroplasty without evidence of acute complication. IMPRESSION: Intraoperative imaging for left total hip arthroplasty. Electronically Signed   By: Dahlia Bailiff M.D.   On: 05/06/2022 16:23   DG C-Arm  1-60 Min-No Report  Result Date: 05/06/2022 Fluoroscopy was utilized by the requesting physician.  No radiographic interpretation.   DG C-Arm 1-60 Min-No Report  Result Date: 05/06/2022 Fluoroscopy was utilized by the requesting physician.  No radiographic interpretation.    Disposition: Discharge disposition: 01-Home or Self Care       Discharge Instructions     Call MD / Call 911   Complete by: As directed    If you experience chest pain or shortness of breath, CALL 911 and be transported to the hospital emergency room.  If you develope a fever above 101 F, pus (white drainage) or increased drainage or redness at the wound, or calf pain, call your surgeon's office.   Change dressing   Complete by: As directed  Maintain surgical dressing until follow up in the clinic. If the edges start to pull up, may reinforce with tape. If the dressing is no longer working, may remove and cover with gauze and tape, but must keep the area dry and clean.  Call with any questions or concerns.   Constipation Prevention   Complete by: As directed    Drink plenty of fluids.  Prune juice may be helpful.  You may use a stool softener, such as Colace (over the counter) 100 mg twice a day.  Use MiraLax (over the counter) for constipation as needed.   Diet - low sodium heart healthy   Complete by: As directed    Increase activity slowly as tolerated   Complete by: As directed    Weight bearing as tolerated with assist device (walker, cane, etc) as directed, use it as long as suggested by your surgeon or therapist, typically at least 4-6 weeks.   Post-operative opioid taper instructions:   Complete by: As directed    POST-OPERATIVE OPIOID TAPER INSTRUCTIONS: It is important to wean off of your opioid medication as soon as possible. If you do not need pain medication after your surgery it is ok to stop day one. Opioids include: Codeine, Hydrocodone(Norco, Vicodin), Oxycodone(Percocet, oxycontin) and  hydromorphone amongst others.  Long term and even short term use of opiods can cause: Increased pain response Dependence Constipation Depression Respiratory depression And more.  Withdrawal symptoms can include Flu like symptoms Nausea, vomiting And more Techniques to manage these symptoms Hydrate well Eat regular healthy meals Stay active Use relaxation techniques(deep breathing, meditating, yoga) Do Not substitute Alcohol to help with tapering If you have been on opioids for less than two weeks and do not have pain than it is ok to stop all together.  Plan to wean off of opioids This plan should start within one week post op of your joint replacement. Maintain the same interval or time between taking each dose and first decrease the dose.  Cut the total daily intake of opioids by one tablet each day Next start to increase the time between doses. The last dose that should be eliminated is the evening dose.      TED hose   Complete by: As directed    Use stockings (TED hose) for 2 weeks on both leg(s).  You may remove them at night for sleeping.        Follow-up Information     Paralee Cancel, MD. Schedule an appointment as soon as possible for a visit in 2 day(s).   Specialty: Orthopedic Surgery Contact information: [address] [place] [address] [phone]                  Signed: Irving Copas 05/21/2022, 12:57 PM

## 2022-07-04 ENCOUNTER — Other Ambulatory Visit: Payer: Self-pay | Admitting: Neurological Surgery

## 2022-07-04 DIAGNOSIS — M4802 Spinal stenosis, cervical region: Secondary | ICD-10-CM

## 2022-07-20 ENCOUNTER — Ambulatory Visit
Admission: RE | Admit: 2022-07-20 | Discharge: 2022-07-20 | Disposition: A | Discharge status: Home / Self Care | Payer: Medicare PPO | Source: Ambulatory Visit | Attending: Neurological Surgery | Admitting: Neurological Surgery

## 2022-07-20 DIAGNOSIS — M542 Cervicalgia: Secondary | ICD-10-CM | POA: Diagnosis not present

## 2022-07-20 DIAGNOSIS — M4802 Spinal stenosis, cervical region: Secondary | ICD-10-CM | POA: Diagnosis not present

## 2022-08-04 DIAGNOSIS — G5602 Carpal tunnel syndrome, left upper limb: Secondary | ICD-10-CM | POA: Diagnosis not present

## 2022-08-04 DIAGNOSIS — G609 Hereditary and idiopathic neuropathy, unspecified: Secondary | ICD-10-CM | POA: Diagnosis not present

## 2022-08-12 DIAGNOSIS — M47812 Spondylosis without myelopathy or radiculopathy, cervical region: Secondary | ICD-10-CM | POA: Diagnosis not present

## 2022-08-13 DIAGNOSIS — C44619 Basal cell carcinoma of skin of left upper limb, including shoulder: Secondary | ICD-10-CM | POA: Diagnosis not present

## 2022-08-13 DIAGNOSIS — L720 Epidermal cyst: Secondary | ICD-10-CM | POA: Diagnosis not present

## 2022-08-13 DIAGNOSIS — C44519 Basal cell carcinoma of skin of other part of trunk: Secondary | ICD-10-CM | POA: Diagnosis not present

## 2022-08-13 DIAGNOSIS — L570 Actinic keratosis: Secondary | ICD-10-CM | POA: Diagnosis not present

## 2022-08-13 DIAGNOSIS — Z85828 Personal history of other malignant neoplasm of skin: Secondary | ICD-10-CM | POA: Diagnosis not present

## 2022-08-13 DIAGNOSIS — D485 Neoplasm of uncertain behavior of skin: Secondary | ICD-10-CM | POA: Diagnosis not present

## 2022-08-13 DIAGNOSIS — L821 Other seborrheic keratosis: Secondary | ICD-10-CM | POA: Diagnosis not present

## 2022-08-15 ENCOUNTER — Other Ambulatory Visit: Payer: Self-pay | Admitting: Neurological Surgery

## 2022-09-05 DIAGNOSIS — K4090 Unilateral inguinal hernia, without obstruction or gangrene, not specified as recurrent: Secondary | ICD-10-CM | POA: Diagnosis not present

## 2022-09-12 NOTE — Pre-Procedure Instructions (Signed)
Surgical Instructions    Your procedure is scheduled on Wednesday, December 6th.  Report to [facility] Main Entrance "A" at 6:30 A.M., then check in with the Admitting office.  Call this number if you have problems the morning of surgery:  [phone]   If you have any questions prior to your surgery date call [phone]: Open Monday-Friday 8am-4pm    Remember:  Do not eat or drink after midnight the night before your surgery    Take these medicines the morning of surgery with A SIP OF WATER  pantoprazole (PROTONIX)  simvastatin (ZOCOR)  Tamsulosin HCl (FLOMAX)  tiZANidine (ZANAFLEX)-as needed  As of today, STOP taking any Aspirin (unless otherwise instructed by your surgeon) Aleve, Naproxen, Ibuprofen, Motrin, Advil, Goody's, BC's, all herbal medications, fish oil, and all vitamins.                     Do NOT Smoke (Tobacco/Vaping) for 24 hours prior to your procedure.  If you use a CPAP at night, you may bring your mask/headgear for your overnight stay.   Contacts, glasses, piercing's, hearing aid's, dentures or partials may not be worn into surgery, please bring cases for these belongings.    For patients admitted to the hospital, discharge time will be determined by your treatment team.   Patients discharged the day of surgery will not be allowed to drive home, and someone needs to stay with them for 24 hours.  SURGICAL WAITING ROOM VISITATION Patients having surgery or a procedure may have no more than 2 support people in the waiting area - these visitors may rotate.   Children under the age of 39 must have an adult with them who is not the patient. If the patient needs to stay at the hospital during part of their recovery, the visitor guidelines for inpatient rooms apply. Pre-op nurse will coordinate an appropriate time for 1 support person to accompany patient in pre-op.  This support person may not rotate.   Please refer to the [facility] website for the visitor  guidelines for Inpatients (after your surgery is over and you are in a regular room).    Special instructions:   [facility]- Preparing For Surgery  Before surgery, you can play an important role. Because skin is not sterile, your skin needs to be as free of germs as possible. You can reduce the number of germs on your skin by washing with CHG (chlorahexidine gluconate) Soap before surgery.  CHG is an antiseptic cleaner which kills germs and bonds with the skin to continue killing germs even after washing.    Oral Hygiene is also important to reduce your risk of infection.  Remember - BRUSH YOUR TEETH THE MORNING OF SURGERY WITH YOUR REGULAR TOOTHPASTE  Please do not use if you have an allergy to CHG or antibacterial soaps. If your skin becomes reddened/irritated stop using the CHG.  Do not shave (including legs and underarms) for at least 48 hours prior to first CHG shower. It is OK to shave your face.  Please follow these instructions carefully.   Shower the NIGHT BEFORE SURGERY and the MORNING OF SURGERY  If you chose to wash your hair, wash your hair first as usual with your normal shampoo.  After you shampoo, rinse your hair and body thoroughly to remove the shampoo.  Use CHG Soap as you would any other liquid soap. You can apply CHG directly to the skin and wash gently with a scrungie or a clean  washcloth.   Apply the CHG Soap to your body ONLY FROM THE NECK DOWN.  Do not use on open wounds or open sores. Avoid contact with your eyes, ears, mouth and genitals (private parts). Wash Face and genitals (private parts)  with your normal soap.   Wash thoroughly, paying special attention to the area where your surgery will be performed.  Thoroughly rinse your body with warm water from the neck down.  DO NOT shower/wash with your normal soap after using and rinsing off the CHG Soap.  Pat yourself dry with a CLEAN TOWEL.  Wear CLEAN PAJAMAS to bed the night before surgery  Place CLEAN  SHEETS on your bed the night before your surgery  DO NOT SLEEP WITH PETS.   Day of Surgery: Take a shower with CHG soap. Do not wear jewelry Do not wear lotions, powders, colognes, or deodorant. Men may shave face and neck. Do not bring valuables to the hospital.  [facility] is not responsible for any belongings or valuables. Wear Clean/Comfortable clothing the morning of surgery Remember to brush your teeth WITH YOUR REGULAR TOOTHPASTE.   Please read over the following fact sheets that you were given.    If you received a COVID test during your pre-op visit  it is requested that you wear a mask when out in public, stay away from anyone that may not be feeling well and notify your surgeon if you develop symptoms. If you have been in contact with anyone that has tested positive in the last 10 days please notify you surgeon.

## 2022-09-15 ENCOUNTER — Other Ambulatory Visit: Payer: Self-pay

## 2022-09-15 ENCOUNTER — Encounter (HOSPITAL_COMMUNITY): Payer: Self-pay

## 2022-09-15 ENCOUNTER — Encounter (HOSPITAL_COMMUNITY)
Admission: RE | Admit: 2022-09-15 | Discharge: 2022-09-15 | Disposition: A | Discharge status: Home / Self Care | Payer: Medicare PPO | Source: Ambulatory Visit | Attending: Neurological Surgery | Admitting: Neurological Surgery

## 2022-09-15 VITALS — BP 148/85 | HR 55 | Temp 97.7°F | Resp 18 | Ht 69.0 in | Wt 159.3 lb

## 2022-09-15 DIAGNOSIS — Z789 Other specified health status: Secondary | ICD-10-CM | POA: Insufficient documentation

## 2022-09-15 DIAGNOSIS — Z01818 Encounter for other preprocedural examination: Secondary | ICD-10-CM | POA: Insufficient documentation

## 2022-09-15 LAB — COMPREHENSIVE METABOLIC PANEL
ALT: 18 U/L (ref 0–44)
AST: 24 U/L (ref 15–41)
Albumin: 4.1 g/dL (ref 3.5–5.0)
Alkaline Phosphatase: 69 U/L (ref 38–126)
Anion gap: 7 (ref 5–15)
BUN: 6 mg/dL — ABNORMAL LOW (ref 8–23)
CO2: 27 mmol/L (ref 22–32)
Calcium: 9.1 mg/dL (ref 8.9–10.3)
Chloride: 100 mmol/L (ref 98–111)
Creatinine, Ser: 0.73 mg/dL (ref 0.61–1.24)
GFR, Estimated: >60 mL/min (ref >60)
Glucose, Bld: 89 mg/dL (ref 70–99)
Potassium: 4.2 mmol/L (ref 3.5–5.1)
Sodium: 134 mmol/L — ABNORMAL LOW (ref 135–145)
Total Bilirubin: 0.9 mg/dL (ref 0.3–1.2)
Total Protein: 6.5 g/dL (ref 6.5–8.1)

## 2022-09-15 LAB — TYPE AND SCREEN
ABO/RH(D): O NEG
Antibody Screen: NEGATIVE
Weak D: POSITIVE

## 2022-09-15 LAB — CBC
HCT: 45.2 % (ref 39.0–52.0)
Hemoglobin: 15.5 g/dL (ref 13.0–17.0)
MCH: 31.6 pg (ref 26.0–34.0)
MCHC: 34.3 g/dL (ref 30.0–36.0)
MCV: 92.1 fL (ref 80.0–100.0)
Platelets: 182 10*3/uL (ref 150–400)
RBC: 4.91 MIL/uL (ref 4.22–5.81)
RDW: 12.7 % (ref 11.5–15.5)
WBC: 5.1 10*3/uL (ref 4.0–10.5)
nRBC: 0 % (ref 0.0–0.2)

## 2022-09-15 LAB — SURGICAL PCR SCREEN
MRSA, PCR: NEGATIVE
Staphylococcus aureus: NEGATIVE

## 2022-09-15 LAB — PROTIME-INR
INR: 1.1 (ref 0.8–1.2)
Prothrombin Time: 14 seconds (ref 11.4–15.2)

## 2022-09-15 NOTE — Progress Notes (Signed)
PCP - Dr. Gaynelle Arabian Cardiologist - denies  PPM/ICD - n/a  Chest x-ray - n/a EKG - 09/15/22 Stress Test - denies ECHO - 12/19/19 Cardiac Cath -denies   Sleep Study - denies CPAP - denies  Last dose of GLP1 agonist-  n/a GLP1 instructions: n/a  Blood Thinner Instructions: n/a Aspirin Instructions: n/a  NPO  COVID TEST- n/a  Anesthesia review: Yes, abnormal EKG.  Patient denies shortness of breath, fever, cough and chest pain at PAT appointment   All instructions explained to the patient, with a verbal understanding of the material. Patient agrees to go over the instructions while at home for a better understanding. Patient also instructed to self quarantine after being tested for COVID-19. The opportunity to ask questions was provided.  Pt states that he was recently diagnosed with an left inguinal hernia that is progressing and will need repair. Pt states that surgery for the hernia may need to happen prior to spine surgery. Pt is waiting to hear back from his surgical referral.

## 2022-09-16 ENCOUNTER — Encounter (HOSPITAL_COMMUNITY): Payer: Self-pay

## 2022-09-16 NOTE — Progress Notes (Signed)
Anesthesia Chart Review:  Case: [id number] Date/Time: 09/24/22 0815   Procedure: ACDF - C3-C4 - C4-C5 - C5-C6   Anesthesia type: General   Pre-op diagnosis: Spondylosis   Location: [facility] OR ROOM 20 / [facility]   Surgeons: Eustace Moore, MD       DISCUSSION: Patient is a 76 year old male scheduled for the above procedure.  History includes former smoker (quit 10/20/78), dyslipidemia, PVCs (> 20 years ago, "benign"), OSA ("borderline", no CPAP), GERD, BPH, left vocal cord cancer (s/p 33 radiation tx, 08/04/08-09/20/08), spinal surgery (C6-7 ACDF 06/14/09), osteoarthritis (right TSA 11/02/13, left TSA 10/04/20; left THA 05/06/22), hernia (left IHR 04/01/05).  EKG 09/15/22 showed NSR, LAD, inferior infarct (age undetermined). Loss of r wave in aVF when compared to 06/14/09 EKG and 02/08/13 EKG (from PCP, scanned under Media tab), although somewhat chronic low voltage in inferior leads on EKG tracings. There are not ST/T wave changes or PVCs. I called and spoke with Peter Drake. He denied chest pain, SOB, edema, palpitations. He is overall active, even playing golf regularly until a few weeks ago. Historically he has been able to climb stairs and do yard work, but he has been recovering from his left THA in July. In regards to PVCs history, he reported that over 20 years ago he was having skipped beats and was referred to cardiology and had a Holter monitor that reportedly showed only occasional PVCs. Findings felt benign, and no specific treatment recommended. He has not had any recent issues with irregular heart rhythm or palpitations. No edema. He did report burning in his right groin more with activity and thinks he has a right inguinal hernia. He has been referred to general surgery, but does not have an appointment yet. He does not see any significant bulge and has not had to reduce his suspected hernia. He has contacted Dr. Ronnald Ramp' office to talk about coordinating timing between surgeries should he require IHR in the  future.  He had radiation for left vocal cord cancer around 2009. He denied limitation of mouth opening. He does have mild hoarseness. Reports dental crowns.   Anesthesia team to evaluate on the day of surgery. Reviewed with anesthesiologist Renold Don, MD.    VS: BP (!) 148/85   Pulse (!) 55   Temp 36.5 C   Resp 18   Ht '5\' 9"'$  (1.753 m)   Wt 72.3 kg   SpO2 100%   BMI 23.52 kg/m    PROVIDERS: Gaynelle Arabian, MD is PCP  Jerrell Belfast, MD is ENT Juanita Craver, MD is GI Luberta Mutter, MD is ophthalmologist   LABS: Labs reviewed: Acceptable for surgery. (all labs ordered are listed, but only abnormal results are displayed)  Labs Reviewed  COMPREHENSIVE METABOLIC PANEL - Abnormal; Notable for the following components:      Result Value   Sodium 134 (*)    BUN 6 (*)    All other components within normal limits  SURGICAL PCR SCREEN  PROTIME-INR  CBC  TYPE AND SCREEN     IMAGES: MRI C-spine 07/20/22: IMPRESSION: 1. Status post ACDF at C6-C7 without residual spinal canal or neural foraminal stenosis. 2. Progressed degenerative changes elsewhere in the cervical spine as above resulting in severe spinal canal stenosis and severe left worse than right neural foraminal stenosisat C3-C4, and moderate spinal canal stenosis and severe bilateral neural foraminal stenosis at C4-C5. No definite cord signal abnormality. 3. Moderate to severe left and mild-to-moderate right neural foraminal stenosis at C5-C6. 4. Facet  arthropathy with mild perifacetal soft tissue edema on the left at C3-C4 which may reflect a source of pain.   EKG: 09/15/22:  Normal sinus rhythm Left axis deviation Inferior infarct , age undetermined Abnormal ECG When compared with ECG of 14-Jun-2009 08:07, PREVIOUS ECG IS PRESENT Cannot rule out Inferior infarct New since previous tracing Confirmed by Kirk Ruths [phone]) on 09/15/2022 10:59:42 AM   CV: Echo 12/19/19 (ordered by Dr. Ellie Lunch  for transient left eye visual change, denied recurrent) : IMPRESSIONS   1. Left ventricular ejection fraction, by estimation, is 60 to 65%. The  left ventricle has normal function. The left ventricle has no regional  wall motion abnormalities. Left ventricular diastolic parameters are  indeterminate. The average left  ventricular global longitudinal strain is -19.7 %.   2. Right ventricular systolic function is normal. The right ventricular  size is normal. There is normal pulmonary artery systolic pressure.   3. The mitral valve is normal in structure and function. No evidence of  mitral valve regurgitation. No evidence of mitral stenosis.   4. The aortic valve is normal in structure and function. Aortic valve  regurgitation is not visualized. No aortic stenosis is present.   5. Aortic dilatation noted. 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    US Carotid 12/19/19: Summary:  - Right Carotid: There is no evidence of stenosis in the right ICA.  The extracranial vessels were near-normal with only minimal  wall thickening or plaque.  - Left Carotid: There is no evidence of stenosis in the left ICA.  The extracranial vessels were near-normal with only minimal  wall thickening or plaque.  - Vertebrals:  Bilateral vertebral arteries demonstrate antegrade flow.  - Subclavians: Normal flow hemodynamics were seen in bilateral subclavian arteries.    Past Medical History:  Diagnosis Date   Allergy    pcn   Amaurosis fugax    Anxiety    Arthritis    HX: of OA   BPH (benign prostatic hypertrophy)    Hx: of   Cancer ([place]) 06/23/2008   L vocoal cord sq cell ca   Colorectal polyps 10/2001   Dyslipidemia    Early cataracts, bilateral    Hx: of   GERD (gastroesophageal reflux disease)    H/O Mohs micrographic surgery for skin cancer    Headache    has aura's associated with migraines   History of radiation therapy 08/04/08-09/20/08   larynx    Hyperlipidemia    PVC (premature ventricular contraction)    Radiation 08/04/08-09/20/08   Larynx   33 treatments   Sleep apnea    borderline no cpap needed    Past Surgical History:  Procedure Laterality Date   basal cell excisions     by dr. hall   CERVICAL SPINE SURGERY     COLONOSCOPY W/ BIOPSIES AND POLYPECTOMY     Hx: of   COLONOSCOPY WITH PROPOFOL N/A 12/23/2018   Procedure: COLONOSCOPY WITH PROPOFOL;  Surgeon: Juanita Craver, MD;  Location: [facility];  Service: Endoscopy;  Laterality: N/A;   ESOPHAGOGASTRODUODENOSCOPY (EGD) WITH PROPOFOL N/A 12/23/2018   Procedure: ESOPHAGOGASTRODUODENOSCOPY (EGD) WITH PROPOFOL;  Surgeon: Juanita Craver, MD;  Location: [facility];  Service: Endoscopy;  Laterality: N/A;   EYE SURGERY     bil with implant   FINGER SURGERY     right fifth   INGUINAL HERNIA REPAIR   03/2005   left    left knee arthoscopy  x 2   MOHS SURGERY     x3   NASAL SINUS SURGERY  1990's   polyp removal     squamous cell  33 radiation treatments vocal cord   POLYPECTOMY  12/23/2018   Procedure: POLYPECTOMY;  Surgeon: Juanita Craver, MD;  Location: [facility];  Service: Endoscopy;;   RADIAL KERATOTOMY     20 years ago/ bi lateral   TONSILLECTOMY     as a child   TOTAL HIP ARTHROPLASTY Left 05/06/2022   Procedure: TOTAL HIP ARTHROPLASTY ANTERIOR APPROACH;  Surgeon: Paralee Cancel, MD;  Location: [facility];  Service: Orthopedics;  Laterality: Left;   TOTAL SHOULDER ARTHROPLASTY Right 11/03/2013   DR SUPPLE   TOTAL SHOULDER ARTHROPLASTY Right 11/02/2013   Procedure: RIGHT TOTAL SHOULDER ARTHROPLASTY;  Surgeon: Marin Shutter, MD;  Location: [facility];  Service: Orthopedics;  Laterality: Right;   TOTAL SHOULDER ARTHROPLASTY Left 10/04/2020   Procedure: TOTAL SHOULDER ARTHROPLASTY;  Surgeon: Justice Britain, MD;  Location: [facility];  Service: Orthopedics;  Laterality: Left;  181mn    MEDICATIONS:  docusate sodium (COLACE) 100 MG capsule   fluticasone (FLONASE) 50 MCG/ACT nasal  spray   HYDROcodone-acetaminophen (NORCO/VICODIN) 5-325 MG tablet   pantoprazole (PROTONIX) 40 MG tablet   polyethylene glycol (MIRALAX / GLYCOLAX) 17 g packet   simvastatin (ZOCOR) 20 MG tablet   Tamsulosin HCl (FLOMAX) 0.4 MG CAPS   tiZANidine (ZANAFLEX) 4 MG tablet   traZODone (DESYREL) 50 MG tablet   No current facility-administered medications for this encounter.    AMyra Gianotti PA-C Surgical Short Stay/Anesthesiology MNaugatuck Valley Endoscopy Center LLCPhone [phone] Endoscopy CenterPhone [phone]/28/2023 6:24 PM

## 2022-09-16 NOTE — Anesthesia Preprocedure Evaluation (Addendum)
Anesthesia Evaluation  Patient identified by MRN, date of birth, ID band Patient awake    Reviewed: Allergy & Precautions, H&P , NPO status , Patient's Chart, lab work & pertinent test results  Airway Mallampati: II  TM Distance: >3 FB Neck ROM: Full    Dental no notable dental hx. (+) Teeth Intact, Dental Advisory Given   Pulmonary neg pulmonary ROS, former smoker   Pulmonary exam normal breath sounds clear to auscultation       Cardiovascular negative cardio ROS  Rhythm:Regular Rate:Normal  Echo 12/19/19: IMPRESSIONS  1. Left ventricular ejection fraction, by estimation, is 60 to 65%. The  left ventricle has normal function. The left ventricle has no regional  wall motion abnormalities. Left ventricular diastolic parameters are  indeterminate. The average left  ventricular global longitudinal strain is -19.7 %.  2. Right ventricular systolic function is normal. The right ventricular  size is normal. There is normal pulmonary artery systolic pressure.  3. The mitral valve is normal in structure and function. No evidence of  mitral valve regurgitation. No evidence of mitral stenosis.  4. The aortic valve is normal in structure and function. Aortic valve  regurgitation is not visualized. No aortic stenosis is present.  5. Aortic dilatation noted. 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     Neuro/Psych  Headaches  Anxiety        GI/Hepatic Neg liver ROS,GERD  Medicated,,  Endo/Other  negative endocrine ROS    Renal/GU negative Renal ROS  negative genitourinary   Musculoskeletal  (+) Arthritis , Osteoarthritis,    Abdominal   Peds  Hematology negative hematology ROS (+)   Anesthesia Other Findings   Reproductive/Obstetrics negative OB ROS                             Anesthesia Physical Anesthesia Plan  ASA: 2  Anesthesia Plan: General    Post-op Pain Management: Tylenol PO (pre-op)* and Ketamine IV*   Induction: Intravenous  PONV Risk Score and Plan: 4 or greater and Ondansetron, Dexamethasone, Diphenhydramine and Treatment may vary due to age or medical condition  Airway Management Planned: Oral ETT  Additional Equipment:   Intra-op Plan:   Post-operative Plan: Extubation in OR  Informed Consent: I have reviewed the patients History and Physical, chart, labs and discussed the procedure including the risks, benefits and alternatives for the proposed anesthesia with the patient or authorized representative who has indicated his/her understanding and acceptance.     Dental advisory given  Plan Discussed with: Anesthesiologist and CRNA  Anesthesia Plan Comments: (PAT note written 09/16/2022 by Myra Gianotti, PA-C.  )       Anesthesia Quick Evaluation

## 2022-09-19 DIAGNOSIS — R9431 Abnormal electrocardiogram [ECG] [EKG]: Secondary | ICD-10-CM | POA: Diagnosis not present

## 2022-09-22 ENCOUNTER — Ambulatory Visit: Payer: Medicare PPO | Attending: Cardiovascular Disease | Admitting: Cardiovascular Disease

## 2022-09-22 ENCOUNTER — Encounter: Payer: Self-pay | Admitting: Cardiovascular Disease

## 2022-09-22 VITALS — BP 150/84 | HR 73 | Ht 69.0 in | Wt 158.6 lb

## 2022-09-22 DIAGNOSIS — M4802 Spinal stenosis, cervical region: Secondary | ICD-10-CM | POA: Diagnosis not present

## 2022-09-22 DIAGNOSIS — R9431 Abnormal electrocardiogram [ECG] [EKG]: Secondary | ICD-10-CM | POA: Diagnosis not present

## 2022-09-22 DIAGNOSIS — Z01810 Encounter for preprocedural cardiovascular examination: Secondary | ICD-10-CM

## 2022-09-22 NOTE — Progress Notes (Signed)
Cardiology Office Note:    Date:  09/22/2022   ID:  Peter Drake, DOB Mar 05, 1946, MRN [id number]  PCP:  Gaynelle Arabian, [place] Providers Cardiologist:  Sherren Mocha, MD     Referring MD: Gaynelle Arabian, MD   Chief Complaint  Patient presents with   Pre-op Exam    History of Present Illness:    Peter Drake is a 76 y.o. male referred for preoperative cardiology evaluation for abnormal EKG.   He requires ACDF later this week for treatment of severe cervical spine stenosis with associated left arm numbness and tingling, most bothersome at night. He has been active over the years with no cardiac symptoms. He hasn't done as much over the past few months because he had hip replacement surgery this summer. He has also developed an inguinal hernia. The patient's preop EKG raised the question of an old inferior MI and this wasn't seen on his most recent EKG which was performed about a decade ago. He has never had any symptoms of an MI. Today, he denies symptoms of palpitations, chest pain, shortness of breath, orthopnea, PND, lower extremity edema, dizziness, or syncope. He walked up 2 flights of stairs to the office and had no symptoms with that.   The patient was a smoker but quit in 1980. He has no family hx of CAD, stenting, or CABG.   Past Medical History:  Diagnosis Date   Allergy    pcn   Amaurosis fugax    Anxiety    Arthritis    HX: of OA   BPH (benign prostatic hypertrophy)    Hx: of   Cancer ([place]) 06/23/2008   L vocoal cord sq cell ca   Colorectal polyps 10/2001   Dyslipidemia    Early cataracts, bilateral    Hx: of   GERD (gastroesophageal reflux disease)    H/O Mohs micrographic surgery for skin cancer    Headache    has aura's associated with migraines   History of radiation therapy 08/04/08-09/20/08   larynx   Hyperlipidemia    PVC (premature ventricular contraction)    > 20 years, occasional PVCs on monitor, no specific treatment  recommended by cardiology then (as of 09/16/22)ported   Radiation 08/04/08-09/20/08   Larynx   33 treatments   Sleep apnea    borderline no cpap needed    Past Surgical History:  Procedure Laterality Date   basal cell excisions     by dr. hall   CERVICAL SPINE SURGERY     COLONOSCOPY W/ BIOPSIES AND POLYPECTOMY     Hx: of   COLONOSCOPY WITH PROPOFOL N/A 12/23/2018   Procedure: COLONOSCOPY WITH PROPOFOL;  Surgeon: Juanita Craver, MD;  Location: [facility];  Service: Endoscopy;  Laterality: N/A;   ESOPHAGOGASTRODUODENOSCOPY (EGD) WITH PROPOFOL N/A 12/23/2018   Procedure: ESOPHAGOGASTRODUODENOSCOPY (EGD) WITH PROPOFOL;  Surgeon: Juanita Craver, MD;  Location: [facility];  Service: Endoscopy;  Laterality: N/A;   EYE SURGERY     bil with implant   FINGER SURGERY     right fifth   INGUINAL HERNIA REPAIR   03/2005   left    left knee arthoscopy     x 2   MOHS SURGERY     x3   NASAL SINUS SURGERY  1990's   polyp removal     squamous cell  33 radiation treatments vocal cord   POLYPECTOMY  12/23/2018   Procedure: POLYPECTOMY;  Surgeon: Juanita Craver, MD;  Location: [facility];  Service: Endoscopy;;   RADIAL KERATOTOMY     20 years ago/ bi lateral   TONSILLECTOMY     as a child   TOTAL HIP ARTHROPLASTY Left 05/06/2022   Procedure: TOTAL HIP ARTHROPLASTY ANTERIOR APPROACH;  Surgeon: Paralee Cancel, MD;  Location: [facility];  Service: Orthopedics;  Laterality: Left;   TOTAL SHOULDER ARTHROPLASTY Right 11/03/2013   DR SUPPLE   TOTAL SHOULDER ARTHROPLASTY Right 11/02/2013   Procedure: RIGHT TOTAL SHOULDER ARTHROPLASTY;  Surgeon: Marin Shutter, MD;  Location: [place];  Service: Orthopedics;  Laterality: Right;   TOTAL SHOULDER ARTHROPLASTY Left 10/04/2020   Procedure: TOTAL SHOULDER ARTHROPLASTY;  Surgeon: Justice Britain, MD;  Location: [facility];  Service: Orthopedics;  Laterality: Left;  175mn    Current Medications: Current Meds  Medication Sig   docusate sodium (COLACE) 100 MG capsule Take 1  capsule (100 mg total) by mouth 2 (two) times daily. (Patient taking differently: Take 100 mg by mouth daily as needed for mild constipation or moderate constipation.)   fluticasone (FLONASE) 50 MCG/ACT nasal spray Place 2 sprays into both nostrils at bedtime.   pantoprazole (PROTONIX) 40 MG tablet Take 40 mg by mouth daily.   simvastatin (ZOCOR) 20 MG tablet Take 20 mg by mouth daily.   Tamsulosin HCl (FLOMAX) 0.4 MG CAPS Take 0.4 mg by mouth 2 (two) times daily.    tiZANidine (ZANAFLEX) 4 MG tablet Take 4 mg by mouth 3 (three) times daily as needed (Pain).   traZODone (DESYREL) 50 MG tablet Take 50-100 mg by mouth at bedtime.     Allergies:   Penicillins   Social History   Socioeconomic History   Marital status: Married    Spouse name: Not on file   Number of children: Not on file   Years of education: Not on file   Highest education level: Not on file  Occupational History   Occupation: FIRE CHIEF    Employer: RETIRED   Occupation: emergency medical service staff     Comment: part time  Tobacco Use   Smoking status: Former    Packs/day: 1.00    Years: 15.00    Total pack years: 15.00    Types: Cigarettes    Quit date: 1980    Years since quitting: 43.9   Smokeless tobacco: Former   Tobacco comments:    quit smoking 1980,then smokless tobaco until 1996  Vaping Use   Vaping Use: Never used  Substance and Sexual Activity   Alcohol use: Yes    Alcohol/week: 20.0 - 24.0 standard drinks of alcohol    Types: 20 - 24 Cans of beer per week    Comment: moderate  beer, over 40 years   Drug use: No    Comment:  smokeless 20 years after cigarettees for 164y   Sexual activity: Not Currently    Birth control/protection: None  Other Topics Concern   Not on file  Social History Narrative   Not on file   Social Determinants of Health   Financial Resource Strain: Not on file  Food Insecurity: Not on file  Transportation Needs: Not on file  Physical Activity: Not on file   Stress: Not on file  Social Connections: Not on file     Family History: The patient's family history includes COPD in his father; Cancer in his sister; Heart disease in his mother; Melanoma (age of onset: 381 in his daughter; Rectal cancer in his sister.  ROS:   Please see the history of present  illness.    All other systems reviewed and are negative.  EKGs/Labs/Other Studies Reviewed:    The following studies were reviewed today: 2D echocardiogram 12/19/2019:  1. Left ventricular ejection fraction, by estimation, is 60 to 65%. The  left ventricle has normal function. The left ventricle has no regional  wall motion abnormalities. Left ventricular diastolic parameters are  indeterminate. The average left  ventricular global longitudinal strain is -19.7 %.   2. Right ventricular systolic function is normal. The right ventricular  size is normal. There is normal pulmonary artery systolic pressure.   3. The mitral valve is normal in structure and function. No evidence of  mitral valve regurgitation. No evidence of mitral stenosis.   4. The aortic valve is normal in structure and function. Aortic valve  regurgitation is not visualized. No aortic stenosis is present.   5. Aortic dilatation noted. 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   EKG:  EKG is not ordered today.  The EKG from 09/15/2022 is personally reviewed and demonstrates normal sinus rhythm 62 bpm, possible age-indeterminate inferior infarct, otherwise normal  Recent Labs: 09/15/2022: ALT 18; BUN 6; Creatinine, Ser 0.73; Hemoglobin 15.5; Platelets 182; Potassium 4.2; Sodium 134  Recent Lipid Panel No results found for: "CHOL", "TRIG", "HDL", "CHOLHDL", "VLDL", "LDLCALC", "LDLDIRECT"   Risk Assessment/Calculations:      HYPERTENSION CONTROL Vitals:   09/22/22 1358 09/22/22 2316  BP: (!) 150/88 (!) 150/84    The patient's blood pressure is elevated above target today.  In order  to address the patient's elevated BP: The blood pressure is usually elevated in clinic.  Blood pressures monitored at home have been optimal.            Physical Exam:    VS:  BP (!) 150/84   Pulse 73   Ht '5\' 9"'$  (1.753 m)   Wt 158 lb 9.6 oz (71.9 kg)   SpO2 97%   BMI 23.42 kg/m     Wt Readings from Last 3 Encounters:  09/22/22 158 lb 9.6 oz (71.9 kg)  09/15/22 159 lb 4.8 oz (72.3 kg)  05/06/22 156 lb (70.8 kg)     GEN:  Well nourished, well developed in no acute distress HEENT: Normal NECK: No JVD; No carotid bruits LYMPHATICS: No lymphadenopathy CARDIAC: RRR, no murmurs, rubs, gallops RESPIRATORY:  Clear to auscultation without rales, wheezing or rhonchi  ABDOMEN: Soft, non-tender, non-distended MUSCULOSKELETAL:  No edema; No deformity  SKIN: Warm and dry NEUROLOGIC:  Alert and oriented x 3 PSYCHIATRIC:  Normal affect   ASSESSMENT:    1. Nonspecific abnormal electrocardiogram (ECG) (EKG)   2. Preop cardiovascular exam    PLAN:    In order of problems listed above:  Reviewed pre-op EKG and today's EKG tracing. There is a question of age-indeterminate inferior MI on his preop tracing, but there are small R waves seen on today's tracing. The patient has no clinical hx of MI, angina, or heart failure symptoms. His exam is within normal limits. I think it is reasonable to repeat a 2D echo to assess for any regional wall motion abnormalities. His echo from 2021 is reviewed and showed normal LV function with no significant abnormalities. The echo can be scheduled electively and doesn't need to be completed prior to surgery. The patient has very good functional capacity and no exertional symptoms. He is at low cardiac risk of surgery. Ok to proceed without further testing.   Dispo: order 2D  echo, continue current medical therapy. LDL cholesterol at goal on simvastatin. Elevated BP today is situational as he is under stress with concerns about cardiac evaluation prior to  surgery scheduled in 48 hours. He is reassured in this regard. I will plan to see him back as needed.            Medication Adjustments/Labs and Tests Ordered: Current medicines are reviewed at length with the patient today.  Concerns regarding medicines are outlined above.  Orders Placed This Encounter  Procedures   EKG 12-Lead   ECHOCARDIOGRAM COMPLETE   No orders of the defined types were placed in this encounter.   Patient Instructions  Medication Instructions:  Your physician recommends that you continue on your current medications as directed. Please refer to the Current Medication list given to you today.  *If you need a refill on your cardiac medications before your next appointment, please call your pharmacy*  Lab Work: If you have labs (blood work) drawn today and your tests are completely normal, you will receive your results only by: [place] (if you have MyChart) OR A paper copy in the mail If you have any lab test that is abnormal or we need to change your treatment, we will call you to review the results.  Testing/Procedures: Your physician has requested that you have an echocardiogram. Echocardiography is a painless test that uses sound waves to create images of your heart. It provides your doctor with information about the size and shape of your heart and how well your heart's chambers and valves are working. This procedure takes approximately one hour. There are no restrictions for this procedure. Please do NOT wear cologne, perfume, aftershave, or lotions (deodorant is allowed). Please arrive 15 minutes prior to your appointment time.  Follow-Up: At [facility], you and your health needs are our priority.  As part of our continuing mission to provide you with exceptional heart care, we have created designated Provider Care Teams.  These Care Teams include your primary Cardiologist (physician) and Advanced Practice Providers (APPs -  Physician  Assistants and Nurse Practitioners) who all work together to provide you with the care you need, when you need it.  We recommend signing up for the patient portal called "MyChart".  Sign up information is provided on this After Visit Summary.  MyChart is used to connect with patients for Virtual Visits (Telemedicine).  Patients are able to view lab/test results, encounter notes, upcoming appointments, etc.  Non-urgent messages can be sent to your provider as well.   To learn more about what you can do with MyChart, go to [email or web address].    Your next appointment:   As needed  The format for your next appointment:   In Person  Provider:   Sherren Mocha, MD      Important Information About Sugar         Signed, Sherren Mocha, MD  09/22/2022 11:21 PM    [place]

## 2022-09-22 NOTE — Patient Instructions (Signed)
Medication Instructions:  Your physician recommends that you continue on your current medications as directed. Please refer to the Current Medication list given to you today.  *If you need a refill on your cardiac medications before your next appointment, please call your pharmacy*  Lab Work: If you have labs (blood work) drawn today and your tests are completely normal, you will receive your results only by: [place] (if you have MyChart) OR A paper copy in the mail If you have any lab test that is abnormal or we need to change your treatment, we will call you to review the results.  Testing/Procedures: Your physician has requested that you have an echocardiogram. Echocardiography is a painless test that uses sound waves to create images of your heart. It provides your doctor with information about the size and shape of your heart and how well your heart's chambers and valves are working. This procedure takes approximately one hour. There are no restrictions for this procedure. Please do NOT wear cologne, perfume, aftershave, or lotions (deodorant is allowed). Please arrive 15 minutes prior to your appointment time.  Follow-Up: At [facility], you and your health needs are our priority.  As part of our continuing mission to provide you with exceptional heart care, we have created designated Provider Care Teams.  These Care Teams include your primary Cardiologist (physician) and Advanced Practice Providers (APPs -  Physician Assistants and Nurse Practitioners) who all work together to provide you with the care you need, when you need it.  We recommend signing up for the patient portal called "MyChart".  Sign up information is provided on this After Visit Summary.  MyChart is used to connect with patients for Virtual Visits (Telemedicine).  Patients are able to view lab/test results, encounter notes, upcoming appointments, etc.  Non-urgent messages can be sent to your provider as  well.   To learn more about what you can do with MyChart, go to [email or web address].    Your next appointment:   As needed  The format for your next appointment:   In Person  Provider:   Sherren Mocha, MD      Important Information About Sugar

## 2022-09-23 ENCOUNTER — Ambulatory Visit: Payer: Self-pay | Admitting: Internal Medicine

## 2022-09-24 ENCOUNTER — Inpatient Hospital Stay (HOSPITAL_COMMUNITY): Payer: Medicare PPO | Admitting: Vascular Surgery

## 2022-09-24 ENCOUNTER — Inpatient Hospital Stay (HOSPITAL_COMMUNITY): Payer: Medicare PPO | Admitting: Certified Registered"

## 2022-09-24 ENCOUNTER — Inpatient Hospital Stay (HOSPITAL_COMMUNITY): Payer: Medicare PPO

## 2022-09-24 ENCOUNTER — Inpatient Hospital Stay (HOSPITAL_COMMUNITY)
Admission: RE | Admit: 2022-09-24 | Discharge: 2022-09-25 | DRG: 473 | Disposition: A | Discharge status: Home / Self Care | Payer: Medicare PPO | Attending: Neurological Surgery | Admitting: Neurological Surgery

## 2022-09-24 ENCOUNTER — Inpatient Hospital Stay (HOSPITAL_COMMUNITY)
Admission: RE | Disposition: A | Discharge status: Home / Self Care | Payer: Self-pay | Source: Home / Self Care | Attending: Neurological Surgery

## 2022-09-24 ENCOUNTER — Other Ambulatory Visit: Payer: Self-pay

## 2022-09-24 ENCOUNTER — Encounter (HOSPITAL_COMMUNITY): Payer: Self-pay | Admitting: Neurological Surgery

## 2022-09-24 DIAGNOSIS — E785 Hyperlipidemia, unspecified: Secondary | ICD-10-CM | POA: Diagnosis not present

## 2022-09-24 DIAGNOSIS — Z825 Family history of asthma and other chronic lower respiratory diseases: Secondary | ICD-10-CM | POA: Diagnosis not present

## 2022-09-24 DIAGNOSIS — Z923 Personal history of irradiation: Secondary | ICD-10-CM | POA: Diagnosis not present

## 2022-09-24 DIAGNOSIS — Z800 Family history of malignant neoplasm of digestive organs: Secondary | ICD-10-CM

## 2022-09-24 DIAGNOSIS — Z96611 Presence of right artificial shoulder joint: Secondary | ICD-10-CM | POA: Diagnosis present

## 2022-09-24 DIAGNOSIS — M4802 Spinal stenosis, cervical region: Secondary | ICD-10-CM

## 2022-09-24 DIAGNOSIS — Z8249 Family history of ischemic heart disease and other diseases of the circulatory system: Secondary | ICD-10-CM | POA: Diagnosis not present

## 2022-09-24 DIAGNOSIS — Z981 Arthrodesis status: Secondary | ICD-10-CM | POA: Diagnosis not present

## 2022-09-24 DIAGNOSIS — Z96642 Presence of left artificial hip joint: Secondary | ICD-10-CM | POA: Diagnosis present

## 2022-09-24 DIAGNOSIS — Z808 Family history of malignant neoplasm of other organs or systems: Secondary | ICD-10-CM | POA: Diagnosis not present

## 2022-09-24 DIAGNOSIS — M47812 Spondylosis without myelopathy or radiculopathy, cervical region: Secondary | ICD-10-CM

## 2022-09-24 DIAGNOSIS — Z87891 Personal history of nicotine dependence: Secondary | ICD-10-CM | POA: Diagnosis not present

## 2022-09-24 DIAGNOSIS — Z880 Allergy status to penicillin: Secondary | ICD-10-CM

## 2022-09-24 DIAGNOSIS — M4722 Other spondylosis with radiculopathy, cervical region: Principal | ICD-10-CM | POA: Diagnosis present

## 2022-09-24 DIAGNOSIS — M50122 Cervical disc disorder at C5-C6 level with radiculopathy: Secondary | ICD-10-CM | POA: Diagnosis present

## 2022-09-24 DIAGNOSIS — K219 Gastro-esophageal reflux disease without esophagitis: Secondary | ICD-10-CM | POA: Diagnosis present

## 2022-09-24 DIAGNOSIS — Z85828 Personal history of other malignant neoplasm of skin: Secondary | ICD-10-CM

## 2022-09-24 DIAGNOSIS — Z8521 Personal history of malignant neoplasm of larynx: Secondary | ICD-10-CM | POA: Diagnosis not present

## 2022-09-24 DIAGNOSIS — M542 Cervicalgia: Secondary | ICD-10-CM | POA: Diagnosis present

## 2022-09-24 DIAGNOSIS — N400 Enlarged prostate without lower urinary tract symptoms: Secondary | ICD-10-CM | POA: Diagnosis not present

## 2022-09-24 DIAGNOSIS — M50121 Cervical disc disorder at C4-C5 level with radiculopathy: Secondary | ICD-10-CM | POA: Diagnosis present

## 2022-09-24 HISTORY — PX: ANTERIOR CERVICAL DECOMP/DISCECTOMY FUSION: SHX1161

## 2022-09-24 SURGERY — ANTERIOR CERVICAL DECOMPRESSION/DISCECTOMY FUSION 3 LEVELS
Anesthesia: General | Site: Spine Cervical

## 2022-09-24 MED ORDER — CHLORHEXIDINE GLUCONATE 0.12 % MT SOLN
15.0000 mL | Freq: Once | OROMUCOSAL | Status: AC
  Administered 2022-09-24: 15 mL via OROMUCOSAL
  Filled 2022-09-24: qty 15

## 2022-09-24 MED ORDER — PHENOL 1.4 % MT LIQD: 1.0000 | OROMUCOSAL | Status: DC | PRN

## 2022-09-24 MED ORDER — METHOCARBAMOL 1000 MG/10ML IJ SOLN: 500.0000 mg | Freq: Four times a day (QID) | INTRAVENOUS | Status: DC | PRN

## 2022-09-24 MED ORDER — FENTANYL CITRATE (PF) 250 MCG/5ML IJ SOLN
INTRAMUSCULAR | Status: DC | PRN
  Administered 2022-09-24: 100 ug via INTRAVENOUS
  Administered 2022-09-24 (×3): 50 ug via INTRAVENOUS

## 2022-09-24 MED ORDER — FENTANYL CITRATE (PF) 250 MCG/5ML IJ SOLN
INTRAMUSCULAR | Status: AC
  Filled 2022-09-24: qty 5

## 2022-09-24 MED ORDER — DEXAMETHASONE 4 MG PO TABS
4.0000 mg | ORAL_TABLET | Freq: Four times a day (QID) | ORAL | Status: DC
  Administered 2022-09-24 – 2022-09-25 (×2): 4 mg via ORAL
  Filled 2022-09-24 (×6): qty 1

## 2022-09-24 MED ORDER — DEXAMETHASONE SODIUM PHOSPHATE 4 MG/ML IJ SOLN
4.0000 mg | Freq: Four times a day (QID) | INTRAMUSCULAR | Status: DC
  Administered 2022-09-24 (×2): 4 mg via INTRAVENOUS
  Filled 2022-09-24 (×2): qty 1

## 2022-09-24 MED ORDER — SODIUM CHLORIDE 0.9% FLUSH
3.0000 mL | Freq: Two times a day (BID) | INTRAVENOUS | Status: DC
  Administered 2022-09-24 (×2): 3 mL via INTRAVENOUS

## 2022-09-24 MED ORDER — MORPHINE SULFATE (PF) 2 MG/ML IV SOLN: 2.0000 mg | INTRAVENOUS | Status: DC | PRN

## 2022-09-24 MED ORDER — ONDANSETRON HCL 4 MG/2ML IJ SOLN
INTRAMUSCULAR | Status: DC | PRN
  Administered 2022-09-24: 4 mg via INTRAVENOUS

## 2022-09-24 MED ORDER — THROMBIN 5000 UNITS EX SOLR
CUTANEOUS | Status: AC
  Filled 2022-09-24: qty 5000

## 2022-09-24 MED ORDER — BUPIVACAINE HCL (PF) 0.25 % IJ SOLN
INTRAMUSCULAR | Status: AC
  Filled 2022-09-24: qty 30

## 2022-09-24 MED ORDER — TAMSULOSIN HCL 0.4 MG PO CAPS
0.4000 mg | ORAL_CAPSULE | Freq: Two times a day (BID) | ORAL | Status: DC
  Administered 2022-09-24: 0.4 mg via ORAL
  Filled 2022-09-24: qty 1

## 2022-09-24 MED ORDER — GABAPENTIN 300 MG PO CAPS
300.0000 mg | ORAL_CAPSULE | ORAL | Status: AC
  Administered 2022-09-24: 300 mg via ORAL
  Filled 2022-09-24: qty 1

## 2022-09-24 MED ORDER — 0.9 % SODIUM CHLORIDE (POUR BTL) OPTIME
TOPICAL | Status: DC | PRN
  Administered 2022-09-24: 1000 mL

## 2022-09-24 MED ORDER — FENTANYL CITRATE (PF) 100 MCG/2ML IJ SOLN: 25.0000 ug | INTRAMUSCULAR | Status: DC | PRN

## 2022-09-24 MED ORDER — HYDROMORPHONE HCL 1 MG/ML IJ SOLN: 0.2500 mg | INTRAMUSCULAR | Status: DC | PRN

## 2022-09-24 MED ORDER — AMISULPRIDE (ANTIEMETIC) 5 MG/2ML IV SOLN: 10.0000 mg | Freq: Once | INTRAVENOUS | Status: DC | PRN

## 2022-09-24 MED ORDER — OXYCODONE HCL 5 MG PO TABS
5.0000 mg | ORAL_TABLET | ORAL | Status: DC | PRN
  Administered 2022-09-24 (×2): 5 mg via ORAL
  Filled 2022-09-24 (×2): qty 1

## 2022-09-24 MED ORDER — SODIUM CHLORIDE 0.9% FLUSH: 3.0000 mL | INTRAVENOUS | Status: DC | PRN

## 2022-09-24 MED ORDER — ONDANSETRON HCL 4 MG/2ML IJ SOLN: 4.0000 mg | Freq: Four times a day (QID) | INTRAMUSCULAR | Status: DC | PRN

## 2022-09-24 MED ORDER — SODIUM CHLORIDE 0.9 % IV SOLN
250.0000 mL | INTRAVENOUS | Status: DC
  Administered 2022-09-24: 250 mL via INTRAVENOUS

## 2022-09-24 MED ORDER — CHLORHEXIDINE GLUCONATE CLOTH 2 % EX PADS: 6.0000 | MEDICATED_PAD | Freq: Once | CUTANEOUS | Status: DC

## 2022-09-24 MED ORDER — TIZANIDINE HCL 4 MG PO TABS: 4.0000 mg | ORAL_TABLET | Freq: Three times a day (TID) | ORAL | Status: DC | PRN

## 2022-09-24 MED ORDER — POTASSIUM CHLORIDE IN NACL 20-0.9 MEQ/L-% IV SOLN: INTRAVENOUS | Status: DC

## 2022-09-24 MED ORDER — MENTHOL 3 MG MT LOZG
1.0000 | LOZENGE | OROMUCOSAL | Status: DC | PRN
  Filled 2022-09-24: qty 9

## 2022-09-24 MED ORDER — DEXAMETHASONE SODIUM PHOSPHATE 10 MG/ML IJ SOLN
INTRAMUSCULAR | Status: DC | PRN
  Administered 2022-09-24: 10 mg via INTRAVENOUS

## 2022-09-24 MED ORDER — METHOCARBAMOL 500 MG PO TABS
500.0000 mg | ORAL_TABLET | Freq: Four times a day (QID) | ORAL | Status: DC | PRN
  Administered 2022-09-24 – 2022-09-25 (×3): 500 mg via ORAL
  Filled 2022-09-24 (×3): qty 1

## 2022-09-24 MED ORDER — PHENYLEPHRINE HCL-NACL 20-0.9 MG/250ML-% IV SOLN
INTRAVENOUS | Status: DC | PRN
  Administered 2022-09-24: 30 ug/min via INTRAVENOUS

## 2022-09-24 MED ORDER — SENNA 8.6 MG PO TABS
1.0000 | ORAL_TABLET | Freq: Two times a day (BID) | ORAL | Status: DC
  Administered 2022-09-24 (×2): 8.6 mg via ORAL
  Filled 2022-09-24 (×2): qty 1

## 2022-09-24 MED ORDER — THROMBIN 5000 UNITS EX SOLR
OROMUCOSAL | Status: DC | PRN
  Administered 2022-09-24 (×2): 5 mL via TOPICAL

## 2022-09-24 MED ORDER — SUGAMMADEX SODIUM 200 MG/2ML IV SOLN
INTRAVENOUS | Status: DC | PRN
  Administered 2022-09-24: 200 mg via INTRAVENOUS

## 2022-09-24 MED ORDER — PROPOFOL 10 MG/ML IV BOLUS
INTRAVENOUS | Status: DC | PRN
  Administered 2022-09-24: 110 mg via INTRAVENOUS

## 2022-09-24 MED ORDER — ONDANSETRON HCL 4 MG PO TABS: 4.0000 mg | ORAL_TABLET | Freq: Four times a day (QID) | ORAL | Status: DC | PRN

## 2022-09-24 MED ORDER — ACETAMINOPHEN 500 MG PO TABS
1000.0000 mg | ORAL_TABLET | ORAL | Status: AC
  Administered 2022-09-24: 1000 mg via ORAL
  Filled 2022-09-24: qty 2

## 2022-09-24 MED ORDER — OXYCODONE HCL 5 MG PO TABS
5.0000 mg | ORAL_TABLET | ORAL | Status: DC | PRN
  Administered 2022-09-24: 10 mg via ORAL
  Administered 2022-09-25 (×2): 5 mg via ORAL
  Filled 2022-09-24: qty 2
  Filled 2022-09-24 (×2): qty 1

## 2022-09-24 MED ORDER — PROMETHAZINE HCL 25 MG/ML IJ SOLN: 6.2500 mg | INTRAMUSCULAR | Status: DC | PRN

## 2022-09-24 MED ORDER — LACTATED RINGERS IV SOLN: INTRAVENOUS | Status: DC

## 2022-09-24 MED ORDER — EPHEDRINE SULFATE-NACL 50-0.9 MG/10ML-% IV SOSY
PREFILLED_SYRINGE | INTRAVENOUS | Status: DC | PRN
  Administered 2022-09-24 (×4): 5 mg via INTRAVENOUS

## 2022-09-24 MED ORDER — VANCOMYCIN HCL IN DEXTROSE 1-5 GM/200ML-% IV SOLN
1000.0000 mg | INTRAVENOUS | Status: AC
  Administered 2022-09-24: 1000 mg via INTRAVENOUS
  Filled 2022-09-24: qty 200

## 2022-09-24 MED ORDER — PANTOPRAZOLE SODIUM 40 MG PO TBEC: 40.0000 mg | DELAYED_RELEASE_TABLET | Freq: Every day | ORAL | Status: DC

## 2022-09-24 MED ORDER — ROCURONIUM BROMIDE 10 MG/ML (PF) SYRINGE
PREFILLED_SYRINGE | INTRAVENOUS | Status: DC | PRN
  Administered 2022-09-24 (×2): 20 mg via INTRAVENOUS
  Administered 2022-09-24: 60 mg via INTRAVENOUS
  Administered 2022-09-24: 20 mg via INTRAVENOUS

## 2022-09-24 MED ORDER — ACETAMINOPHEN 500 MG PO TABS
1000.0000 mg | ORAL_TABLET | Freq: Four times a day (QID) | ORAL | Status: AC
  Administered 2022-09-24 – 2022-09-25 (×4): 1000 mg via ORAL
  Filled 2022-09-24 (×4): qty 2

## 2022-09-24 MED ORDER — BUPIVACAINE HCL (PF) 0.25 % IJ SOLN
INTRAMUSCULAR | Status: DC | PRN
  Administered 2022-09-24: 9 mL

## 2022-09-24 MED ORDER — PROPOFOL 10 MG/ML IV BOLUS
INTRAVENOUS | Status: AC
  Filled 2022-09-24: qty 20

## 2022-09-24 MED ORDER — LIDOCAINE 2% (20 MG/ML) 5 ML SYRINGE
INTRAMUSCULAR | Status: DC | PRN
  Administered 2022-09-24: 60 mg via INTRAVENOUS

## 2022-09-24 MED ORDER — ORAL CARE MOUTH RINSE: 15.0000 mL | Freq: Once | OROMUCOSAL | Status: AC

## 2022-09-24 MED ORDER — VANCOMYCIN HCL IN DEXTROSE 1-5 GM/200ML-% IV SOLN
1000.0000 mg | Freq: Once | INTRAVENOUS | Status: AC
  Administered 2022-09-24: 1000 mg via INTRAVENOUS

## 2022-09-24 SURGICAL SUPPLY — 58 items
BAG COUNTER SPONGE SURGICOUNT (BAG) ×1 IMPLANT
BAND RUBBER #18 3X1/16 STRL (MISCELLANEOUS) ×2 IMPLANT
BASKET BONE COLLECTION (BASKET) IMPLANT
BENZOIN TINCTURE PRP APPL 2/3 (GAUZE/BANDAGES/DRESSINGS) ×1 IMPLANT
BUR CARBIDE MATCH 3.0 (BURR) ×1 IMPLANT
CANISTER SUCT 3000ML PPV (MISCELLANEOUS) ×1 IMPLANT
DERMABOND ADVANCED .7 DNX12 (GAUZE/BANDAGES/DRESSINGS) IMPLANT
DEVICE ENDSKLTN IMPL 16X14X7X6 (Cage) IMPLANT
DEVICE ENDSKLTN TC NANOLCK 6MM (Cage) IMPLANT
DRAPE C-ARM 42X72 X-RAY (DRAPES) ×2 IMPLANT
DRAPE LAPAROTOMY 100X72 PEDS (DRAPES) ×1 IMPLANT
DRAPE MICROSCOPE SLANT 54X150 (MISCELLANEOUS) ×1 IMPLANT
DRSG OPSITE POSTOP 4X6 (GAUZE/BANDAGES/DRESSINGS) IMPLANT
DURAPREP 6ML APPLICATOR 50/CS (WOUND CARE) ×1 IMPLANT
ELECT COATED BLADE 2.86 ST (ELECTRODE) ×1 IMPLANT
ELECT REM PT RETURN 9FT ADLT (ELECTROSURGICAL) ×1
ELECTRODE REM PT RTRN 9FT ADLT (ELECTROSURGICAL) ×1 IMPLANT
ENDOSKELETON IMPLANT 16X14X7X6 (Cage) ×1 IMPLANT
ENDOSKELETON TC NANOLOCK 6MM (Cage) ×2 IMPLANT
GAUZE 4X4 16PLY ~~LOC~~+RFID DBL (SPONGE) IMPLANT
GLOVE BIO SURGEON STRL SZ7 (GLOVE) IMPLANT
GLOVE BIO SURGEON STRL SZ8 (GLOVE) ×1 IMPLANT
GLOVE BIOGEL PI IND STRL 7.0 (GLOVE) IMPLANT
GOWN STRL REUS W/ TWL LRG LVL3 (GOWN DISPOSABLE) IMPLANT
GOWN STRL REUS W/ TWL XL LVL3 (GOWN DISPOSABLE) ×1 IMPLANT
GOWN STRL REUS W/TWL 2XL LVL3 (GOWN DISPOSABLE) IMPLANT
GOWN STRL REUS W/TWL LRG LVL3 (GOWN DISPOSABLE)
GOWN STRL REUS W/TWL XL LVL3 (GOWN DISPOSABLE) ×1
HEMOSTAT POWDER KIT SURGIFOAM (HEMOSTASIS) ×1 IMPLANT
KIT BASIN OR (CUSTOM PROCEDURE TRAY) ×1 IMPLANT
KIT TURNOVER KIT B (KITS) ×1 IMPLANT
NDL HYPO 25X1 1.5 SAFETY (NEEDLE) ×1 IMPLANT
NDL SPNL 20GX3.5 QUINCKE YW (NEEDLE) ×1 IMPLANT
NEEDLE HYPO 25X1 1.5 SAFETY (NEEDLE) ×1 IMPLANT
NEEDLE SPNL 20GX3.5 QUINCKE YW (NEEDLE) ×1 IMPLANT
NS IRRIG 1000ML POUR BTL (IV SOLUTION) ×1 IMPLANT
PACK LAMINECTOMY NEURO (CUSTOM PROCEDURE TRAY) ×1 IMPLANT
PAD ARMBOARD 7.5X6 YLW CONV (MISCELLANEOUS) ×1 IMPLANT
PIN DISTRACTION 14MM (PIN) IMPLANT
PLATE 3 62.5XLCK NS SPNE CVD (Plate) IMPLANT
PLATE 3 ATLANTIS TRANS (Plate) ×1 IMPLANT
PUTTY DBF 3CC CORTICAL FIBERS (Putty) IMPLANT
SCREW SELF TAP VAR 4.0X13 (Screw) IMPLANT
SCREW ST 15X4.5XST VAR NS (Screw) IMPLANT
SCREW ST 15X4XST FXANG NS (Screw) IMPLANT
SCREW ST 15X4XST VA NS SPNE (Screw) IMPLANT
SCREW ST FIX 4 ATL (Screw) ×5 IMPLANT
SCREW ST VAR 4 ATL (Screw) IMPLANT
SCREW ST VAR 4.5 ATL (Screw) IMPLANT
SCREW VA SD 4.5X15 (Screw) IMPLANT
SPONGE INTESTINAL PEANUT (DISPOSABLE) ×1 IMPLANT
SPONGE SURGIFOAM ABS GEL 100 (HEMOSTASIS) IMPLANT
STRIP CLOSURE SKIN 1/2X4 (GAUZE/BANDAGES/DRESSINGS) ×1 IMPLANT
SUT VIC AB 3-0 SH 8-18 (SUTURE) ×1 IMPLANT
SUT VICRYL 4-0 PS2 18IN ABS (SUTURE) IMPLANT
TOWEL GREEN STERILE (TOWEL DISPOSABLE) ×1 IMPLANT
TOWEL GREEN STERILE FF (TOWEL DISPOSABLE) ×1 IMPLANT
WATER STERILE IRR 1000ML POUR (IV SOLUTION) ×1 IMPLANT

## 2022-09-24 NOTE — Progress Notes (Signed)
Orthopedic Tech Progress Note Patient Details:  Peter Drake October 29, 1945 [id number]  Ortho Devices Type of Ortho Device: Soft collar Ortho Device/Splint Interventions: Ordered   Post Interventions Instructions Provided: Care of device, Adjustment of device Dropped off with NT in room. Vernona Rieger 09/24/2022, 11:59 PM

## 2022-09-24 NOTE — H&P (Signed)
Subjective:   Patient is a 76 y.o. male admitted for cervical stenosis. The patient first presented to me with complaints of neck pain and numbness of the arm(s). Onset of symptoms was several months ago. The pain is described as aching and occurs all day. The pain is rated moderate, and is located in the neck and radiates to the shoulders and LUE. The symptoms have been progressive. Symptoms are exacerbated by extending head backwards, and are relieved by none.  Previous work up includes MRI of cervical spine, results: spinal stenosis.  Past Medical History:  Diagnosis Date   Allergy    pcn   Amaurosis fugax    Anxiety    Arthritis    HX: of OA   BPH (benign prostatic hypertrophy)    Hx: of   Cancer ([place]) 06/23/2008   L vocoal cord sq cell ca   Colorectal polyps 10/2001   Dyslipidemia    Early cataracts, bilateral    Hx: of   GERD (gastroesophageal reflux disease)    H/O Mohs micrographic surgery for skin cancer    Headache    has aura's associated with migraines   History of radiation therapy 08/04/08-09/20/08   larynx   Hyperlipidemia    PVC (premature ventricular contraction)    > 20 years, occasional PVCs on monitor, no specific treatment recommended by cardiology then (as of 09/16/22)ported   Radiation 08/04/08-09/20/08   Larynx   33 treatments   Sleep apnea    borderline no cpap needed    Past Surgical History:  Procedure Laterality Date   basal cell excisions     by dr. hall   CERVICAL SPINE SURGERY     COLONOSCOPY W/ BIOPSIES AND POLYPECTOMY     Hx: of   COLONOSCOPY WITH PROPOFOL N/A 12/23/2018   Procedure: COLONOSCOPY WITH PROPOFOL;  Surgeon: Juanita Craver, MD;  Location: [facility];  Service: Endoscopy;  Laterality: N/A;   ESOPHAGOGASTRODUODENOSCOPY (EGD) WITH PROPOFOL N/A 12/23/2018   Procedure: ESOPHAGOGASTRODUODENOSCOPY (EGD) WITH PROPOFOL;  Surgeon: Juanita Craver, MD;  Location: [facility];  Service: Endoscopy;  Laterality: N/A;   EYE SURGERY     bil with  implant   FINGER SURGERY     right fifth   INGUINAL HERNIA REPAIR   03/2005   left    left knee arthoscopy     x 2   MOHS SURGERY     x3   NASAL SINUS SURGERY  1990's   polyp removal     squamous cell  33 radiation treatments vocal cord   POLYPECTOMY  12/23/2018   Procedure: POLYPECTOMY;  Surgeon: Juanita Craver, MD;  Location: [facility];  Service: Endoscopy;;   RADIAL KERATOTOMY     20 years ago/ bi lateral   TONSILLECTOMY     as a child   TOTAL HIP ARTHROPLASTY Left 05/06/2022   Procedure: TOTAL HIP ARTHROPLASTY ANTERIOR APPROACH;  Surgeon: Paralee Cancel, MD;  Location: [facility];  Service: Orthopedics;  Laterality: Left;   TOTAL SHOULDER ARTHROPLASTY Right 11/03/2013   DR SUPPLE   TOTAL SHOULDER ARTHROPLASTY Right 11/02/2013   Procedure: RIGHT TOTAL SHOULDER ARTHROPLASTY;  Surgeon: Marin Shutter, MD;  Location: [facility];  Service: Orthopedics;  Laterality: Right;   TOTAL SHOULDER ARTHROPLASTY Left 10/04/2020   Procedure: TOTAL SHOULDER ARTHROPLASTY;  Surgeon: Justice Britain, MD;  Location: [facility];  Service: Orthopedics;  Laterality: Left;  177mn    Allergies  Allergen Reactions   Penicillins Hives    Did it involve swelling of the  face/tongue/throat, SOB, or low BP? Unknown Did it involve sudden or severe rash/hives, skin peeling, or any reaction on the inside of your mouth or nose? Unknown Did you need to seek medical attention at a hospital or doctor's office? Unknown When did it last happen?      Childhood allergy If all above answers are "NO", may proceed with cephalosporin use. Patient tolerated ancef administration without adverse reaction.    Social History   Tobacco Use   Smoking status: Former    Packs/day: 1.00    Years: 15.00    Total pack years: 15.00    Types: Cigarettes    Quit date: 1980    Years since quitting: 43.9   Smokeless tobacco: Former   Tobacco comments:    quit smoking 1980,then smokless tobaco until 1996  Substance Use Topics   Alcohol use: Yes     Alcohol/week: 20.0 - 24.0 standard drinks of alcohol    Types: 20 - 24 Cans of beer per week    Comment: moderate  beer, over 40 years    Family History  Problem Relation Age of Onset   Heart disease Mother    COPD Father    Rectal cancer Sister    Cancer Sister        rectal   Melanoma Daughter 79       treated   Prior to Admission medications   Medication Sig Start Date End Date Taking? Authorizing Provider  docusate sodium (COLACE) 100 MG capsule Take 1 capsule (100 mg total) by mouth 2 (two) times daily. Patient taking differently: Take 100 mg by mouth daily as needed for mild constipation or moderate constipation. 05/07/22  Yes Irving Copas, PA-C  fluticasone (FLONASE) 50 MCG/ACT nasal spray Place 2 sprays into both nostrils at bedtime.   Yes [provider]  pantoprazole (PROTONIX) 40 MG tablet Take 40 mg by mouth daily.   Yes [provider]  simvastatin (ZOCOR) 20 MG tablet Take 20 mg by mouth daily.   Yes [provider]  Tamsulosin HCl (FLOMAX) 0.4 MG CAPS Take 0.4 mg by mouth 2 (two) times daily.    Yes [provider]  tiZANidine (ZANAFLEX) 4 MG tablet Take 4 mg by mouth 3 (three) times daily as needed (Pain).   Yes [provider]  traZODone (DESYREL) 50 MG tablet Take 50-100 mg by mouth at bedtime. 11/25/21  Yes [provider]  HYDROcodone-acetaminophen (NORCO/VICODIN) 5-325 MG tablet Take 1 tablet by mouth every 4 (four) hours as needed for severe pain. Patient not taking: Reported on 09/08/2022 05/07/22   Irving Copas, PA-C  polyethylene glycol (MIRALAX / GLYCOLAX) 17 g packet Take 17 g by mouth daily as needed for mild constipation. Patient not taking: Reported on 09/08/2022 05/07/22   Irving Copas, PA-C     Review of Systems  Positive ROS: neg  All other systems have been reviewed and were otherwise negative with the exception of those mentioned in the HPI and as above.  Objective: Vital signs  in last 24 hours: Temp:  [98 F (36.7 C)] 98 F (36.7 C) (12/06 0701) Pulse Rate:  [60] 60 (12/06 0701) Resp:  [17] 17 (12/06 0701) BP: (128)/(81) 128/81 (12/06 0701) SpO2:  [98 %] 98 % (12/06 0701) Weight:  [72.6 kg] 72.6 kg (12/06 0701)  General Appearance: Alert, cooperative, no distress, appears stated age Head: Normocephalic, without obvious abnormality, atraumatic Eyes: PERRL, conjunctiva/corneas clear, EOM's intact      Neck:  Supple, symmetrical, trachea midline, Back: Symmetric, no curvature, ROM normal, no CVA tenderness Lungs:  respirations unlabored Heart: Regular rate and rhythm Abdomen: Soft, non-tender Extremities: Extremities normal, atraumatic, no cyanosis or edema Pulses: 2+ and symmetric all extremities Skin: Skin color, texture, turgor normal, no rashes or lesions  NEUROLOGIC:  Mental status: Alert and oriented x4, no aphasia, good attention span, fund of knowledge and memory  Motor Exam - grossly normal Sensory Exam - grossly normal Reflexes: 2+ Coordination - grossly normal Gait - grossly normal Balance - grossly normal Cranial Nerves: I: smell Not tested  II: visual acuity  OS: nl    OD: nl  II: visual fields Full to confrontation  II: pupils Equal, round, reactive to light  III,VII: ptosis None  III,IV,VI: extraocular muscles  Full ROM  V: mastication Normal  V: facial light touch sensation  Normal  V,VII: corneal reflex  Present  VII: facial muscle function - upper  Normal  VII: facial muscle function - lower Normal  VIII: hearing Not tested  IX: soft palate elevation  Normal  IX,X: gag reflex Present  XI: trapezius strength  5/5  XI: sternocleidomastoid strength 5/5  XI: neck flexion strength  5/5  XII: tongue strength  Normal    Data Review Lab Results  Component Value Date   WBC 5.1 09/15/2022   HGB 15.5 09/15/2022   HCT 45.2 09/15/2022   MCV 92.1 09/15/2022   PLT 182 09/15/2022   Lab Results  Component Value Date   NA 134 (L)  09/15/2022   K 4.2 09/15/2022   CL 100 09/15/2022   CO2 27 09/15/2022   BUN 6 (L) 09/15/2022   CREATININE 0.73 09/15/2022   GLUCOSE 89 09/15/2022   Lab Results  Component Value Date   INR 1.1 09/15/2022    Assessment:   Cervical neck pain with herniated nucleus pulposus/ spondylosis/ stenosis at C3-4 C4-5 C5-6. Estimated body mass index is 23.63 kg/m as calculated from the following:   Height as of this encounter: '5\' 9"'$  (1.753 m).   Weight as of this encounter: 72.6 kg.  Patient has failed conservative therapy. Planned surgery : ACDF C3-4 C4-5 C5-6  Plan:   I explained the condition and procedure to the patient and answered any questions.  Patient wishes to proceed with procedure as planned. Understands risks/ benefits/ and expected or typical outcomes.  Eustace Moore 09/24/2022 8:10 AM

## 2022-09-24 NOTE — Transfer of Care (Signed)
Immediate Anesthesia Transfer of Care Note  Patient: Peter Drake  Procedure(s) Performed: ANTERIOR CERVICAL DISCECTOMY FUSION CERVICAL THREE - CERVICAL FOUR, CERVICAL FOUR - CERVICAL FIVE, CERVICAL FIVE - CERVICAL SIX (Spine Cervical)  Patient Location: PACU  Anesthesia Type:General  Level of Consciousness: awake, alert , and oriented  Airway & Oxygen Therapy: Patient Spontanous Breathing  Post-op Assessment: Report given to RN and Post -op Vital signs reviewed and stable  Post vital signs: Reviewed and stable  Last Vitals:  Vitals Value Taken Time  BP 132/81 09/24/22 1200  Temp    Pulse 84 09/24/22 1201  Resp 18 09/24/22 1201  SpO2 93 % 09/24/22 1201  Vitals shown include unvalidated device data.  Last Pain:  Vitals:   09/24/22 0701  TempSrc: Oral         Complications: No notable events documented.

## 2022-09-24 NOTE — Progress Notes (Signed)
Pharmacy Antibiotic Note  Peter Drake is a 76 y.o. male admitted on 09/24/2022.  Pharmacy has been consulted for vancomycin dosing for prophylaxis. No drain in place  Plan: Vancomycin '1000mg'$  x1   Height: '5\' 9"'$  (175.3 cm) Weight: 72.6 kg (160 lb) IBW/kg (Calculated) : 70.7  Temp (24hrs), Avg:98.1 F (36.7 C), Min:97.8 F (36.6 C), Max:98.5 F (36.9 C)  No results for input(s): "WBC", "CREATININE", "LATICACIDVEN", "VANCOTROUGH", "VANCOPEAK", "VANCORANDOM", "GENTTROUGH", "GENTPEAK", "GENTRANDOM", "TOBRATROUGH", "TOBRAPEAK", "TOBRARND", "AMIKACINPEAK", "AMIKACINTROU", "AMIKACIN" in the last 168 hours.  Estimated Creatinine Clearance: 77.3 mL/min (by C-G formula based on SCr of 0.73 mg/dL).    Allergies  Allergen Reactions   Penicillins Hives    Did it involve swelling of the face/tongue/throat, SOB, or low BP? Unknown Did it involve sudden or severe rash/hives, skin peeling, or any reaction on the inside of your mouth or nose? Unknown Did you need to seek medical attention at a hospital or doctor's office? Unknown When did it last happen?      Childhood allergy If all above answers are "NO", may proceed with cephalosporin use. Patient tolerated ancef administration without adverse reaction.     Thank you for allowing pharmacy to be a part of this patient's care. Cristela Felt, PharmD, BCPS Clinical Pharmacist 09/24/2022 1:31 PM

## 2022-09-24 NOTE — Op Note (Signed)
09/24/2022  11:56 AM  PATIENT:  Peter Drake  76 y.o. male  PRE-OPERATIVE DIAGNOSIS: Cervical spondylosis with cervical spinal stenosis C3-4 C4-5 C5-6 with neck pain and radiculopathy  POST-OPERATIVE DIAGNOSIS:  same  PROCEDURE:  1. Decompressive anterior cervical discectomy C3-4 C4-5 C5-6, 2. Anterior cervical arthrodesis C3-4 C4-5 C5-6 utilizing a Titan interbody cage packed with locally harvested morcellized autologous bone graft and DBM, 3. Anterior cervical plating C3-C6 inclusive utilizing a [employer] translational plate  SURGEON:  Sherley Bounds, MD  ASSISTANTS: Glenford Peers, FNP  ANESTHESIA:   General  EBL: Less than 50 ml  Total I/O In: 1000 [I.V.:1000] Out: -   BLOOD ADMINISTERED: none  DRAINS: none  SPECIMEN:  none  INDICATION FOR PROCEDURE: This patient presented with back pain and arm pain. Imaging showed significant spondylosis with stenosis C3-4 C4-5 C5-6. The patient tried conservative measures without relief. Pain was debilitating. Recommended ACDF with plating. Patient understood the risks, benefits, and alternatives and potential outcomes and wished to proceed.  PROCEDURE DETAILS: Patient was brought to the operating room placed under general endotracheal anesthesia. Patient was placed in the supine position on the operating room table. The neck was prepped with Duraprep and draped in a sterile fashion.   Three cc of local anesthesia was injected and a transverse incision was made on the right side of the neck.  Dissection was carried down thru the subcutaneous tissue and the platysma was  elevated, opened, and undermined with Metzenbaum scissors.  Dissection was then carried out thru an avascular plane leaving the sternocleidomastoid carotid artery and jugular vein laterally and the trachea and esophagus medially with the assistance of my nurse practitioner.  The old plate was identified and bluntly dissected.  The 4 screws were removed and the plate at H3-7  was removed.  The fusion here appeared to be solid.  The ventral aspect of the vertebral column was identified and a localizing x-ray was taken. The C3-4 and C4-5 level was identified and all in the room agreed with the level. The longus colli muscles were then elevated and the retractor was placed with the assistance of my nurse practitioner to expose C3-4 C4-5 and C5-6. The annulus was incised at all 3 levels and the disc space entered. Discectomy was performed with micro-curettes and pituitary rongeurs. I then used the high-speed drill to drill the endplates down to the level of the posterior longitudinal ligament. The drill shavings were saved in a mucous trap for later arthrodesis. The operating microscope was draped and brought into the field provided additional magnification, illumination and visualization. Discectomy was continued posteriorly thru the disc space. Posterior longitudinal ligament was opened with a nerve hook, and then removed along with disc herniation and osteophytes, decompressing the spinal canal and thecal sac. We then continued to remove osteophytic overgrowth and disc material decompressing the neural foramina and exiting nerve roots bilaterally. The scope was angled up and down to help decompress and undercut the vertebral bodies. Once the decompression was completed we could pass a nerve hook circumferentially to assure adequate decompression in the midline and in the neural foramina. So by both visualization and palpation we felt we had an adequate decompression of the neural elements. We then measured the height of the intravertebral disc space and selected a 7 millimeter Titan interbody cage packed with autograft and DBM. It was then gently positioned in the intravertebral disc space(s) and countersunk in all 3 disc bases. I then used a Atlantis translational plate and placed 15  mm fixed angle screws into the vertebral bodies of each level and locked them into position. The wound  was irrigated with bacitracin solution, checked for hemostasis which was established and confirmed. Once meticulous hemostasis was achieved, we then proceeded with closure with the assistance of my nurse practitioner. The platysma was closed with interrupted 3-0 undyed Vicryl suture, the subcuticular layer was closed with interrupted 3-0 undyed Vicryl suture. The skin edges were approximated with steristrips. The drapes were removed. A sterile dressing was applied. The patient was then awakened from general anesthesia and transferred to the recovery room in stable condition. At the end of the procedure all sponge, needle and instrument counts were correct.   PLAN OF CARE: Admit to inpatient   PATIENT DISPOSITION:  PACU - hemodynamically stable.   Delay start of Pharmacological VTE agent (>24hrs) due to surgical blood loss or risk of bleeding:  yes

## 2022-09-24 NOTE — Anesthesia Procedure Notes (Signed)
Procedure Name: Intubation Date/Time: 09/24/2022 8:45 AM  Performed by: Griffin Dakin, CRNAPre-anesthesia Checklist: Patient identified, Emergency Drugs available, Suction available and Patient being monitored Patient Re-evaluated:Patient Re-evaluated prior to induction Oxygen Delivery Method: Circle system utilized Preoxygenation: Pre-oxygenation with 100% oxygen Induction Type: IV induction Ventilation: Mask ventilation without difficulty Laryngoscope Size: Glidescope and 4 Grade View: Grade I Tube type: Oral Tube size: 7.5 mm Number of attempts: 1 Airway Equipment and Method: Rigid stylet and Video-laryngoscopy Placement Confirmation: ETT inserted through vocal cords under direct vision, positive ETCO2 and breath sounds checked- equal and bilateral Secured at: 22 cm Tube secured with: Tape Dental Injury: Teeth and Oropharynx as per pre-operative assessment  Comments: Glide used electively to maintain neurtral neck due to tingling in left arm

## 2022-09-24 NOTE — Anesthesia Postprocedure Evaluation (Signed)
Anesthesia Post Note  Patient: [place]  Procedure(s) Performed: ANTERIOR CERVICAL DISCECTOMY FUSION CERVICAL THREE - CERVICAL FOUR, CERVICAL FOUR - CERVICAL FIVE, CERVICAL FIVE - CERVICAL SIX (Spine Cervical)     Patient location during evaluation: PACU Anesthesia Type: General Level of consciousness: awake and alert Pain management: pain level controlled Vital Signs Assessment: post-procedure vital signs reviewed and stable Respiratory status: spontaneous breathing, nonlabored ventilation and respiratory function stable Cardiovascular status: blood pressure returned to baseline and stable Postop Assessment: no apparent nausea or vomiting Anesthetic complications: no  No notable events documented.  Last Vitals:  Vitals:   09/24/22 1300 09/24/22 1323  BP: (!) 110/50 139/77  Pulse: 75 78  Resp: 10 18  Temp: 36.6 C 36.6 C  SpO2: 95% 98%    Last Pain:  Vitals:   09/24/22 1300  TempSrc:   PainSc: 0-No pain      LLE Sensation: Numbness;Tingling (09/24/22 1334)   RLE Sensation: Full sensation (09/24/22 1334)      Aailyah Dunbar,W. EDMOND

## 2022-09-25 MED ORDER — TIZANIDINE HCL 4 MG PO TABS: 4.0000 mg | ORAL_TABLET | Freq: Three times a day (TID) | ORAL | 0 refills | Status: AC | PRN

## 2022-09-25 MED ORDER — HYDROCODONE-ACETAMINOPHEN 5-325 MG PO TABS: 1.0000 | ORAL_TABLET | ORAL | 0 refills | Status: DC | PRN

## 2022-09-25 MED FILL — Thrombin For Soln 5000 Unit: CUTANEOUS | Qty: 5000 | Status: AC

## 2022-09-25 NOTE — Discharge Summary (Signed)
Physician Discharge Summary  Patient ID: Peter Drake MRN: [id number] DOB/AGE: 04-22-1946 76 y.o.  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 The patient tolerated the procedure well and was taken to the recovery room and then to the floor in stable condition. The hospital course was routine. There were no complications. The wound remained clean dry and intact. Pt had appropriate neck soreness. No complaints of arm pain or new N/T/W. The patient remained afebrile with stable vital signs, and tolerated a regular diet. The patient continued to increase activities, and pain was well controlled with oral pain medications.   Consults: None  Significant Diagnostic Studies:  Results for orders placed or performed during the hospital encounter of 09/15/22  Surgical pcr screen   Specimen: Nasal Mucosa; Nasal Swab  Result Value Ref Range   MRSA, PCR NEGATIVE NEGATIVE   Staphylococcus aureus NEGATIVE NEGATIVE  Protime-INR  Result Value Ref Range   Prothrombin Time 14.0 11.4 - 15.2 seconds   INR 1.1 0.8 - 1.2  Comprehensive metabolic panel per protocol  Result Value Ref Range   Sodium 134 (L) 135 - 145 mmol/L   Potassium 4.2 3.5 - 5.1 mmol/L   Chloride 100 98 - 111 mmol/L   CO2 27 22 - 32 mmol/L   Glucose, Bld 89 70 - 99 mg/dL   BUN 6 (L) 8 - 23 mg/dL   Creatinine, Ser 0.73 0.61 - 1.24 mg/dL   Calcium 9.1 8.9 - 10.3 mg/dL   Total Protein 6.5 6.5 - 8.1 g/dL   Albumin 4.1 3.5 - 5.0 g/dL   AST 24 15 - 41 U/L   ALT 18 0 - 44 U/L   Alkaline Phosphatase 69 38 - 126 U/L   Total Bilirubin 0.9 0.3 - 1.2 mg/dL   GFR, Estimated >60 >60 mL/min   Anion gap 7 5 - 15  CBC per protocol  Result  Value Ref Range   WBC 5.1 4.0 - 10.5 K/uL   RBC 4.91 4.22 - 5.81 MIL/uL   Hemoglobin 15.5 13.0 - 17.0 g/dL   HCT 45.2 39.0 - 52.0 %   MCV 92.1 80.0 - 100.0 fL   MCH 31.6 26.0 - 34.0 pg   MCHC 34.3 30.0 - 36.0 g/dL   RDW 12.7 11.5 - 15.5 %   Platelets 182 150 - 400 K/uL   nRBC 0.0 0.0 - 0.2 %  Type and screen [place]  Result Value Ref Range   ABO/RH(D) O NEG    Antibody Screen NEG    Sample Expiration 09/29/2022,2359    Extend sample reason NO TRANSFUSIONS OR PREGNANCY IN THE PAST 3 MONTHS    Weak D      POS Performed at [facility], [address] [place], [place] [address]     DG Cervical Spine 1 View  Result Date: 09/24/2022 CLINICAL DATA:  Surgical anterior cervical fusion. EXAM: DG CERVICAL SPINE - 1 VIEW; DG C-ARM 1-60 MIN-NO REPORT Radiation exposure index: 8.28 mGy. COMPARISON:  August 02, 2020. FINDINGS: Two intraoperative fluoroscopic images were obtained of the cervical spine. The patient appears to be status post interval surgical anterior fusion of C3-4, C4-5 and C5-6. IMPRESSION: Fluoroscopic guidance provided during surgical cervical  anterior fusion. Electronically Signed   By: Marijo Conception M.D.   On: 09/24/2022 12:10   DG C-Arm 1-60 Min-No Report  Result Date: 09/24/2022 Fluoroscopy was utilized by the requesting physician.  No radiographic interpretation.   DG C-Arm 1-60 Min-No Report  Result Date: 09/24/2022 Fluoroscopy was utilized by the requesting physician.  No radiographic interpretation.   DG C-Arm 1-60 Min-No Report  Result Date: 09/24/2022 Fluoroscopy was utilized by the requesting physician.  No radiographic interpretation.    Antibiotics:  Anti-infectives (From admission, onward)    Start     Dose/Rate Route Frequency Ordered Stop   09/24/22 2000  vancomycin (VANCOCIN) IVPB 1000 mg/200 mL premix        1,000 mg 200 mL/hr over 60 Minutes Intravenous  Once 09/24/22 1333 09/24/22 1943   09/24/22 0730  vancomycin  (VANCOCIN) IVPB 1000 mg/200 mL premix        1,000 mg 200 mL/hr over 60 Minutes Intravenous On call to O.R. 09/24/22 0725 09/24/22 0815       Discharge Exam: Blood pressure (!) 136/90, pulse 72, temperature 98.7 F (37.1 C), temperature source Oral, resp. rate 18, height '5\' 9"'$  (1.753 m), weight 72.6 kg, SpO2 99 %. Neurologic: Grossly normal Incision clean dry and intact  Discharge Medications:   Allergies as of 09/25/2022       Reactions   Penicillins Hives   Did it involve swelling of the face/tongue/throat, SOB, or low BP? Unknown Did it involve sudden or severe rash/hives, skin peeling, or any reaction on the inside of your mouth or nose? Unknown Did you need to seek medical attention at a hospital or doctor's office? Unknown When did it last happen?      Childhood allergy If all above answers are "NO", may proceed with cephalosporin use. Patient tolerated ancef administration without adverse reaction.        Medication List     TAKE these medications    docusate sodium 100 MG capsule Commonly known as: COLACE Take 1 capsule (100 mg total) by mouth 2 (two) times daily. What changed:  when to take this reasons to take this   fluticasone 50 MCG/ACT nasal spray Commonly known as: FLONASE Place 2 sprays into both nostrils at bedtime.   HYDROcodone-acetaminophen 5-325 MG tablet Commonly known as: NORCO/VICODIN Take 1 tablet by mouth every 4 (four) hours as needed for severe pain. What changed: Another medication with the same name was added. Make sure you understand how and when to take each.   HYDROcodone-acetaminophen 5-325 MG tablet Commonly known as: NORCO/VICODIN Take 1 tablet by mouth every 4 (four) hours as needed for moderate pain. What changed: You were already taking a medication with the same name, and this prescription was added. Make sure you understand how and when to take each.   pantoprazole 40 MG tablet Commonly known as: PROTONIX Take 40 mg by  mouth daily.   polyethylene glycol 17 g packet Commonly known as: MIRALAX / GLYCOLAX Take 17 g by mouth daily as needed for mild constipation.   simvastatin 20 MG tablet Commonly known as: ZOCOR Take 20 mg by mouth daily.   tamsulosin 0.4 MG Caps capsule Commonly known as: FLOMAX Take 0.4 mg by mouth 2 (two) times daily.   tiZANidine 4 MG tablet Commonly known as: ZANAFLEX Take 1 tablet (4 mg total) by mouth 3 (three) times daily as needed (Pain).   traZODone 50 MG tablet Commonly known as: DESYREL Take 50-100 mg by mouth at  bedtime.        Disposition: Home   Final Dx: ACDF with plate C3-4 Q2-2 L7-9  Discharge Instructions     Call MD for:  difficulty breathing, headache or visual disturbances   Complete by: As directed    Call MD for:  hives   Complete by: As directed    Call MD for:  persistant dizziness or light-headedness   Complete by: As directed    Call MD for:  persistant nausea and vomiting   Complete by: As directed    Call MD for:  redness, tenderness, or signs of infection (pain, swelling, redness, odor or green/yellow discharge around incision site)   Complete by: As directed    Call MD for:  severe uncontrolled pain   Complete by: As directed    Diet - low sodium heart healthy   Complete by: As directed    Driving Restrictions   Complete by: As directed    No driving for 2 weeks, no riding in the car for 1 week   Increase activity slowly   Complete by: As directed    Lifting restrictions   Complete by: As directed    No lifting more than 8 lbs   No wound care   Complete by: As directed           Signed: Eustace Moore 09/25/2022, 8:03 AM

## 2022-09-25 NOTE — Evaluation (Signed)
Occupational Therapy Evaluation and Discharge Patient Details Name: Peter Drake MRN: [id number] DOB: 1946/02/26 Today's Date: 09/25/2022   History of Present Illness Pt is a 76 year old man admitted for ACDF 3-4, 4-5, 5-6. PMH: B TSA, L THA.   Clinical Impression   Pt is functioning modified independently in ADLs and independently in mobility. All education completed and reinforced with written handout. Pt verbalized and/or demonstrated understanding.      Recommendations for follow up therapy are one component of a multi-disciplinary discharge planning process, led by the attending physician.  Recommendations may be updated based on patient status, additional functional criteria and insurance authorization.   Follow Up Recommendations  No OT follow up     Assistance Recommended at Discharge PRN  Patient can return home with the following      Functional Status Assessment  Patient has had a recent decline in their functional status and demonstrates the ability to make significant improvements in function in a reasonable and predictable amount of time.  Equipment Recommendations  None recommended by OT    Recommendations for Other Services       Precautions / Restrictions Precautions Precautions: Cervical Precaution Booklet Issued: Yes (comment) Required Braces or Orthoses: Cervical Brace Cervical Brace: Soft collar;For comfort Restrictions Weight Bearing Restrictions: No      Mobility Bed Mobility Overal bed mobility: Modified Independent             General bed mobility comments: educated in log roll technique    Transfers Overall transfer level: Independent Equipment used: None                      Balance                                           ADL either performed or assessed with clinical judgement   ADL Overall ADL's : Modified independent                                       General ADL  Comments: Educated pt in body mechanics and cervical precautions during ADLs and IADLs and IADLs to avoid.     Vision Baseline Vision/History: 0 No visual deficits       Perception     Praxis      Pertinent Vitals/Pain Pain Assessment Pain Assessment: Faces Faces Pain Scale: Hurts little more Pain Location: throat Pain Descriptors / Indicators: Sore Pain Intervention(s): Monitored during session     Hand Dominance Right   Extremity/Trunk Assessment Upper Extremity Assessment Upper Extremity Assessment: Overall WFL for tasks assessed   Lower Extremity Assessment Lower Extremity Assessment: Defer to PT evaluation   Cervical / Trunk Assessment Cervical / Trunk Assessment: Neck Surgery   Communication Communication Communication: No difficulties   Cognition Arousal/Alertness: Awake/alert Behavior During Therapy: WFL for tasks assessed/performed Overall Cognitive Status: Within Functional Limits for tasks assessed                                       General Comments       Exercises     Shoulder Instructions      Home Living Family/patient expects to be discharged to::  Private residence Living Arrangements: Spouse/significant other Available Help at Discharge: Family;Available 24 hours/day Type of Home: House Home Access: Stairs to enter [employer] of Steps: 2 Entrance Stairs-Rails: None Home Layout: One level               Home Equipment: Cane - single point          Prior Functioning/Environment Prior Level of Function : Independent/Modified Independent;Driving                        OT Problem List:        OT Treatment/Interventions:      OT Goals(Current goals can be found in the care plan section)    OT Frequency:      Co-evaluation              AM-PAC OT "6 Clicks" Daily Activity     Outcome Measure Help from another person eating meals?: None Help from another person taking care of  personal grooming?: None Help from another person toileting, which includes using toliet, bedpan, or urinal?: None Help from another person bathing (including washing, rinsing, drying)?: None Help from another person to put on and taking off regular upper body clothing?: None Help from another person to put on and taking off regular lower body clothing?: None 6 Click Score: 24   End of Session    Activity Tolerance: Patient tolerated treatment well Patient left: in chair;with call bell/phone within reach  OT Visit Diagnosis: Pain                Time: 0822-0849 OT Time Calculation (min): 27 min Charges:  OT General Charges $OT Visit: 1 Visit OT Evaluation $OT Eval Low Complexity: 1 Low OT Treatments $Self Care/Home Management : 8-22 mins  Cleta Alberts, OTR/L Acute Rehabilitation Services Office: [phone]  [name] 09/25/2022, 9:18 AM

## 2022-09-26 ENCOUNTER — Encounter (HOSPITAL_COMMUNITY): Payer: Self-pay | Admitting: Neurological Surgery

## 2022-10-03 ENCOUNTER — Ambulatory Visit: Payer: Self-pay | Admitting: General Surgery

## 2022-10-03 DIAGNOSIS — K4090 Unilateral inguinal hernia, without obstruction or gangrene, not specified as recurrent: Secondary | ICD-10-CM | POA: Diagnosis not present

## 2022-10-03 NOTE — H&P (View-Only) (Signed)
Chief Complaint: New Consultation (Inguinal hernia)       History of Present Illness: Peter Drake is a 76 y.o. male who is seen today as an office consultation at the request of Dr. Lowanda Foster for evaluation of New Consultation (Inguinal hernia) .   Patient is a 76 year old male who comes in secondary to a right inguinal hernia. Patient has recent history of a recent ACDF, previous left open inguinal hernia.   He states over the last couple of months he has had some pain discomfort as well as a bulge in the right inguinal area.  He states he recently noticed this while playing golf prior to Thanksgiving.  Patient states that this was very uncomfortable.  He states he usually walks approximate 1 to 1/2 miles a day.  He states that recently he is unable to do this secondary to pain discomfort.   He does state that he has not had any signs or symptoms of incarceration or strangulation.   Thank you     Review of Systems: A complete review of systems was obtained from the patient.  I have reviewed this information and discussed as appropriate with the patient.  See HPI as well for other ROS.   Review of Systems  Constitutional:  Negative for fever.  HENT:  Negative for congestion.   Eyes:  Negative for blurred vision.  Respiratory:  Negative for cough, shortness of breath and wheezing.   Cardiovascular:  Negative for chest pain and palpitations.  Gastrointestinal:  Negative for heartburn.  Genitourinary:  Negative for dysuria.  Musculoskeletal:  Negative for myalgias.  Skin:  Negative for rash.  Neurological:  Negative for dizziness and headaches.  Psychiatric/Behavioral:  Negative for depression and suicidal ideas.   All other systems reviewed and are negative.       Medical History: Past Medical History Past Medical History: Diagnosis Date  Arrhythmia    Arthritis    GERD (gastroesophageal reflux disease)    History of cancer    Hyperlipidemia        There is no problem  list on file for this patient.     Past Surgical History Past Surgical History: Procedure Laterality Date  ARTHROPLASTY HIP TOTAL Left    ARTHROPLASTY TOTAL SHOULDER Bilateral    HERNIA REPAIR          Allergies Allergies Allergen Reactions  Penicillins Hives and Other (See Comments)     Did it involve swelling of the face/tongue/throat, SOB, or low BP? Unknown  Did it involve sudden or severe rash/hives, skin peeling, or any reaction on the inside of your mouth or nose? Unknown  Did you need to seek medical attention at a hospital or doctor's office? Unknown  When did it last happen?      Childhood allergy  If all above answers are "NO", may proceed with cephalosporin use.  Patient tolerated ancef administration without adverse reaction.      Current Outpatient Medications on File Prior to Visit Medication Sig Dispense Refill  pantoprazole (PROTONIX) 20 MG DR tablet Take 20 mg by mouth once daily      simvastatin (ZOCOR) 20 MG tablet TAKE ONE TABLET BY MOUTH IN THE EVENING FOR CHOLESTEROL      tamsulosin (FLOMAX) 0.4 mg capsule Take 1 capsule by mouth 2 (two) times daily      tiZANidine (ZANAFLEX) 4 MG tablet Take by mouth      traZODone (DESYREL) 50 MG tablet TAKE 1 TO 2 TABLETS BY MOUTH AT BEDTIME  FOR SLEEP disturbance.       No current facility-administered medications on file prior to visit.     Family History Family History Problem Relation Age of Onset  Coronary Artery Disease (Blocked arteries around heart) Mother    Colon cancer Sister        Social History   Tobacco Use Smoking Status Former  Types: Cigarettes Smokeless Tobacco Never     Social History Social History    Socioeconomic History  Marital status: Married Tobacco Use  Smoking status: Former     Types: Cigarettes  Smokeless tobacco: Never Vaping Use  Vaping Use: Never used Substance and Sexual Activity  Alcohol use: Yes  Drug use: Never      Objective:     Vitals:    10/03/22 0835 BP: (!) 160/90 Pulse: 82 Temp: 36.9 C (98.5 F) SpO2: 97% Weight: 71.4 kg (157 lb 6.4 oz) Height: 175.3 cm ('5\' 9"'$ )   Body mass index is 23.24 kg/m. Physical Exam Constitutional:      Appearance: Normal appearance.  HENT:     Head: Normocephalic and atraumatic.     Nose: Nose normal. No congestion.     Mouth/Throat:     Mouth: Mucous membranes are moist.     Pharynx: Oropharynx is clear.  Eyes:     Pupils: Pupils are equal, round, and reactive to light.  Cardiovascular:     Rate and Rhythm: Normal rate and regular rhythm.     Pulses: Normal pulses.     Heart sounds: Normal heart sounds. No murmur heard.    No friction rub. No gallop.  Pulmonary:     Effort: Pulmonary effort is normal. No respiratory distress.     Breath sounds: Normal breath sounds. No stridor. No wheezing, rhonchi or rales.  Abdominal:     General: Abdomen is flat.     Hernia: A hernia is present. Hernia is present in the right inguinal area.  Musculoskeletal:        General: Normal range of motion.     Cervical back: Normal range of motion.  Skin:    General: Skin is warm and dry.  Neurological:     General: No focal deficit present.     Mental Status: He is alert and oriented to person, place, and time.  Psychiatric:        Mood and Affect: Mood normal.        Thought Content: Thought content normal.          Assessment and Plan: Diagnoses and all orders for this visit:   Unilateral inguinal hernia without obstruction or gangrene, recurrence not specified     Peter Drake is a 76 y.o. male    1.  We will proceed to the OR for a laparoscopic right inguinal hernia repair with mesh. 2. All risks and benefits were discussed with the patient, to generally include infection, bleeding, damage to surrounding structures, acute and chronic nerve pain, and recurrence. Alternatives were offered and described.  All questions were answered and the patient voiced understanding of the  procedure and wishes to proceed at this point.             No follow-ups on file.   Ralene Ok, MD, [facility], [place] [facility]

## 2022-10-03 NOTE — H&P (Signed)
Chief Complaint: New Consultation (Inguinal hernia)       History of Present Illness: Peter Drake is a 76 y.o. male who is seen today as an office consultation at the request of Dr. Lowanda Foster for evaluation of New Consultation (Inguinal hernia) .   Patient is a 76 year old male who comes in secondary to a right inguinal hernia. Patient has recent history of a recent ACDF, previous left open inguinal hernia.   He states over the last couple of months he has had some pain discomfort as well as a bulge in the right inguinal area.  He states he recently noticed this while playing golf prior to Thanksgiving.  Patient states that this was very uncomfortable.  He states he usually walks approximate 1 to 1/2 miles a day.  He states that recently he is unable to do this secondary to pain discomfort.   He does state that he has not had any signs or symptoms of incarceration or strangulation.   Thank you     Review of Systems: A complete review of systems was obtained from the patient.  I have reviewed this information and discussed as appropriate with the patient.  See HPI as well for other ROS.   Review of Systems  Constitutional:  Negative for fever.  HENT:  Negative for congestion.   Eyes:  Negative for blurred vision.  Respiratory:  Negative for cough, shortness of breath and wheezing.   Cardiovascular:  Negative for chest pain and palpitations.  Gastrointestinal:  Negative for heartburn.  Genitourinary:  Negative for dysuria.  Musculoskeletal:  Negative for myalgias.  Skin:  Negative for rash.  Neurological:  Negative for dizziness and headaches.  Psychiatric/Behavioral:  Negative for depression and suicidal ideas.   All other systems reviewed and are negative.       Medical History: Past Medical History Past Medical History: Diagnosis Date  Arrhythmia    Arthritis    GERD (gastroesophageal reflux disease)    History of cancer    Hyperlipidemia        There is no problem  list on file for this patient.     Past Surgical History Past Surgical History: Procedure Laterality Date  ARTHROPLASTY HIP TOTAL Left    ARTHROPLASTY TOTAL SHOULDER Bilateral    HERNIA REPAIR          Allergies Allergies Allergen Reactions  Penicillins Hives and Other (See Comments)     Did it involve swelling of the face/tongue/throat, SOB, or low BP? Unknown  Did it involve sudden or severe rash/hives, skin peeling, or any reaction on the inside of your mouth or nose? Unknown  Did you need to seek medical attention at a hospital or doctor's office? Unknown  When did it last happen?      Childhood allergy  If all above answers are "NO", may proceed with cephalosporin use.  Patient tolerated ancef administration without adverse reaction.      Current Outpatient Medications on File Prior to Visit Medication Sig Dispense Refill  pantoprazole (PROTONIX) 20 MG DR tablet Take 20 mg by mouth once daily      simvastatin (ZOCOR) 20 MG tablet TAKE ONE TABLET BY MOUTH IN THE EVENING FOR CHOLESTEROL      tamsulosin (FLOMAX) 0.4 mg capsule Take 1 capsule by mouth 2 (two) times daily      tiZANidine (ZANAFLEX) 4 MG tablet Take by mouth      traZODone (DESYREL) 50 MG tablet TAKE 1 TO 2 TABLETS BY MOUTH AT BEDTIME  FOR SLEEP disturbance.       No current facility-administered medications on file prior to visit.     Family History Family History Problem Relation Age of Onset  Coronary Artery Disease (Blocked arteries around heart) Mother    Colon cancer Sister        Social History   Tobacco Use Smoking Status Former  Types: Cigarettes Smokeless Tobacco Never     Social History Social History    Socioeconomic History  Marital status: Married Tobacco Use  Smoking status: Former     Types: Cigarettes  Smokeless tobacco: Never Vaping Use  Vaping Use: Never used Substance and Sexual Activity  Alcohol use: Yes  Drug use: Never      Objective:     Vitals:    10/03/22 0835 BP: (!) 160/90 Pulse: 82 Temp: 36.9 C (98.5 F) SpO2: 97% Weight: 71.4 kg (157 lb 6.4 oz) Height: 175.3 cm ('5\' 9"'$ )   Body mass index is 23.24 kg/m. Physical Exam Constitutional:      Appearance: Normal appearance.  HENT:     Head: Normocephalic and atraumatic.     Nose: Nose normal. No congestion.     Mouth/Throat:     Mouth: Mucous membranes are moist.     Pharynx: Oropharynx is clear.  Eyes:     Pupils: Pupils are equal, round, and reactive to light.  Cardiovascular:     Rate and Rhythm: Normal rate and regular rhythm.     Pulses: Normal pulses.     Heart sounds: Normal heart sounds. No murmur heard.    No friction rub. No gallop.  Pulmonary:     Effort: Pulmonary effort is normal. No respiratory distress.     Breath sounds: Normal breath sounds. No stridor. No wheezing, rhonchi or rales.  Abdominal:     General: Abdomen is flat.     Hernia: A hernia is present. Hernia is present in the right inguinal area.  Musculoskeletal:        General: Normal range of motion.     Cervical back: Normal range of motion.  Skin:    General: Skin is warm and dry.  Neurological:     General: No focal deficit present.     Mental Status: He is alert and oriented to person, place, and time.  Psychiatric:        Mood and Affect: Mood normal.        Thought Content: Thought content normal.          Assessment and Plan: Diagnoses and all orders for this visit:   Unilateral inguinal hernia without obstruction or gangrene, recurrence not specified     Peter Drake is a 76 y.o. male    1.  We will proceed to the OR for a laparoscopic right inguinal hernia repair with mesh. 2. All risks and benefits were discussed with the patient, to generally include infection, bleeding, damage to surrounding structures, acute and chronic nerve pain, and recurrence. Alternatives were offered and described.  All questions were answered and the patient voiced understanding of the  procedure and wishes to proceed at this point.             No follow-ups on file.   Ralene Ok, MD, [facility], [place] [facility]

## 2022-10-08 DIAGNOSIS — Z96642 Presence of left artificial hip joint: Secondary | ICD-10-CM | POA: Diagnosis not present

## 2022-10-08 DIAGNOSIS — M25552 Pain in left hip: Secondary | ICD-10-CM | POA: Diagnosis not present

## 2022-10-09 DIAGNOSIS — N138 Other obstructive and reflux uropathy: Secondary | ICD-10-CM | POA: Diagnosis not present

## 2022-10-09 DIAGNOSIS — N401 Enlarged prostate with lower urinary tract symptoms: Secondary | ICD-10-CM | POA: Diagnosis not present

## 2022-10-14 ENCOUNTER — Ambulatory Visit (HOSPITAL_COMMUNITY): Payer: Medicare PPO | Attending: Cardiovascular Disease

## 2022-10-14 DIAGNOSIS — R9431 Abnormal electrocardiogram [ECG] [EKG]: Secondary | ICD-10-CM | POA: Diagnosis not present

## 2022-10-14 LAB — ECHOCARDIOGRAM COMPLETE
Area-P 1/2: 2.99 cm2
S' Lateral: 2.6 cm

## 2022-10-27 ENCOUNTER — Other Ambulatory Visit: Payer: Self-pay

## 2022-10-27 ENCOUNTER — Encounter (HOSPITAL_COMMUNITY): Payer: Self-pay | Admitting: General Surgery

## 2022-10-27 NOTE — Progress Notes (Signed)
PCP - Dr Gaynelle Arabian Cardiologist - Dr Sherren Mocha Urology - Dr Tresa Endo  Chest x-ray - n/a EKG - 09/22/22 Stress Test - n/a ECHO - 10/14/22 Cardiac Cath - n/a  ICD Pacemaker/Loop - n/a  Sleep Study -  Yes CPAP - borderline no CPAP  ERAS: Clear liquids til 11:45 AM DOS  Anesthesia review: Yes  STOP now taking any Aspirin (unless otherwise instructed by your surgeon), Aleve, Naproxen, Ibuprofen, Motrin, Advil, Goody's, BC's, all herbal medications, fish oil, and all vitamins.   Coronavirus Screening Do you have any of the following symptoms:  Cough yes/no: No Fever (>100.30F)  yes/no: No Runny nose yes/no: No Sore throat yes/no: No Difficulty breathing/shortness of breath  yes/no: No  Have you traveled in the last 14 days and where? yes/no: No  Patient verbalized understanding of instructions that were given via phone.

## 2022-10-27 NOTE — Anesthesia Preprocedure Evaluation (Signed)
Anesthesia Evaluation  Patient identified by MRN, date of birth, ID band Patient awake    Reviewed: Allergy & Precautions, NPO status , Patient's Chart, lab work & pertinent test results  History of Anesthesia Complications Negative for: history of anesthetic complications  Airway Mallampati: I  TM Distance: >3 FB Neck ROM: Limited    Dental  (+) Teeth Intact, Dental Advisory Given   Pulmonary sleep apnea , former smoker   breath sounds clear to auscultation       Cardiovascular negative cardio ROS  Rhythm:Regular     Neuro/Psych  Headaches PSYCHIATRIC DISORDERS Anxiety      Neuromuscular disease    GI/Hepatic Neg liver ROS,GERD  ,,  Endo/Other  negative endocrine ROS    Renal/GU negative Renal ROS     Musculoskeletal  (+) Arthritis ,    Abdominal   Peds  Hematology negative hematology ROS (+)   Anesthesia Other Findings   Reproductive/Obstetrics                             Anesthesia Physical Anesthesia Plan  ASA: 2  Anesthesia Plan: General   Post-op Pain Management: Tylenol PO (pre-op)*   Induction: Intravenous  PONV Risk Score and Plan: 2 and Ondansetron and Dexamethasone  Airway Management Planned: Oral ETT  Additional Equipment: None  Intra-op Plan:   Post-operative Plan: Extubation in OR  Informed Consent: I have reviewed the patients History and Physical, chart, labs and discussed the procedure including the risks, benefits and alternatives for the proposed anesthesia with the patient or authorized representative who has indicated his/her understanding and acceptance.     Dental advisory given  Plan Discussed with: CRNA  Anesthesia Plan Comments: (PAT note written 10/27/2022 by Myra Gianotti, PA-C.  )       Anesthesia Quick Evaluation

## 2022-10-27 NOTE — Progress Notes (Signed)
Anesthesia Chart Review: Peter Drake  Case: [id number] Date/Time: 10/28/22 1430   Procedure: LAPAROSCOPIC RIGHT INGUINAL HERNIA REPAIR WITH MESH (Right)   Anesthesia type: General   Pre-op diagnosis: RIGHT INGUINAL HERNIA   Location: [facility] OR ROOM 10 / [facility]   Surgeons: Ralene Ok, MD       DISCUSSION: Patient is a 77 year old male scheduled for the above procedure. S/p C3-6 ACDF 09/24/22.    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   He was evaluated by cardiologist Dr. Burt Knack on 09/22/22 prior to ACDF due to abnormal EKG , possible age indeterminate inferior infarct but without clinical history of MI, angina, or HF. He was cleared with low CV risk for that procedure with plans for future echocardiogram. This was done on 10/14/22 and showed normal LVEF 60-65%, no regional wall motion abnormalities, grade 1 diastolic dysfunction, trivial MR, mild AR.  He had radiation for left vocal cord cancer around 2009. He denied limitation of mouth opening. He does have mild hoarseness. Reports dental crowns. Glidescope used electively on 09/24/22 (for C3-6 ACDF) to maintain neurtral neck due to tingling in left arm.   He is a same day work-up, so labs on arrival as indicated.  Anesthesia team to evaluate on the day of surgery.   VS:  BP Readings from Last 3 Encounters:  09/25/22 (!) 136/90  09/22/22 (!) 150/84  09/15/22 (!) 148/85   Pulse Readings from Last 3 Encounters:  09/25/22 72  09/22/22 73  09/15/22 (!) 55     PROVIDERS: Gaynelle Arabian, MD is PCP  Jerrell Belfast, MD is ENT Juanita Craver, MD is GI Luberta Mutter, MD is ophthalmologist Sherren Mocha, MD is cardiologist. Seen for preoperative evaluation on 09/22/22.   LABS: For day of surgery as indicated. Most recent results in [facility] include: Lab Results  Component Value Date   WBC 5.1 09/15/2022   HGB 15.5 09/15/2022   HCT 45.2 09/15/2022   PLT 182 09/15/2022   GLUCOSE 89 09/15/2022   ALT 18 09/15/2022   AST 24 09/15/2022   NA 134 (L) 09/15/2022   K 4.2 09/15/2022   CL 100 09/15/2022   CREATININE 0.73 09/15/2022   BUN 6 (L) 09/15/2022   CO2 27 09/15/2022   TSH 2.001 12/29/2013   INR 1.1 09/15/2022    EKG: EKG 09/22/22: NSR  EKG 09/15/22:  Normal sinus rhythm Left axis deviation Inferior infarct , age undetermined Abnormal ECG When compared with ECG of 14-Jun-2009 08:07, PREVIOUS ECG IS PRESENT Cannot rule out Inferior infarct New since previous tracing Confirmed by Kirk Ruths [phone]) on 09/15/2022 10:59:42 AM     CV: Echo 10/14/22: IMPRESSIONS   1. Left ventricular ejection fraction, by estimation, is 60 to 65%. Left  ventricular ejection fraction by 3D volume is 60 %. The left ventricle has  normal function. The left ventricle has no regional wall motion  abnormalities. Left ventricular diastolic   parameters are consistent with Grade I diastolic dysfunction (impaired  relaxation). The average left ventricular global longitudinal strain is  -20.7 %. The global longitudinal strain is normal.   2. Right ventricular systolic function is normal. The right ventricular  size is normal. Tricuspid regurgitation signal is inadequate for  assessing  PA pressure.   3. The mitral valve is normal in structure. Trivial mitral valve  regurgitation.   4. The aortic valve is tricuspid. There is mild calcification of the  aortic valve. There is mild thickening of the aortic valve. Aortic valve  regurgitation is mild. Aortic valve sclerosis/calcification is present,  without any evidence of aortic  stenosis.   5. Aortic dilatation noted. There is borderline  dilatation of the  ascending aorta, measuring 37 mm.   6. The inferior vena cava is dilated in size with >50% respiratory  variability, suggesting right atrial pressure of 8 mmHg.  - Comparison(s): Compared to prior TTE in 12/2019, there is now mild AR.  Otherwise, no significant change.     US Carotid 12/19/19: Summary:  - Right Carotid: There is no evidence of stenosis in the right ICA.  The extracranial vessels were near-normal with only minimal  wall thickening or plaque.  - Left Carotid: There is no evidence of stenosis in the left ICA.  The extracranial vessels were near-normal with only minimal  wall thickening or plaque.  - Vertebrals:  Bilateral vertebral arteries demonstrate antegrade flow.  - Subclavians: Normal flow hemodynamics were seen in bilateral subclavian arteries.   Past Medical History:  Diagnosis Date   Allergy    pcn   Amaurosis fugax    Anxiety    Arthritis    HX: of OA   BPH (benign prostatic hypertrophy)    Hx: of   Cancer ([place]) 06/23/2008   L vocoal cord sq cell ca   Colorectal polyps 10/2001   Dyslipidemia    Early cataracts, bilateral    Hx: of   GERD (gastroesophageal reflux disease)    H/O Mohs micrographic surgery for skin cancer    Headache    has aura's associated with migraines   History of radiation therapy 08/04/08-09/20/08   larynx   Hyperlipidemia    PVC (premature ventricular contraction)    > 20 years, occasional PVCs on monitor, no specific treatment recommended by cardiology then (as of 09/16/22)ported   Radiation 08/04/08-09/20/08   Larynx   33 treatments   Sleep apnea    borderline no cpap needed    Past Surgical History:  Procedure Laterality Date   ANTERIOR CERVICAL DECOMP/DISCECTOMY FUSION N/A 09/24/2022   Procedure: ANTERIOR CERVICAL DISCECTOMY FUSION CERVICAL THREE - CERVICAL FOUR, CERVICAL FOUR - CERVICAL FIVE, CERVICAL FIVE - CERVICAL SIX;  Surgeon: Eustace Moore, MD;  Location: [facility];  Service: Neurosurgery;   Laterality: N/A;   basal cell excisions     by dr. hall   CERVICAL SPINE SURGERY     COLONOSCOPY W/ BIOPSIES AND POLYPECTOMY     Hx: of   COLONOSCOPY WITH PROPOFOL N/A 12/23/2018   Procedure: COLONOSCOPY WITH PROPOFOL;  Surgeon: Juanita Craver, MD;  Location: [facility];  Service: Endoscopy;  Laterality: N/A;   ESOPHAGOGASTRODUODENOSCOPY (EGD) WITH PROPOFOL N/A 12/23/2018   Procedure: ESOPHAGOGASTRODUODENOSCOPY (EGD) WITH PROPOFOL;  Surgeon: Juanita Craver, MD;  Location: [facility];  Service: Endoscopy;  Laterality: N/A;   EYE SURGERY     bil with implant   FINGER SURGERY     right fifth   INGUINAL HERNIA REPAIR   03/2005   left    left knee arthoscopy     x 2   MOHS SURGERY     x3   NASAL SINUS SURGERY  1990's   polyp removal     squamous cell  33 radiation treatments vocal cord  POLYPECTOMY  12/23/2018   Procedure: POLYPECTOMY;  Surgeon: Juanita Craver, MD;  Location: [facility];  Service: Endoscopy;;   RADIAL KERATOTOMY     20 years ago/ bi lateral   TONSILLECTOMY     as a child   TOTAL HIP ARTHROPLASTY Left 05/06/2022   Procedure: TOTAL HIP ARTHROPLASTY ANTERIOR APPROACH;  Surgeon: Paralee Cancel, MD;  Location: [facility];  Service: Orthopedics;  Laterality: Left;   TOTAL SHOULDER ARTHROPLASTY Right 11/03/2013   DR SUPPLE   TOTAL SHOULDER ARTHROPLASTY Right 11/02/2013   Procedure: RIGHT TOTAL SHOULDER ARTHROPLASTY;  Surgeon: Marin Shutter, MD;  Location: [facility];  Service: Orthopedics;  Laterality: Right;   TOTAL SHOULDER ARTHROPLASTY Left 10/04/2020   Procedure: TOTAL SHOULDER ARTHROPLASTY;  Surgeon: Justice Britain, MD;  Location: [facility];  Service: Orthopedics;  Laterality: Left;  149mn    MEDICATIONS: No current facility-administered medications for this encounter.    acetaminophen (TYLENOL) 500 MG tablet   docusate sodium (COLACE) 100 MG capsule   fluticasone (FLONASE) 50 MCG/ACT nasal spray   gabapentin (NEURONTIN) 100 MG capsule   pantoprazole (PROTONIX) 40 MG tablet    simvastatin (ZOCOR) 20 MG tablet   Tamsulosin HCl (FLOMAX) 0.4 MG CAPS   tiZANidine (ZANAFLEX) 4 MG tablet   traZODone (DESYREL) 50 MG tablet   HYDROcodone-acetaminophen (NORCO/VICODIN) 5-325 MG tablet    AMyra Gianotti PA-C Surgical Short Stay/Anesthesiology MCor[facility] Of Southwest LouisianaPhone [phone] Colon Care IncPhone [phone]/05/2023 12:25 PM

## 2022-10-28 ENCOUNTER — Other Ambulatory Visit: Payer: Self-pay

## 2022-10-28 ENCOUNTER — Ambulatory Visit (HOSPITAL_COMMUNITY): Payer: Medicare PPO | Admitting: Vascular Surgery

## 2022-10-28 ENCOUNTER — Encounter (HOSPITAL_COMMUNITY)
Admission: RE | Disposition: A | Discharge status: Home / Self Care | Payer: Self-pay | Source: Home / Self Care | Attending: General Surgery

## 2022-10-28 ENCOUNTER — Encounter (HOSPITAL_COMMUNITY): Payer: Self-pay | Admitting: General Surgery

## 2022-10-28 ENCOUNTER — Ambulatory Visit (HOSPITAL_COMMUNITY)
Admission: RE | Admit: 2022-10-28 | Discharge: 2022-10-28 | Disposition: A | Discharge status: Home / Self Care | Payer: Medicare PPO | Attending: General Surgery | Admitting: General Surgery

## 2022-10-28 ENCOUNTER — Ambulatory Visit (HOSPITAL_BASED_OUTPATIENT_CLINIC_OR_DEPARTMENT_OTHER): Payer: Medicare PPO | Admitting: Vascular Surgery

## 2022-10-28 DIAGNOSIS — K4090 Unilateral inguinal hernia, without obstruction or gangrene, not specified as recurrent: Secondary | ICD-10-CM

## 2022-10-28 DIAGNOSIS — N400 Enlarged prostate without lower urinary tract symptoms: Secondary | ICD-10-CM | POA: Insufficient documentation

## 2022-10-28 DIAGNOSIS — Z981 Arthrodesis status: Secondary | ICD-10-CM | POA: Diagnosis not present

## 2022-10-28 DIAGNOSIS — G4730 Sleep apnea, unspecified: Secondary | ICD-10-CM | POA: Insufficient documentation

## 2022-10-28 DIAGNOSIS — Z85828 Personal history of other malignant neoplasm of skin: Secondary | ICD-10-CM | POA: Diagnosis not present

## 2022-10-28 DIAGNOSIS — K219 Gastro-esophageal reflux disease without esophagitis: Secondary | ICD-10-CM | POA: Insufficient documentation

## 2022-10-28 DIAGNOSIS — I700 Atherosclerosis of aorta: Secondary | ICD-10-CM | POA: Diagnosis not present

## 2022-10-28 DIAGNOSIS — Z923 Personal history of irradiation: Secondary | ICD-10-CM | POA: Diagnosis not present

## 2022-10-28 DIAGNOSIS — F419 Anxiety disorder, unspecified: Secondary | ICD-10-CM | POA: Diagnosis not present

## 2022-10-28 DIAGNOSIS — I351 Nonrheumatic aortic (valve) insufficiency: Secondary | ICD-10-CM | POA: Diagnosis not present

## 2022-10-28 DIAGNOSIS — Z87891 Personal history of nicotine dependence: Secondary | ICD-10-CM | POA: Insufficient documentation

## 2022-10-28 DIAGNOSIS — E785 Hyperlipidemia, unspecified: Secondary | ICD-10-CM | POA: Insufficient documentation

## 2022-10-28 DIAGNOSIS — M1990 Unspecified osteoarthritis, unspecified site: Secondary | ICD-10-CM | POA: Insufficient documentation

## 2022-10-28 HISTORY — DX: Unspecified hearing loss, unspecified ear: H91.90

## 2022-10-28 HISTORY — PX: INGUINAL HERNIA REPAIR: SHX194

## 2022-10-28 HISTORY — DX: Myoneural disorder, unspecified: G70.9

## 2022-10-28 LAB — CBC
HCT: 44.7 % (ref 39.0–52.0)
Hemoglobin: 15.5 g/dL (ref 13.0–17.0)
MCH: 31.4 pg (ref 26.0–34.0)
MCHC: 34.7 g/dL (ref 30.0–36.0)
MCV: 90.5 fL (ref 80.0–100.0)
Platelets: 177 10*3/uL (ref 150–400)
RBC: 4.94 MIL/uL (ref 4.22–5.81)
RDW: 12.3 % (ref 11.5–15.5)
WBC: 6.1 10*3/uL (ref 4.0–10.5)
nRBC: 0 % (ref 0.0–0.2)

## 2022-10-28 SURGERY — REPAIR, HERNIA, INGUINAL, LAPAROSCOPIC
Anesthesia: General | Site: Inguinal | Laterality: Right

## 2022-10-28 MED ORDER — ONDANSETRON HCL 4 MG/2ML IJ SOLN
INTRAMUSCULAR | Status: DC | PRN
  Administered 2022-10-28: 4 mg via INTRAVENOUS

## 2022-10-28 MED ORDER — CHLORHEXIDINE GLUCONATE CLOTH 2 % EX PADS: 6.0000 | MEDICATED_PAD | Freq: Once | CUTANEOUS | Status: DC

## 2022-10-28 MED ORDER — ACETAMINOPHEN 500 MG PO TABS
1000.0000 mg | ORAL_TABLET | ORAL | Status: AC
  Administered 2022-10-28: 1000 mg via ORAL
  Filled 2022-10-28: qty 2

## 2022-10-28 MED ORDER — EPHEDRINE SULFATE-NACL 50-0.9 MG/10ML-% IV SOSY
PREFILLED_SYRINGE | INTRAVENOUS | Status: DC | PRN
  Administered 2022-10-28: 10 mg via INTRAVENOUS

## 2022-10-28 MED ORDER — FENTANYL CITRATE (PF) 250 MCG/5ML IJ SOLN
INTRAMUSCULAR | Status: AC
  Filled 2022-10-28: qty 5

## 2022-10-28 MED ORDER — PROPOFOL 10 MG/ML IV BOLUS
INTRAVENOUS | Status: AC
  Filled 2022-10-28: qty 20

## 2022-10-28 MED ORDER — TRAMADOL HCL 50 MG PO TABS: 50.0000 mg | ORAL_TABLET | Freq: Four times a day (QID) | ORAL | 0 refills | Status: DC | PRN

## 2022-10-28 MED ORDER — PROPOFOL 1000 MG/100ML IV EMUL
INTRAVENOUS | Status: AC
  Filled 2022-10-28: qty 100

## 2022-10-28 MED ORDER — FENTANYL CITRATE (PF) 250 MCG/5ML IJ SOLN
INTRAMUSCULAR | Status: DC | PRN
  Administered 2022-10-28 (×2): 100 ug via INTRAVENOUS
  Administered 2022-10-28: 50 ug via INTRAVENOUS

## 2022-10-28 MED ORDER — LIDOCAINE 2% (20 MG/ML) 5 ML SYRINGE
INTRAMUSCULAR | Status: DC | PRN
  Administered 2022-10-28: 60 mg via INTRAVENOUS

## 2022-10-28 MED ORDER — CHLORHEXIDINE GLUCONATE 0.12 % MT SOLN
15.0000 mL | Freq: Once | OROMUCOSAL | Status: AC
  Administered 2022-10-28: 15 mL via OROMUCOSAL
  Filled 2022-10-28: qty 15

## 2022-10-28 MED ORDER — CIPROFLOXACIN IN D5W 400 MG/200ML IV SOLN
400.0000 mg | INTRAVENOUS | Status: AC
  Administered 2022-10-28: 400 mg via INTRAVENOUS
  Filled 2022-10-28: qty 200

## 2022-10-28 MED ORDER — ROCURONIUM BROMIDE 10 MG/ML (PF) SYRINGE
PREFILLED_SYRINGE | INTRAVENOUS | Status: DC | PRN
  Administered 2022-10-28: 60 mg via INTRAVENOUS

## 2022-10-28 MED ORDER — DEXAMETHASONE SODIUM PHOSPHATE 10 MG/ML IJ SOLN
INTRAMUSCULAR | Status: DC | PRN
  Administered 2022-10-28: 4 mg via INTRAVENOUS

## 2022-10-28 MED ORDER — BUPIVACAINE HCL (PF) 0.5 % IJ SOLN
INTRAMUSCULAR | Status: AC
  Filled 2022-10-28: qty 30

## 2022-10-28 MED ORDER — LIDOCAINE 2% (20 MG/ML) 5 ML SYRINGE
INTRAMUSCULAR | Status: AC
  Filled 2022-10-28: qty 5

## 2022-10-28 MED ORDER — BUPIVACAINE HCL (PF) 0.5 % IJ SOLN
INTRAMUSCULAR | Status: DC | PRN
  Administered 2022-10-28: 8 mL

## 2022-10-28 MED ORDER — EPHEDRINE 5 MG/ML INJ
INTRAVENOUS | Status: AC
  Filled 2022-10-28: qty 5

## 2022-10-28 MED ORDER — ROCURONIUM BROMIDE 10 MG/ML (PF) SYRINGE
PREFILLED_SYRINGE | INTRAVENOUS | Status: AC
  Filled 2022-10-28: qty 10

## 2022-10-28 MED ORDER — PROPOFOL 10 MG/ML IV BOLUS
INTRAVENOUS | Status: DC | PRN
  Administered 2022-10-28: 120 mg via INTRAVENOUS

## 2022-10-28 MED ORDER — ORAL CARE MOUTH RINSE: 15.0000 mL | Freq: Once | OROMUCOSAL | Status: AC

## 2022-10-28 MED ORDER — DEXAMETHASONE SODIUM PHOSPHATE 10 MG/ML IJ SOLN
INTRAMUSCULAR | Status: AC
  Filled 2022-10-28: qty 1

## 2022-10-28 MED ORDER — 0.9 % SODIUM CHLORIDE (POUR BTL) OPTIME
TOPICAL | Status: DC | PRN
  Administered 2022-10-28: 1000 mL

## 2022-10-28 MED ORDER — ENSURE PRE-SURGERY PO LIQD: 296.0000 mL | Freq: Once | ORAL | Status: DC

## 2022-10-28 MED ORDER — SUGAMMADEX SODIUM 200 MG/2ML IV SOLN
INTRAVENOUS | Status: DC | PRN
  Administered 2022-10-28: 200 mg via INTRAVENOUS

## 2022-10-28 MED ORDER — ONDANSETRON HCL 4 MG/2ML IJ SOLN
INTRAMUSCULAR | Status: AC
  Filled 2022-10-28: qty 2

## 2022-10-28 MED ORDER — LACTATED RINGERS IV SOLN: INTRAVENOUS | Status: DC

## 2022-10-28 SURGICAL SUPPLY — 44 items
BAG COUNTER SPONGE SURGICOUNT (BAG) ×1 IMPLANT
CANISTER SUCT 3000ML PPV (MISCELLANEOUS) IMPLANT
COVER SURGICAL LIGHT HANDLE (MISCELLANEOUS) ×1 IMPLANT
DERMABOND ADVANCED .7 DNX12 (GAUZE/BANDAGES/DRESSINGS) ×1 IMPLANT
DERMABOND ADVANCED .7 DNX6 (GAUZE/BANDAGES/DRESSINGS) IMPLANT
DISSECTOR BLUNT TIP ENDO 5MM (MISCELLANEOUS) IMPLANT
ELECT REM PT RETURN 9FT ADLT (ELECTROSURGICAL) ×1
ELECTRODE REM PT RTRN 9FT ADLT (ELECTROSURGICAL) ×1 IMPLANT
GLOVE BIO SURGEON STRL SZ7.5 (GLOVE) ×1 IMPLANT
GLOVE SURG SYN 7.5  E (GLOVE) ×1
GLOVE SURG SYN 7.5 E (GLOVE) ×1 IMPLANT
GLOVE SURG SYN 7.5 PF PI (GLOVE) ×1 IMPLANT
GOWN STRL REUS W/ TWL LRG LVL3 (GOWN DISPOSABLE) ×2 IMPLANT
GOWN STRL REUS W/ TWL XL LVL3 (GOWN DISPOSABLE) ×1 IMPLANT
GOWN STRL REUS W/TWL LRG LVL3 (GOWN DISPOSABLE) ×2
GOWN STRL REUS W/TWL XL LVL3 (GOWN DISPOSABLE) ×1
KIT BASIN OR (CUSTOM PROCEDURE TRAY) ×1 IMPLANT
KIT TURNOVER KIT B (KITS) ×1 IMPLANT
MESH 3DMAX 5X7 RT XLRG (Mesh General) IMPLANT
NDL INSUFFLATION 14GA 120MM (NEEDLE) IMPLANT
NEEDLE INSUFFLATION 14GA 120MM (NEEDLE) ×1 IMPLANT
NS IRRIG 1000ML POUR BTL (IV SOLUTION) ×1 IMPLANT
PAD ARMBOARD 7.5X6 YLW CONV (MISCELLANEOUS) ×2 IMPLANT
RELOAD STAPLE 4.0 BLU F/HERNIA (INSTRUMENTS) IMPLANT
RELOAD STAPLE 4.8 BLK F/HERNIA (STAPLE) IMPLANT
RELOAD STAPLE HERNIA 4.0 BLUE (INSTRUMENTS) IMPLANT
RELOAD STAPLE HERNIA 4.8 BLK (STAPLE) IMPLANT
SCISSORS LAP 5X35 DISP (ENDOMECHANICALS) ×1 IMPLANT
SET IRRIG TUBING LAPAROSCOPIC (IRRIGATION / IRRIGATOR) IMPLANT
SET TUBE SMOKE EVAC HIGH FLOW (TUBING) ×1 IMPLANT
STAPLER HERNIA 12 8.5 360D (INSTRUMENTS) IMPLANT
SUT MNCRL AB 4-0 PS2 18 (SUTURE) ×1 IMPLANT
SUT VIC AB 1 CT1 27 (SUTURE)
SUT VIC AB 1 CT1 27XBRD ANBCTR (SUTURE) IMPLANT
SYRINGE TOOMEY DISP (SYRINGE) ×1 IMPLANT
TOWEL GREEN STERILE (TOWEL DISPOSABLE) ×1 IMPLANT
TOWEL GREEN STERILE FF (TOWEL DISPOSABLE) ×1 IMPLANT
TRAY FOLEY W/BAG SLVR 14FR (SET/KITS/TRAYS/PACK) ×1 IMPLANT
TRAY LAPAROSCOPIC MC (CUSTOM PROCEDURE TRAY) ×1 IMPLANT
TROCAR OPTICAL SHORT 5MM (TROCAR) ×1 IMPLANT
TROCAR OPTICAL SLV SHORT 5MM (TROCAR) ×1 IMPLANT
TROCAR XCEL 12X100 BLDLESS (ENDOMECHANICALS) ×1 IMPLANT
WARMER LAPAROSCOPE (MISCELLANEOUS) ×1 IMPLANT
WATER STERILE IRR 1000ML POUR (IV SOLUTION) ×1 IMPLANT

## 2022-10-28 NOTE — Interval H&P Note (Signed)
History and Physical Interval Note:  10/28/2022 2:24 PM  Peter Drake  has presented today for surgery, with the diagnosis of RIGHT INGUINAL HERNIA.  The various methods of treatment have been discussed with the patient and family. After consideration of risks, benefits and other options for treatment, the patient has consented to  Procedure(s): LAPAROSCOPIC RIGHT INGUINAL HERNIA REPAIR WITH MESH (Right) as a surgical intervention.  The patient's history has been reviewed, patient examined, no change in status, stable for surgery.  I have reviewed the patient's chart and labs.  Questions were answered to the patient's satisfaction.     Ralene Ok

## 2022-10-28 NOTE — Transfer of Care (Signed)
Immediate Anesthesia Transfer of Care Note  Patient: Peter Drake  Procedure(s) Performed: LAPAROSCOPIC RIGHT INGUINAL HERNIA REPAIR WITH MESH XL 5X7 3DMAX (Right: Inguinal)  Patient Location: PACU  Anesthesia Type:General  Level of Consciousness: awake, alert , and oriented  Airway & Oxygen Therapy: Patient Spontanous Breathing and Patient connected to nasal cannula oxygen  Post-op Assessment: Report given to RN and Post -op Vital signs reviewed and stable  Post vital signs: Reviewed and stable  Last Vitals:  Vitals Value Taken Time  BP 153/91 10/28/22 1545  Temp    Pulse 76 10/28/22 1546  Resp 12 10/28/22 1546  SpO2 98 % 10/28/22 1546  Vitals shown include unvalidated device data.  Last Pain:  Vitals:   10/28/22 1253  TempSrc:   PainSc: 0-No pain         Complications: No notable events documented.

## 2022-10-28 NOTE — Discharge Instructions (Signed)
[facility] _______[facility] ? ?INGUINAL HERNIA REPAIR: POST OP INSTRUCTIONS ? ?Always review your discharge instruction sheet given to you by the facility where your surgery was performed. ?IF YOU HAVE DISABILITY OR FAMILY LEAVE FORMS, YOU MUST BRING THEM TO THE OFFICE FOR PROCESSING.   ?DO NOT GIVE THEM TO YOUR DOCTOR. ? ?1. A  prescription for pain medication may be given to you upon discharge.  Take your pain medication as prescribed, if needed.  If narcotic pain medicine is not needed, then you may take acetaminophen (Tylenol) or ibuprofen (Advil) as needed. ?2. Take your usually prescribed medications unless otherwise directed. ?If you need a refill on your pain medication, please contact your pharmacy.  They will contact our office to request authorization. Prescriptions will not be filled after 5 pm or on week-ends. ?3. You should follow a light diet the first 24 hours after arrival home, such as soup and crackers, etc.  Be sure to include lots of fluids daily.  Resume your normal diet the day after surgery. ?4.Most patients will experience some swelling and bruising around the umbilicus or in the groin and scrotum.  Ice packs and reclining will help.  Swelling and bruising can take several days to resolve.  ?6. It is common to experience some constipation if taking pain medication after surgery.  Increasing fluid intake and taking a stool softener (such as Colace) will usually help or prevent this problem from occurring.  A mild laxative (Milk of Magnesia or Miralax) should be taken according to package directions if there are no bowel movements after 48 hours. ?7. Unless discharge instructions indicate otherwise, you may remove your bandages 24-48 hours after surgery, and you may shower at that time.  You may have steri-strips (small skin tapes) in place directly over the incision.  These strips should be left on the skin for 7-10 days.  If your surgeon used skin glue on the incision, you may  shower in 24 hours.  The glue will flake off over the next 2-3 weeks.  Any sutures or staples will be removed at the office during your follow-up visit. ?8. ACTIVITIES:  You may resume regular (light) daily activities beginning the next day--such as daily self-care, walking, climbing stairs--gradually increasing activities as tolerated.  You may have sexual intercourse when it is comfortable.  Refrain from any heavy lifting or straining until approved by your doctor. ? ?a.You may drive when you are no longer taking prescription pain medication, you can comfortably wear a seatbelt, and you can safely maneuver your car and apply brakes. ?b.RETURN TO WORK:   ?_____________________________________________ ? ?9.You should see your doctor in the office for a follow-up appointment approximately 2-3 weeks after your surgery.  Make sure that you call for this appointment within a day or two after you arrive home to insure a convenient appointment time. ?10.OTHER INSTRUCTIONS: _________________________ ?   _____________________________________ ? ?WHEN TO CALL YOUR DOCTOR: ?Fever over 101.0 ?Inability to urinate ?Nausea and/or vomiting ?Extreme swelling or bruising ?Continued bleeding from incision. ?Increased pain, redness, or drainage from the incision ? ?The clinic staff is available to answer your questions during regular business hours.  Please don?t hesitate to call and ask to speak to one of the nurses for clinical concerns.  If you have a medical emergency, go to the nearest emergency room or call 911.  A surgeon from [facility] is always on call at the hospital ? ? ?[address], [address], [place], [place]    [address] ? ? [address] [address], [place], [place]   [address] ?[phone] ? [phone] ? FAX [phone] ?Web site: [email or web address] ? ?

## 2022-10-28 NOTE — Progress Notes (Signed)
Permit for consent not corresponding with surgical posting. Dr. Abigail Miyamoto notified. Stated to have consent sheet in pt's.room and he will fill it out when he talks to patient.

## 2022-10-28 NOTE — Op Note (Signed)
10/28/2022  3:32 PM  PATIENT:  Peter Drake  77 y.o. male  PRE-OPERATIVE DIAGNOSIS:  RIGHT INGUINAL HERNIA  POST-OPERATIVE DIAGNOSIS:  RIGHT INDIRECT INGUINAL HERNIA  PROCEDURE:  Procedure(s): LAPAROSCOPIC RIGHT INGUINAL HERNIA REPAIR WITH MESH (Right)  SURGEON:  Surgeon(s) and Role:    * Peter Ok, MD - Primary  ASSISTANTS: Peter Drake, RNFA   ANESTHESIA:   local and general  EBL:  minimal   BLOOD ADMINISTERED:none  DRAINS: none   LOCAL MEDICATIONS USED:  BUPIVICAINE   SPECIMEN:  No Specimen  DISPOSITION OF SPECIMEN:  N/A  COUNTS:  YES  TOURNIQUET:  * No tourniquets in log *  DICTATION: .Dragon Dictation  Counts: reported as correct x 2  Findings:  The patient had a large right indirect hernia and a moderated sized cord lipoma  Indications for procedure:  The patient is a 77 year old male with a right inguinal hernia for several months. Patient complained of symptomatology to his right inguinal area. The patient was taken back for elective inguinal hernia repair.  Details of the procedure: The patient was taken back to the operating room. The patient was placed in supine position with bilateral SCDs in place.  The patient was prepped and draped in the usual sterile fashion.  After appropriate anitbiotics were confirmed, a time-out was confirmed and all facts were verified.  0.25% Marcaine was used to infiltrate the umbilical area. A 11-blade was used to cut down the skin and blunt dissection was used to get the anterior fashion.  The anterior fascia was incised approximately 1 cm and the muscles were retracted laterally. Blunt dissection was then used to create a space in the preperitoneal area. At this time a 10 mm camera was then introduced into the space and advanced the pubic tubercle and a 12 mm trocar was placed over this and insufflation was started.  At this time and space was created from medial to laterally the preperitoneal space.  Cooper's  ligament was initially cleaned off.  The hernia sac was identified in the indirect space. Dissection of the hernia sac and cord structures was undertaken the vas deferens was identified and protected in all parts of the case.    Once the hernia sac was taken down to approximately the umbilicus a Bard 3D Max mesh, size: Peter Drake, was  introduced into the preperitoneal space.  The mesh was brought over to cover the direct and indirect hernia spaces.  This was anchored into place and secured to Cooper's ligament with 4.86m staples from a Coviden hernia stapler. It was anchored to the anterior abdominal wall with 4.8 mm staples. The hernia sac was seen lying posterior to the mesh. There was no staples placed laterally. The insufflation was evacuated and the peritoneum was seen posterior to the mesh. The trochars were removed. The anterior fascia was reapproximated using #1 Vicryl on a UR- 6.  Intra-abdominal air was evacuated and the Veress needle removed. The skin was reapproximated using 4-0 Monocryl subcuticular fashion and Dermabond. The patient was awakened from general anesthesia and taken to recovery in stable condition.    PLAN OF CARE: Discharge form PACU   PATIENT DISPOSITION:  PACU - hemodynamically stable.   Delay start of Pharmacological VTE agent (>24hrs) due to surgical blood loss or risk of bleeding: not applicable

## 2022-10-28 NOTE — Anesthesia Procedure Notes (Signed)
Procedure Name: Intubation Date/Time: 10/28/2022 2:56 PM  Performed by: Janace Litten, CRNAPre-anesthesia Checklist: Patient identified, Emergency Drugs available, Suction available and Patient being monitored Patient Re-evaluated:Patient Re-evaluated prior to induction Oxygen Delivery Method: Circle System Utilized Preoxygenation: Pre-oxygenation with 100% oxygen Induction Type: IV induction Ventilation: Mask ventilation without difficulty Laryngoscope Size: Mac and 4 Grade View: Grade I Tube type: Oral Tube size: 7.5 mm Number of attempts: 1 Airway Equipment and Method: Stylet Placement Confirmation: ETT inserted through vocal cords under direct vision, positive ETCO2 and breath sounds checked- equal and bilateral Secured at: 23 cm Tube secured with: Tape Dental Injury: Teeth and Oropharynx as per pre-operative assessment

## 2022-10-29 ENCOUNTER — Encounter (HOSPITAL_COMMUNITY): Payer: Self-pay | Admitting: General Surgery

## 2022-10-29 NOTE — Anesthesia Postprocedure Evaluation (Signed)
Anesthesia Post Note  Patient: Peter Drake  Procedure(s) Performed: LAPAROSCOPIC RIGHT INGUINAL HERNIA REPAIR WITH MESH XL 5X7 3DMAX (Right: Inguinal)     Patient location during evaluation: PACU Anesthesia Type: General Level of consciousness: awake and alert Pain management: pain level controlled Vital Signs Assessment: post-procedure vital signs reviewed and stable Respiratory status: spontaneous breathing, nonlabored ventilation, respiratory function stable and patient connected to nasal cannula oxygen Cardiovascular status: blood pressure returned to baseline and stable Postop Assessment: no apparent nausea or vomiting Anesthetic complications: no  No notable events documented.  Last Vitals:  Vitals:   10/28/22 1615 10/28/22 1630  BP: 132/84 124/81  Pulse: 67 (!) 58  Resp: 14 10  Temp:  36.5 C  SpO2: 96% 97%    Last Pain:  Vitals:   10/28/22 1630  TempSrc:   PainSc: 2                  Givanni Staron

## 2022-11-06 DIAGNOSIS — M47812 Spondylosis without myelopathy or radiculopathy, cervical region: Secondary | ICD-10-CM | POA: Diagnosis not present

## 2022-11-28 ENCOUNTER — Ambulatory Visit
Admission: RE | Admit: 2022-11-28 | Discharge: 2022-11-28 | Disposition: A | Discharge status: Home / Self Care | Payer: Medicare PPO | Source: Ambulatory Visit | Attending: Family Medicine | Admitting: Family Medicine

## 2022-11-28 ENCOUNTER — Other Ambulatory Visit: Payer: Self-pay | Admitting: Family Medicine

## 2022-11-28 DIAGNOSIS — J069 Acute upper respiratory infection, unspecified: Secondary | ICD-10-CM | POA: Diagnosis not present

## 2022-11-28 DIAGNOSIS — R059 Cough, unspecified: Secondary | ICD-10-CM | POA: Diagnosis not present

## 2022-11-28 DIAGNOSIS — R058 Other specified cough: Secondary | ICD-10-CM

## 2022-12-03 DIAGNOSIS — R053 Chronic cough: Secondary | ICD-10-CM | POA: Diagnosis not present

## 2022-12-03 DIAGNOSIS — R490 Dysphonia: Secondary | ICD-10-CM | POA: Diagnosis not present

## 2022-12-03 DIAGNOSIS — Z8521 Personal history of malignant neoplasm of larynx: Secondary | ICD-10-CM | POA: Diagnosis not present

## 2022-12-09 DIAGNOSIS — K219 Gastro-esophageal reflux disease without esophagitis: Secondary | ICD-10-CM | POA: Diagnosis not present

## 2022-12-09 DIAGNOSIS — R1032 Left lower quadrant pain: Secondary | ICD-10-CM | POA: Diagnosis not present

## 2022-12-09 DIAGNOSIS — Z86010 Personal history of colonic polyps: Secondary | ICD-10-CM | POA: Diagnosis not present

## 2022-12-09 DIAGNOSIS — K5730 Diverticulosis of large intestine without perforation or abscess without bleeding: Secondary | ICD-10-CM | POA: Diagnosis not present

## 2022-12-09 DIAGNOSIS — Z800 Family history of malignant neoplasm of digestive organs: Secondary | ICD-10-CM | POA: Diagnosis not present

## 2022-12-16 DIAGNOSIS — R490 Dysphonia: Secondary | ICD-10-CM | POA: Diagnosis not present

## 2022-12-16 DIAGNOSIS — Z8521 Personal history of malignant neoplasm of larynx: Secondary | ICD-10-CM | POA: Diagnosis not present

## 2022-12-16 DIAGNOSIS — R1313 Dysphagia, pharyngeal phase: Secondary | ICD-10-CM | POA: Diagnosis not present

## 2022-12-29 DIAGNOSIS — L570 Actinic keratosis: Secondary | ICD-10-CM | POA: Diagnosis not present

## 2022-12-29 DIAGNOSIS — Z85828 Personal history of other malignant neoplasm of skin: Secondary | ICD-10-CM | POA: Diagnosis not present

## 2022-12-29 DIAGNOSIS — L309 Dermatitis, unspecified: Secondary | ICD-10-CM | POA: Diagnosis not present

## 2023-01-02 DIAGNOSIS — M25552 Pain in left hip: Secondary | ICD-10-CM | POA: Diagnosis not present

## 2023-01-02 DIAGNOSIS — Z96642 Presence of left artificial hip joint: Secondary | ICD-10-CM | POA: Diagnosis not present

## 2023-01-08 DIAGNOSIS — R490 Dysphonia: Secondary | ICD-10-CM | POA: Diagnosis not present

## 2023-01-08 DIAGNOSIS — K219 Gastro-esophageal reflux disease without esophagitis: Secondary | ICD-10-CM | POA: Diagnosis not present

## 2023-01-08 DIAGNOSIS — K5730 Diverticulosis of large intestine without perforation or abscess without bleeding: Secondary | ICD-10-CM | POA: Diagnosis not present

## 2023-01-08 DIAGNOSIS — Z86010 Personal history of colonic polyps: Secondary | ICD-10-CM | POA: Diagnosis not present

## 2023-01-08 DIAGNOSIS — R1032 Left lower quadrant pain: Secondary | ICD-10-CM | POA: Diagnosis not present

## 2023-01-14 DIAGNOSIS — K2289 Other specified disease of esophagus: Secondary | ICD-10-CM | POA: Diagnosis not present

## 2023-01-14 DIAGNOSIS — B3781 Candidal esophagitis: Secondary | ICD-10-CM | POA: Diagnosis not present

## 2023-01-14 DIAGNOSIS — K2950 Unspecified chronic gastritis without bleeding: Secondary | ICD-10-CM | POA: Diagnosis not present

## 2023-01-14 DIAGNOSIS — R1310 Dysphagia, unspecified: Secondary | ICD-10-CM | POA: Diagnosis not present

## 2023-01-14 DIAGNOSIS — K219 Gastro-esophageal reflux disease without esophagitis: Secondary | ICD-10-CM | POA: Diagnosis not present

## 2023-01-14 DIAGNOSIS — K229 Disease of esophagus, unspecified: Secondary | ICD-10-CM | POA: Diagnosis not present

## 2023-01-14 DIAGNOSIS — K449 Diaphragmatic hernia without obstruction or gangrene: Secondary | ICD-10-CM | POA: Diagnosis not present

## 2023-01-30 DIAGNOSIS — B3781 Candidal esophagitis: Secondary | ICD-10-CM | POA: Diagnosis not present

## 2023-02-10 DIAGNOSIS — Z86010 Personal history of colonic polyps: Secondary | ICD-10-CM | POA: Diagnosis not present

## 2023-02-10 DIAGNOSIS — K5900 Constipation, unspecified: Secondary | ICD-10-CM | POA: Diagnosis not present

## 2023-02-10 DIAGNOSIS — K219 Gastro-esophageal reflux disease without esophagitis: Secondary | ICD-10-CM | POA: Diagnosis not present

## 2023-02-10 DIAGNOSIS — K5730 Diverticulosis of large intestine without perforation or abscess without bleeding: Secondary | ICD-10-CM | POA: Diagnosis not present

## 2023-02-10 DIAGNOSIS — R490 Dysphonia: Secondary | ICD-10-CM | POA: Diagnosis not present

## 2023-02-11 ENCOUNTER — Other Ambulatory Visit: Payer: Self-pay | Admitting: Otolaryngology

## 2023-02-11 ENCOUNTER — Ambulatory Visit
Admission: RE | Admit: 2023-02-11 | Discharge: 2023-02-11 | Disposition: A | Discharge status: Home / Self Care | Payer: Medicare PPO | Source: Ambulatory Visit | Attending: Otolaryngology | Admitting: Otolaryngology

## 2023-02-11 DIAGNOSIS — R053 Chronic cough: Secondary | ICD-10-CM

## 2023-02-11 DIAGNOSIS — Z8521 Personal history of malignant neoplasm of larynx: Secondary | ICD-10-CM | POA: Diagnosis not present

## 2023-02-11 DIAGNOSIS — R0600 Dyspnea, unspecified: Secondary | ICD-10-CM | POA: Diagnosis not present

## 2023-02-11 DIAGNOSIS — R490 Dysphonia: Secondary | ICD-10-CM | POA: Diagnosis not present

## 2023-02-11 DIAGNOSIS — R1313 Dysphagia, pharyngeal phase: Secondary | ICD-10-CM | POA: Diagnosis not present

## 2023-02-11 DIAGNOSIS — J381 Polyp of vocal cord and larynx: Secondary | ICD-10-CM | POA: Diagnosis not present

## 2023-02-13 ENCOUNTER — Other Ambulatory Visit: Payer: Self-pay | Admitting: Otolaryngology

## 2023-02-13 ENCOUNTER — Encounter (HOSPITAL_BASED_OUTPATIENT_CLINIC_OR_DEPARTMENT_OTHER): Payer: Self-pay | Admitting: Otolaryngology

## 2023-02-13 ENCOUNTER — Other Ambulatory Visit: Payer: Self-pay

## 2023-02-20 ENCOUNTER — Other Ambulatory Visit: Payer: Self-pay

## 2023-02-20 ENCOUNTER — Ambulatory Visit (HOSPITAL_BASED_OUTPATIENT_CLINIC_OR_DEPARTMENT_OTHER): Payer: Medicare PPO | Admitting: Anesthesiology

## 2023-02-20 ENCOUNTER — Encounter (HOSPITAL_BASED_OUTPATIENT_CLINIC_OR_DEPARTMENT_OTHER)
Admission: RE | Disposition: A | Discharge status: Home / Self Care | Payer: Self-pay | Source: Home / Self Care | Attending: Otolaryngology

## 2023-02-20 ENCOUNTER — Ambulatory Visit (HOSPITAL_BASED_OUTPATIENT_CLINIC_OR_DEPARTMENT_OTHER)
Admission: RE | Admit: 2023-02-20 | Discharge: 2023-02-20 | Disposition: A | Discharge status: Home / Self Care | Payer: Medicare PPO | Attending: Otolaryngology | Admitting: Otolaryngology

## 2023-02-20 ENCOUNTER — Encounter (HOSPITAL_BASED_OUTPATIENT_CLINIC_OR_DEPARTMENT_OTHER): Payer: Self-pay | Admitting: Otolaryngology

## 2023-02-20 DIAGNOSIS — J383 Other diseases of vocal cords: Secondary | ICD-10-CM

## 2023-02-20 DIAGNOSIS — E785 Hyperlipidemia, unspecified: Secondary | ICD-10-CM | POA: Diagnosis not present

## 2023-02-20 DIAGNOSIS — M1990 Unspecified osteoarthritis, unspecified site: Secondary | ICD-10-CM | POA: Diagnosis not present

## 2023-02-20 DIAGNOSIS — K219 Gastro-esophageal reflux disease without esophagitis: Secondary | ICD-10-CM | POA: Diagnosis not present

## 2023-02-20 DIAGNOSIS — F419 Anxiety disorder, unspecified: Secondary | ICD-10-CM | POA: Diagnosis not present

## 2023-02-20 DIAGNOSIS — G4730 Sleep apnea, unspecified: Secondary | ICD-10-CM | POA: Diagnosis not present

## 2023-02-20 DIAGNOSIS — R519 Headache, unspecified: Secondary | ICD-10-CM | POA: Diagnosis not present

## 2023-02-20 DIAGNOSIS — R059 Cough, unspecified: Secondary | ICD-10-CM | POA: Diagnosis not present

## 2023-02-20 DIAGNOSIS — R1313 Dysphagia, pharyngeal phase: Secondary | ICD-10-CM | POA: Insufficient documentation

## 2023-02-20 DIAGNOSIS — R490 Dysphonia: Secondary | ICD-10-CM | POA: Insufficient documentation

## 2023-02-20 DIAGNOSIS — Z87891 Personal history of nicotine dependence: Secondary | ICD-10-CM | POA: Diagnosis not present

## 2023-02-20 DIAGNOSIS — Z8521 Personal history of malignant neoplasm of larynx: Secondary | ICD-10-CM | POA: Diagnosis not present

## 2023-02-20 DIAGNOSIS — J381 Polyp of vocal cord and larynx: Secondary | ICD-10-CM | POA: Diagnosis not present

## 2023-02-20 DIAGNOSIS — R053 Chronic cough: Secondary | ICD-10-CM | POA: Diagnosis not present

## 2023-02-20 DIAGNOSIS — R499 Unspecified voice and resonance disorder: Secondary | ICD-10-CM | POA: Diagnosis not present

## 2023-02-20 HISTORY — PX: MICROLARYNGOSCOPY WITH CO2 LASER AND EXCISION OF VOCAL CORD LESION: SHX5970

## 2023-02-20 SURGERY — MICROLARYNGOSCOPY WITH CO2 LASER AND EXCISION OF VOCAL CORD LESION
Anesthesia: General | Site: Throat | Laterality: Bilateral

## 2023-02-20 MED ORDER — EPINEPHRINE PF 1 MG/ML IJ SOLN
INTRAMUSCULAR | Status: DC | PRN
  Administered 2023-02-20: 2 mg

## 2023-02-20 MED ORDER — FENTANYL CITRATE (PF) 100 MCG/2ML IJ SOLN
INTRAMUSCULAR | Status: AC
  Filled 2023-02-20: qty 2

## 2023-02-20 MED ORDER — FENTANYL CITRATE (PF) 100 MCG/2ML IJ SOLN: 25.0000 ug | INTRAMUSCULAR | Status: DC | PRN

## 2023-02-20 MED ORDER — AMISULPRIDE (ANTIEMETIC) 5 MG/2ML IV SOLN: 10.0000 mg | Freq: Once | INTRAVENOUS | Status: DC | PRN

## 2023-02-20 MED ORDER — LIDOCAINE 2% (20 MG/ML) 5 ML SYRINGE
INTRAMUSCULAR | Status: AC
  Filled 2023-02-20: qty 20

## 2023-02-20 MED ORDER — ONDANSETRON HCL 4 MG/2ML IJ SOLN
INTRAMUSCULAR | Status: DC | PRN
  Administered 2023-02-20: 4 mg via INTRAVENOUS

## 2023-02-20 MED ORDER — LIDOCAINE 2% (20 MG/ML) 5 ML SYRINGE
INTRAMUSCULAR | Status: DC | PRN
  Administered 2023-02-20: 40 mg via INTRAVENOUS

## 2023-02-20 MED ORDER — ACETAMINOPHEN 325 MG PO TABS: 325.0000 mg | ORAL_TABLET | ORAL | Status: DC | PRN

## 2023-02-20 MED ORDER — OXYCODONE HCL 5 MG/5ML PO SOLN: 5.0000 mg | Freq: Once | ORAL | Status: DC | PRN

## 2023-02-20 MED ORDER — FENTANYL CITRATE (PF) 250 MCG/5ML IJ SOLN
INTRAMUSCULAR | Status: DC | PRN
  Administered 2023-02-20: 100 ug via INTRAVENOUS

## 2023-02-20 MED ORDER — ACETAMINOPHEN 160 MG/5ML PO SOLN: 325.0000 mg | ORAL | Status: DC | PRN

## 2023-02-20 MED ORDER — ACETAMINOPHEN 500 MG PO TABS
ORAL_TABLET | ORAL | Status: AC
  Filled 2023-02-20: qty 2

## 2023-02-20 MED ORDER — OXYCODONE HCL 5 MG PO TABS: 5.0000 mg | ORAL_TABLET | Freq: Once | ORAL | Status: DC | PRN

## 2023-02-20 MED ORDER — SUGAMMADEX SODIUM 200 MG/2ML IV SOLN
INTRAVENOUS | Status: DC | PRN
  Administered 2023-02-20: 200 mg via INTRAVENOUS

## 2023-02-20 MED ORDER — ACETAMINOPHEN 500 MG PO TABS
1000.0000 mg | ORAL_TABLET | Freq: Once | ORAL | Status: AC
  Administered 2023-02-20: 1000 mg via ORAL

## 2023-02-20 MED ORDER — EPINEPHRINE PF 1 MG/ML IJ SOLN
INTRAMUSCULAR | Status: AC
  Filled 2023-02-20: qty 2

## 2023-02-20 MED ORDER — ROCURONIUM BROMIDE 10 MG/ML (PF) SYRINGE
PREFILLED_SYRINGE | INTRAVENOUS | Status: DC | PRN
  Administered 2023-02-20: 60 mg via INTRAVENOUS

## 2023-02-20 MED ORDER — DEXAMETHASONE SODIUM PHOSPHATE 10 MG/ML IJ SOLN
INTRAMUSCULAR | Status: DC | PRN
  Administered 2023-02-20: 8 mg via INTRAVENOUS

## 2023-02-20 MED ORDER — LACTATED RINGERS IV SOLN: INTRAVENOUS | Status: DC

## 2023-02-20 MED ORDER — VANCOMYCIN HCL IN DEXTROSE 1-5 GM/200ML-% IV SOLN: 1000.0000 mg | INTRAVENOUS | Status: DC

## 2023-02-20 MED ORDER — PROPOFOL 10 MG/ML IV BOLUS
INTRAVENOUS | Status: DC | PRN
  Administered 2023-02-20: 130 mg via INTRAVENOUS

## 2023-02-20 MED ORDER — VANCOMYCIN HCL IN DEXTROSE 1-5 GM/200ML-% IV SOLN
INTRAVENOUS | Status: AC
  Filled 2023-02-20: qty 200

## 2023-02-20 MED ORDER — VANCOMYCIN HCL 500 MG/100ML IV SOLN
INTRAVENOUS | Status: AC
  Filled 2023-02-20: qty 100

## 2023-02-20 MED ORDER — ACETAMINOPHEN 10 MG/ML IV SOLN: 1000.0000 mg | Freq: Once | INTRAVENOUS | Status: DC | PRN

## 2023-02-20 SURGICAL SUPPLY — 24 items
CANISTER SUCT 1200ML W/VALVE (MISCELLANEOUS) ×1 IMPLANT
CNTNR URN SCR LID CUP LEK RST (MISCELLANEOUS) IMPLANT
CONT SPEC 4OZ STRL OR WHT (MISCELLANEOUS)
DEFOGGER MIRROR 1QT (MISCELLANEOUS) IMPLANT
DEPRESSOR TONGUE 6 IN STERILE (GAUZE/BANDAGES/DRESSINGS) ×1 IMPLANT
GAUZE SPONGE 4X4 12PLY STRL LF (GAUZE/BANDAGES/DRESSINGS) ×2 IMPLANT
GLOVE BIO SURGEON STRL SZ7.5 (GLOVE) ×1 IMPLANT
GOWN STRL REUS W/ TWL LRG LVL3 (GOWN DISPOSABLE) IMPLANT
GOWN STRL REUS W/TWL LRG LVL3 (GOWN DISPOSABLE)
GUARD TEETH (MISCELLANEOUS) ×1 IMPLANT
NDL HYPO 18GX1.5 BLUNT FILL (NEEDLE) ×1 IMPLANT
NDL SPNL 22GX7 QUINCKE BK (NEEDLE) IMPLANT
NEEDLE HYPO 18GX1.5 BLUNT FILL (NEEDLE) ×1 IMPLANT
NEEDLE SPNL 22GX7 QUINCKE BK (NEEDLE) IMPLANT
NS IRRIG 1000ML POUR BTL (IV SOLUTION) ×1 IMPLANT
PACK BASIN DAY SURGERY FS (CUSTOM PROCEDURE TRAY) ×1 IMPLANT
PATTIES SURGICAL .5 X3 (DISPOSABLE) ×1 IMPLANT
SHEET MEDIUM DRAPE 40X70 STRL (DRAPES) ×1 IMPLANT
SLEEVE SCD COMPRESS KNEE MED (STOCKING) IMPLANT
SOL ANTI FOG 6CC (MISCELLANEOUS) IMPLANT
SYR 5ML LL (SYRINGE) ×1 IMPLANT
SYR CONTROL 10ML LL (SYRINGE) IMPLANT
TOWEL GREEN STERILE FF (TOWEL DISPOSABLE) ×1 IMPLANT
TUBE CONNECTING 20X1/4 (TUBING) ×1 IMPLANT

## 2023-02-20 NOTE — Discharge Instructions (Addendum)
  Post Anesthesia Home Care Instructions  Activity: Get plenty of rest for the remainder of the day. A responsible individual must stay with you for 24 hours following the procedure.  For the next 24 hours, DO NOT: -Drive a car -Operate machinery -Drink alcoholic beverages -Take any medication unless instructed by your physician -Make any legal decisions or sign important papers.  Meals: Start with liquid foods such as gelatin or soup. Progress to regular foods as tolerated. Avoid greasy, spicy, heavy foods. If nausea and/or vomiting occur, drink only clear liquids until the nausea and/or vomiting subsides. Call your physician if vomiting continues.  Special Instructions/Symptoms: Your throat may feel dry or sore from the anesthesia or the breathing tube placed in your throat during surgery. If this causes discomfort, gargle with warm salt water. The discomfort should disappear within 24 hours.  If you had a scopolamine patch placed behind your ear for the management of post- operative nausea and/or vomiting:  1. The medication in the patch is effective for 72 hours, after which it should be removed.  Wrap patch in a tissue and discard in the trash. Wash hands thoroughly with soap and water. 2. You may remove the patch earlier than 72 hours if you experience unpleasant side effects which may include dry mouth, dizziness or visual disturbances. 3. Avoid touching the patch. Wash your hands with soap and water after contact with the patch.  No tylenol until after 4pm today if needed.     

## 2023-02-20 NOTE — Brief Op Note (Signed)
02/20/2023  10:58 AM  PATIENT:  Peter Drake  77 y.o. male  PRE-OPERATIVE DIAGNOSIS:  History of laryngeal cancer, Hoarseness, Vocal cord polyp  POST-OPERATIVE DIAGNOSIS:  History of laryngeal cancer, Hoarseness, Vocal cord polyp  PROCEDURE:  Procedure(s): MICROLARYNGOSCOPY WITH CO2 LASER AND EXCISION OF VOCAL CORD LESION (Bilateral)  SURGEON:  Surgeon(s) and Role:    Christia Reading, MD - Primary  PHYSICIAN ASSISTANT:   ASSISTANTS: none   ANESTHESIA:   general  EBL:  Minimal   BLOOD ADMINISTERED:none  DRAINS: none   LOCAL MEDICATIONS USED:  NONE  SPECIMEN:  Source of Specimen:  Right true vocal fold lesion and right false vocal fold lesion  DISPOSITION OF SPECIMEN:  PATHOLOGY  COUNTS:  YES  TOURNIQUET:  * No tourniquets in log *  DICTATION: .Note written in EPIC  PLAN OF CARE: Discharge to home after PACU  PATIENT DISPOSITION:  PACU - hemodynamically stable.   Delay start of Pharmacological VTE agent (>24hrs) due to surgical blood loss or risk of bleeding: no

## 2023-02-20 NOTE — Op Note (Signed)
PREOPERATIVE DIAGNOSIS:  Hoarseness, history of laryngeal cancer, and right vocal cord lesion   POSTOPERATIVE DIAGNOSIS:  Hoarseness, history of laryngeal cancer, and right vocal cord lesion   PROCEDURE:  Suspended microdirect laryngoscopy with CO2 laser excision of right vocal fold lesions   SURGEON:  Christia Reading, MD  ANESTHESIA:  General with jet ventilation by anesthesia.   COMPLICATIONS:  None.   INDICATIONS:  The patient is a 77 year old male with a history of laryngeal cancer with recent worsening hoarseness found to be associated with a small polyp on the right true vocal fold.  There is a larger polyp more superiorly on the right false vocal fold.  He presents to the operating room for surgical management.   FINDINGS:  Right vocal fold with small polyp on striking surface.  Larger polyp on right false vocal fold.   DESCRIPTION OF PROCEDURE:  The patient was identified in the holding room, informed consent having been obtained including discussion of risks, benefits and alternatives, the patient was brought to the operative suite and put the operative table in  supine position.  Anesthesia was induced and the patient was intubated by the Anesthesia team using a small laser-safe tube without difficulty.  The eyes were taped closed and bed was turned 90 degrees from anesthesia.  The patient was given intravenous steroids during the  case.  A tooth guard was placed over the upper teeth and a Dedo laryngoscope was placed into the supraglottic position and suspended to Mayo stand using the Lewy arm.  Damp eye pads were taped over the eyes and a damp towel was placed over the face.  Photodocumentation was performed with the zero degree telescope.  An epinephrine-soaked pledget was held against the lesion for a minute or so.  The operating microscope was brought into the field and was used to evaluate the larynx.  The right-sided true fold lesion was grasped with a cup forceps and retracted  medially while an incision was made along the natural contour of the vocal fold using the CO2 laser on a setting of 4 watts, removing the lesion.  It was passed to nursing for pathology.  An epinephrine-soaked pledget was again held against the site for a minute or so.  The same was then performed with the right false vocal fold lesion.  This was passed separately for pathology.  Again, an epinephrine-soaked pledget was held in the site.  This was then removed and the larynx suction.  Photodocumentation was repeated.  The larynx was sprayed with topical lidocaine.  The laryngoscope was then taking out of suspension and removed from the patient's mouth while suctioning the airway.  The tooth guard was removed and the patient was turned back to anesthesia for wakeup and taken to the recovery room in stable condition.

## 2023-02-20 NOTE — H&P (Addendum)
Peter Drake is an 77 y.o. male.   Chief Complaint: Hoarseness, vocal fold polyps HPI: 77 year old male with history of vocal cord cancer with some recent worsened hoarseness found to be due to a small, benign-appearing bump on the right vocal fold.  There is a larger bump superior to the right vocal fold as well.  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   PVC (premature ventricular contraction)    > 20 years, occasional PVCs on monitor, no specific treatment recommended by cardiology then (as of 09/16/22)ported   Radiation 08/04/08-09/20/08   Larynx   33 treatments   Sleep apnea    borderline no cpap needed    Past Surgical History:  Procedure Laterality Date   ANTERIOR CERVICAL DECOMP/DISCECTOMY FUSION N/A 09/24/2022   Procedure: ANTERIOR CERVICAL DISCECTOMY FUSION CERVICAL THREE - CERVICAL FOUR, CERVICAL FOUR - CERVICAL FIVE, CERVICAL FIVE - CERVICAL SIX;  Surgeon: Tia Alert, MD;  Location: [facility] OR;  Service: Neurosurgery;  Laterality: N/A;   basal cell excisions     by dr. hall   CERVICAL SPINE SURGERY     COLONOSCOPY W/ BIOPSIES AND POLYPECTOMY     Hx: of   COLONOSCOPY WITH PROPOFOL N/A 12/23/2018   Procedure: COLONOSCOPY WITH PROPOFOL;  Surgeon: Charna Elizabeth, MD;  Location: [facility] END[facility]COPY;  Service: Endoscopy;  Laterality: N/A;   ESOPHAGOGASTRODUODENOSCOPY (EGD) WITH PROPOFOL N/A 12/23/2018   Procedure: ESOPHAGOGASTRODUODENOSCOPY (EGD)  WITH PROPOFOL;  Surgeon: Charna Elizabeth, MD;  Location: [facility];  Service: Endoscopy;  Laterality: N/A;   EYE SURGERY     bil with implant, remove cataracts   FINGER SURGERY     right fifth   INGUINAL HERNIA REPAIR  03/2005   left    INGUINAL HERNIA REPAIR Right 10/28/2022   Procedure: LAPAROSCOPIC RIGHT INGUINAL HERNIA REPAIR WITH MESH XL 5X7 3DMAX;  Surgeon: Axel Filler, MD;  Location: [facility] OR;  Service: General;  Laterality: Right;   left knee arthoscopy     x 2   MOHS SURGERY     x3   NASAL SINUS SURGERY  1990's   polyp removal     squamous cell  33 radiation treatments vocal cord   POLYPECTOMY  12/23/2018   Procedure: POLYPECTOMY;  Surgeon: Charna Elizabeth, MD;  Location: [facility];  Service: Endoscopy;;   RADIAL KERATOTOMY     20 years ago/ bi lateral   TONSILLECTOMY     as a child   TOTAL HIP ARTHROPLASTY Left 05/06/2022   Procedure: TOTAL HIP ARTHROPLASTY ANTERIOR APPROACH;  Surgeon: Durene Romans, MD;  Location: [facility] ORS;  Service: Orthopedics;  Laterality: Left;   TOTAL SHOULDER ARTHROPLASTY Right 11/03/2013   DR SUPPLE   TOTAL SHOULDER ARTHROPLASTY Right 11/02/2013   Procedure: RIGHT TOTAL SHOULDER ARTHROPLASTY;  Surgeon: Senaida Lange, MD;  Location: [facility] OR;  Service: Orthopedics;  Laterality: Right;   TOTAL SHOULDER ARTHROPLASTY Left 10/04/2020   Procedure: TOTAL SHOULDER ARTHROPLASTY;  Surgeon: Francena Hanly, MD;  Location: [facility] ORS;  Service: Orthopedics;  Laterality: Left;     Family History  Problem Relation Age of Onset   Heart disease Mother    COPD Father    Rectal cancer Sister    Cancer Sister        rectal   Melanoma Daughter 51       treated   Social History:  reports that he quit smoking about 44 years ago. His smoking use included cigarettes. He has a 15.00 pack-year smoking history. He quit smokeless tobacco use about 26 years ago.  His smokeless tobacco use included chew. He reports current alcohol use of about 4.0 - 6.0 standard drinks of  alcohol per week. He reports that he does not use drugs.  Allergies:  Allergies  Allergen Reactions   Penicillins Hives    Did it involve swelling of the face/tongue/throat, SOB, or low BP? Unknown Did it involve sudden or severe rash/hives, skin peeling, or any reaction on the inside of your mouth or nose? Unknown Did you need to seek medical attention at a hospital or doctor's office? Unknown When did it last happen?      Childhood allergy If all above answers are "NO", may proceed with cephalosporin use. Patient tolerated ancef administration without adverse reaction.    Medications Prior to Admission  Medication Sig Dispense Refill   acetaminophen (TYLENOL) 500 MG tablet Take 1,000 mg by mouth 2 (two) times daily as needed for moderate pain.     pantoprazole (PROTONIX) 40 MG tablet Take 40 mg by mouth daily before breakfast.     Probiotic Product (PROBIOTIC PO) Take by mouth.     simvastatin (ZOCOR) 20 MG tablet Take 20 mg by mouth every evening.     Tamsulosin HCl (FLOMAX) 0.4 MG CAPS Take 0.4 mg by mouth 2 (two) times daily.      tiZANidine (ZANAFLEX) 4 MG tablet Take 1 tablet (4 mg total) by mouth 3 (three) times daily as needed (Pain). 30 tablet 0   traZODone (DESYREL) 50 MG tablet Take 50 mg by mouth at bedtime.     docusate sodium (COLACE) 100 MG capsule Take 1 capsule (100 mg total) by mouth 2 (two) times daily. 10 capsule 0   fluticasone (FLONASE) 50 MCG/ACT nasal spray Place 2 sprays into both nostrils at bedtime.     gabapentin (NEURONTIN) 100 MG capsule Take 100 mg by mouth daily as needed (pain).     HYDROcodone-acetaminophen (NORCO/VICODIN) 5-325 MG tablet Take 1 tablet by mouth every 4 (four) hours as needed for moderate pain. (Patient not taking: Reported on 10/23/2022) 20 tablet 0   traMADol (ULTRAM) 50 MG tablet Take 1 tablet (50 mg total) by mouth every 6 (six) hours as needed. 20 tablet 0    No results found for this or any previous visit (from the past 48  hour(s)). No results found.  Review of Systems  HENT:  Positive for trouble swallowing and voice change.   Respiratory:  Positive for cough.   All other systems reviewed and are negative.   Blood pressure (!) 148/85, pulse 67, temperature 99.1 F (37.3 C), temperature source Temporal, resp. rate 16, height 5\' 9"  (1.753 m), weight 66.7 kg, SpO2 100 %. Physical Exam Constitutional:      Appearance: Normal appearance.  He is normal weight.  HENT:     Head: Normocephalic and atraumatic.     Right Ear: External ear normal.     Left Ear: External ear normal.     Nose: Nose normal.     Mouth/Throat:     Mouth: Mucous membranes are moist.     Pharynx: Oropharynx is clear.     Comments: Mildly hoarse. Eyes:     Extraocular Movements: Extraocular movements intact.     Conjunctiva/sclera: Conjunctivae normal.     Pupils: Pupils are equal, round, and reactive to light.  Cardiovascular:     Rate and Rhythm: Normal rate.  Pulmonary:     Effort: Pulmonary effort is normal.  Musculoskeletal:     Cervical back: Normal range of motion.  Skin:    General: Skin is warm and dry.  Neurological:     General: No focal deficit present.     Mental Status: He is alert and oriented to person, place, and time.  Psychiatric:        Mood and Affect: Mood normal.        Behavior: Behavior normal.        Thought Content: Thought content normal.        Judgment: Judgment normal.      Assessment/Plan Hoarseness, history of vocal cord cancer, vocal cord polyps  To OR for SMDL with CO2 laser excision of vocal cord polyps.  Christia Reading, MD 02/20/2023, 10:05 AM

## 2023-02-20 NOTE — Anesthesia Postprocedure Evaluation (Signed)
Anesthesia Post Note  Patient: TAVAREZ AFONSO  Procedure(s) Performed: MICROLARYNGOSCOPY WITH CO2 LASER AND EXCISION OF VOCAL CORD LESION (Bilateral: Throat)     Patient location during evaluation: PACU Anesthesia Type: General Level of consciousness: awake and alert Pain management: pain level controlled Vital Signs Assessment: post-procedure vital signs reviewed and stable Respiratory status: spontaneous breathing, nonlabored ventilation, respiratory function stable and patient connected to nasal cannula oxygen Cardiovascular status: blood pressure returned to baseline and stable Postop Assessment: no apparent nausea or vomiting Anesthetic complications: no  No notable events documented.  Last Vitals:  Vitals:   02/20/23 1130 02/20/23 1143  BP: 137/76 (!) 148/84  Pulse: (!) 51 (!) 50  Resp: 14 16  Temp:  (!) 36.3 C  SpO2: 98% 97%    Last Pain:  Vitals:   02/20/23 1143  TempSrc: Temporal  PainSc: 0-No pain                 Shelton Silvas

## 2023-02-20 NOTE — Transfer of Care (Signed)
Immediate Anesthesia Transfer of Care Note  Patient: Peter Drake  Procedure(s) Performed: MICROLARYNGOSCOPY WITH CO2 LASER AND EXCISION OF VOCAL CORD LESION (Bilateral: Throat)  Patient Location: PACU  Anesthesia Type:General  Level of Consciousness: awake, alert , oriented, and patient cooperative  Airway & Oxygen Therapy: Patient Spontanous Breathing  Post-op Assessment: Report given to RN and Post -op Vital signs reviewed and stable  Post vital signs: Reviewed and stable  Last Vitals:  Vitals Value Taken Time  BP 123/71   Temp    Pulse 56 02/20/23 1112  Resp    SpO2 98 % 02/20/23 1112  Vitals shown include unvalidated device data.  Last Pain:  Vitals:   02/20/23 0836  TempSrc: Temporal  PainSc: 0-No pain      Patients Stated Pain Goal: 1 (02/20/23 0836)  Complications: No notable events documented.

## 2023-02-20 NOTE — Anesthesia Procedure Notes (Signed)
Procedure Name: Intubation Date/Time: 02/20/2023 10:36 AM  Performed by: Demetrio Lapping, CRNAPre-anesthesia Checklist: Patient identified, Emergency Drugs available, Suction available and Patient being monitored Patient Re-evaluated:Patient Re-evaluated prior to induction Oxygen Delivery Method: Circle System Utilized Preoxygenation: Pre-oxygenation with 100% oxygen Induction Type: IV induction Ventilation: Mask ventilation without difficulty Laryngoscope Size: Mac and 4 Grade View: Grade I Tube type: Oral (laser dual cuffed ETT) Tube size: 6.0 mm Number of attempts: 1 Airway Equipment and Method: Stylet and Oral airway Placement Confirmation: ETT inserted through vocal cords under direct vision, positive ETCO2 and breath sounds checked- equal and bilateral Tube secured with: Tape Dental Injury: Teeth and Oropharynx as per pre-operative assessment

## 2023-02-20 NOTE — Anesthesia Preprocedure Evaluation (Addendum)
Anesthesia Evaluation  Patient identified by MRN, date of birth, ID band Patient awake    Reviewed: Allergy & Precautions, NPO status , Patient's Chart, lab work & pertinent test results  Airway Mallampati: I  TM Distance: >3 FB Neck ROM: Full    Dental  (+) Teeth Intact, Dental Advisory Given   Pulmonary sleep apnea , former smoker   breath sounds clear to auscultation       Cardiovascular negative cardio ROS  Rhythm:Regular Rate:Normal     Neuro/Psych  Headaches  Anxiety      Neuromuscular disease    GI/Hepatic Neg liver ROS,GERD  ,,  Endo/Other  negative endocrine ROS    Renal/GU negative Renal ROS     Musculoskeletal  (+) Arthritis ,    Abdominal   Peds  Hematology negative hematology ROS (+)   Anesthesia Other Findings   Reproductive/Obstetrics                             Anesthesia Physical Anesthesia Plan  ASA: 2  Anesthesia Plan: General   Post-op Pain Management: Tylenol PO (pre-op)*   Induction: Intravenous  PONV Risk Score and Plan: 3 and Ondansetron, Dexamethasone and Midazolam  Airway Management Planned: Oral ETT  Additional Equipment: None  Intra-op Plan:   Post-operative Plan: Extubation in OR  Informed Consent: I have reviewed the patients History and Physical, chart, labs and discussed the procedure including the risks, benefits and alternatives for the proposed anesthesia with the patient or authorized representative who has indicated his/her understanding and acceptance.     Dental advisory given  Plan Discussed with: CRNA  Anesthesia Plan Comments:        Anesthesia Quick Evaluation

## 2023-02-23 ENCOUNTER — Encounter (HOSPITAL_BASED_OUTPATIENT_CLINIC_OR_DEPARTMENT_OTHER): Payer: Self-pay | Admitting: Otolaryngology

## 2023-02-23 LAB — SURGICAL PATHOLOGY

## 2023-03-05 DIAGNOSIS — J381 Polyp of vocal cord and larynx: Secondary | ICD-10-CM | POA: Diagnosis not present

## 2023-03-17 ENCOUNTER — Other Ambulatory Visit (HOSPITAL_COMMUNITY): Payer: Self-pay | Admitting: Family Medicine

## 2023-03-17 DIAGNOSIS — R634 Abnormal weight loss: Secondary | ICD-10-CM | POA: Diagnosis not present

## 2023-03-17 DIAGNOSIS — R109 Unspecified abdominal pain: Secondary | ICD-10-CM | POA: Diagnosis not present

## 2023-03-17 DIAGNOSIS — G479 Sleep disorder, unspecified: Secondary | ICD-10-CM | POA: Diagnosis not present

## 2023-03-17 DIAGNOSIS — F329 Major depressive disorder, single episode, unspecified: Secondary | ICD-10-CM | POA: Diagnosis not present

## 2023-03-18 ENCOUNTER — Ambulatory Visit (HOSPITAL_BASED_OUTPATIENT_CLINIC_OR_DEPARTMENT_OTHER)
Admission: RE | Admit: 2023-03-18 | Discharge: 2023-03-18 | Disposition: A | Discharge status: Home / Self Care | Payer: Medicare PPO | Source: Ambulatory Visit | Attending: Family Medicine | Admitting: Family Medicine

## 2023-03-18 DIAGNOSIS — I700 Atherosclerosis of aorta: Secondary | ICD-10-CM | POA: Diagnosis not present

## 2023-03-18 DIAGNOSIS — R1032 Left lower quadrant pain: Secondary | ICD-10-CM | POA: Diagnosis not present

## 2023-03-18 DIAGNOSIS — Z96642 Presence of left artificial hip joint: Secondary | ICD-10-CM | POA: Diagnosis not present

## 2023-03-18 DIAGNOSIS — R109 Unspecified abdominal pain: Secondary | ICD-10-CM | POA: Insufficient documentation

## 2023-03-18 MED ORDER — IOHEXOL 300 MG/ML  SOLN
100.0000 mL | Freq: Once | INTRAMUSCULAR | Status: AC | PRN
  Administered 2023-03-18: 80 mL via INTRAVENOUS

## 2023-03-22 ENCOUNTER — Other Ambulatory Visit: Payer: Self-pay

## 2023-03-22 ENCOUNTER — Emergency Department (HOSPITAL_COMMUNITY)
Admission: EM | Admit: 2023-03-22 | Discharge: 2023-03-22 | Disposition: A | Discharge status: Home / Self Care | Payer: Medicare PPO | Attending: Emergency Medicine | Admitting: Emergency Medicine

## 2023-03-22 ENCOUNTER — Encounter (HOSPITAL_COMMUNITY): Payer: Self-pay

## 2023-03-22 ENCOUNTER — Emergency Department (HOSPITAL_COMMUNITY): Payer: Medicare PPO

## 2023-03-22 DIAGNOSIS — R002 Palpitations: Secondary | ICD-10-CM | POA: Insufficient documentation

## 2023-03-22 DIAGNOSIS — R079 Chest pain, unspecified: Secondary | ICD-10-CM | POA: Diagnosis not present

## 2023-03-22 LAB — URINALYSIS, ROUTINE W REFLEX MICROSCOPIC
Bilirubin Urine: NEGATIVE
Glucose, UA: NEGATIVE mg/dL
Hgb urine dipstick: NEGATIVE
Ketones, ur: 5 mg/dL — AB
Leukocytes,Ua: NEGATIVE
Nitrite: NEGATIVE
Protein, ur: NEGATIVE mg/dL
Specific Gravity, Urine: 1.009 (ref 1.005–1.030)
pH: 7 (ref 5.0–8.0)

## 2023-03-22 LAB — CBC
HCT: 39.9 % (ref 39.0–52.0)
Hemoglobin: 13.9 g/dL (ref 13.0–17.0)
MCH: 31.3 pg (ref 26.0–34.0)
MCHC: 34.8 g/dL (ref 30.0–36.0)
MCV: 89.9 fL (ref 80.0–100.0)
Platelets: 212 10*3/uL (ref 150–400)
RBC: 4.44 MIL/uL (ref 4.22–5.81)
RDW: 11.9 % (ref 11.5–15.5)
WBC: 7.1 10*3/uL (ref 4.0–10.5)
nRBC: 0 % (ref 0.0–0.2)

## 2023-03-22 LAB — COMPREHENSIVE METABOLIC PANEL
ALT: 18 U/L (ref 0–44)
AST: 19 U/L (ref 15–41)
Albumin: 3.9 g/dL (ref 3.5–5.0)
Alkaline Phosphatase: 56 U/L (ref 38–126)
Anion gap: 9 (ref 5–15)
BUN: 8 mg/dL (ref 8–23)
CO2: 24 mmol/L (ref 22–32)
Calcium: 8.8 mg/dL — ABNORMAL LOW (ref 8.9–10.3)
Chloride: 94 mmol/L — ABNORMAL LOW (ref 98–111)
Creatinine, Ser: 0.66 mg/dL (ref 0.61–1.24)
GFR, Estimated: >60 mL/min (ref >60)
Glucose, Bld: 98 mg/dL (ref 70–99)
Potassium: 3.9 mmol/L (ref 3.5–5.1)
Sodium: 127 mmol/L — ABNORMAL LOW (ref 135–145)
Total Bilirubin: 1 mg/dL (ref 0.3–1.2)
Total Protein: 6.1 g/dL — ABNORMAL LOW (ref 6.5–8.1)

## 2023-03-22 LAB — TSH: TSH: 2.778 u[IU]/mL (ref 0.350–4.500)

## 2023-03-22 LAB — MAGNESIUM: Magnesium: 2.1 mg/dL (ref 1.7–2.4)

## 2023-03-22 LAB — LIPASE, BLOOD: Lipase: 43 U/L (ref 11–51)

## 2023-03-22 LAB — TROPONIN I (HIGH SENSITIVITY): Troponin I (High Sensitivity): 5 ng/L (ref <18)

## 2023-03-22 MED ORDER — LACTATED RINGERS IV BOLUS
1000.0000 mL | Freq: Once | INTRAVENOUS | Status: AC
  Administered 2023-03-22: 1000 mL via INTRAVENOUS

## 2023-03-22 NOTE — Discharge Instructions (Addendum)
You were seen today for palpitations.  Your x-ray was reassuring as well as your EKG.  Your sodium was low at 127.  This is potentially due to dehydration.  You should call your doctor tomorrow to schedule close follow-up, you need to have your sodium rechecked in the next 2 to 3 days.  If you have nausea and vomiting, inability eat or drink, worsening palpitations, chest pain, difficulty breathing or any other new concerning symptoms you should return to the ED.

## 2023-03-22 NOTE — ED Triage Notes (Signed)
Pt came in via POV d/t feeling his chest palpitate more frequently than it usually does, Hx of PVCs (per pt), denies CP or SOB. Also reports recent abd pain that radiates from his Lt side to Lt back, plus anal leakage. a recent CT scan did not show anything. He then states he has lost 12 lbs the last year, has been feeling weak in both legs, lack of stamina, very fatigue, hot/cold flashes, denies fevers. Also reports difficulty swallowing & intermittent hoarseness since he had polyps removed from his vocal cords on 02/20/23. Pt reports has been trying to see [facility] but has not been seen yet. A/Ox4, rates overall pain 4/10 while in triage.

## 2023-03-22 NOTE — ED Provider Notes (Signed)
[place] EMERGENCY DEPARTMENT AT [facility] Provider Note   CSN: [id number] Arrival date & time: 03/22/23  1556     History  Chief Complaint  Patient presents with   Irregular Heart Beat   Abdominal Pain    Peter Drake is a 77 y.o. male.  77 year old male history of BPH, hyperlipidemia, prior PVCs, radiation for cancerous vocal cord polyp, GERD presenting for palpitations.  Patient states over the last several weeks has had unintentional weight loss, intermittent generalized abdominal pain, somewhat poor appetite.  Today he has experienced approximately 4 heart palpitations per minute for the last few hours.  Started while sitting at home and resting.  Has not had any chest pain or difficulty breathing.  No dizziness or syncope.  Has had no change in his diet.  No vomiting or diarrhea.  No fever or chills.  He otherwise feels well.  He states he recently had a vocal polyp removed with ENT, has had some hoarseness and difficulty swallowing since then but is able to swallow.  He is having regular bowel meds but occasionally has some anal leakage.  He saw his PCP last week, they did a CT scan of his abdomen given his ongoing pain which was unremarkable.  Currently has no abdominal pain.  He is trying to get in with GI.  Reports prior fungal esophagitis, he had no symptoms at the time and was treated earlier this year after EGD that found it.   Abdominal Pain Associated symptoms: fatigue        Home Medications Prior to Admission medications   Medication Sig Start Date End Date Taking? Authorizing Provider  acetaminophen (TYLENOL) 500 MG tablet Take 1,000 mg by mouth 2 (two) times daily as needed for moderate pain.    [provider]  docusate sodium (COLACE) 100 MG capsule Take 1 capsule (100 mg total) by mouth 2 (two) times daily. 05/07/22   Cassandria Anger, PA-C  fluticasone (FLONASE) 50 MCG/ACT nasal spray Place 2 sprays into both nostrils at bedtime.     [provider]  gabapentin (NEURONTIN) 100 MG capsule Take 100 mg by mouth daily as needed (pain).    [provider]  HYDROcodone-acetaminophen (NORCO/VICODIN) 5-325 MG tablet Take 1 tablet by mouth every 4 (four) hours as needed for moderate pain. Patient not taking: Reported on 10/23/2022 09/25/22 09/25/23  Sherryl Manges, NP  pantoprazole (PROTONIX) 40 MG tablet Take 40 mg by mouth daily before breakfast.    [provider]  Probiotic Product (PROBIOTIC PO) Take by mouth.    [provider]  simvastatin (ZOCOR) 20 MG tablet Take 20 mg by mouth every evening.    [provider]  Tamsulosin HCl (FLOMAX) 0.4 MG CAPS Take 0.4 mg by mouth 2 (two) times daily.     [provider]  tiZANidine (ZANAFLEX) 4 MG tablet Take 1 tablet (4 mg total) by mouth 3 (three) times daily as needed (Pain). 09/25/22   Meyran, Tiana Loft, NP  traMADol (ULTRAM) 50 MG tablet Take 1 tablet (50 mg total) by mouth every 6 (six) hours as needed. 10/28/22 10/28/23  Axel Filler, MD  traZODone (DESYREL) 50 MG tablet Take 50 mg by mouth at bedtime. 11/25/21   [provider]      Allergies    Penicillins    Review of Systems   Review of Systems  Constitutional:  Positive for fatigue and unexpected weight change.  Cardiovascular:  Positive for palpitations.  Physical Exam Updated Vital Signs BP (!) 131/99   Pulse (!) 55   Temp 98.7 F (37.1 C)   Resp 13   SpO2 100%  Physical Exam Vitals and nursing note reviewed.  Constitutional:      General: He is not in acute distress.    Appearance: He is well-developed.  HENT:     Head: Normocephalic and atraumatic.     Nose: Nose normal.     Mouth/Throat:     Mouth: Mucous membranes are moist.     Pharynx: Oropharynx is clear. No pharyngeal swelling or oropharyngeal exudate.  Eyes:     Extraocular Movements: Extraocular movements intact.     Conjunctiva/sclera: Conjunctivae normal.     Pupils:  Pupils are equal, round, and reactive to light.  Cardiovascular:     Rate and Rhythm: Normal rate and regular rhythm.     Heart sounds: No murmur heard. Pulmonary:     Effort: Pulmonary effort is normal. No respiratory distress.     Breath sounds: Normal breath sounds.  Abdominal:     Palpations: Abdomen is soft.     Tenderness: There is no abdominal tenderness. There is no right CVA tenderness, left CVA tenderness, guarding or rebound.  Musculoskeletal:        General: No swelling.     Cervical back: Neck supple.     Right lower leg: No edema.     Left lower leg: No edema.  Lymphadenopathy:     Cervical: No cervical adenopathy.  Skin:    General: Skin is warm and dry.     Capillary Refill: Capillary refill takes less than 2 seconds.  Neurological:     General: No focal deficit present.     Mental Status: He is alert and oriented to person, place, and time. Mental status is at baseline.  Psychiatric:        Mood and Affect: Mood normal.     ED Results / Procedures / Treatments   Labs (all labs ordered are listed, but only abnormal results are displayed) Labs Reviewed  COMPREHENSIVE METABOLIC PANEL - Abnormal; Notable for the following components:      Result Value   Sodium 127 (*)    Chloride 94 (*)    Calcium 8.8 (*)    Total Protein 6.1 (*)    All other components within normal limits  URINALYSIS, ROUTINE W REFLEX MICROSCOPIC - Abnormal; Notable for the following components:   Ketones, ur 5 (*)    All other components within normal limits  LIPASE, BLOOD  CBC  MAGNESIUM  TSH  TROPONIN I (HIGH SENSITIVITY)    EKG EKG Interpretation  Date/Time:  Sunday March 22 2023 18:58:21 EDT Ventricular Rate:  53 PR Interval:  152 QRS Duration: 103 QT Interval:  423 QTC Calculation: 398 R Axis:   -21 Text Interpretation: Sinus rhythm Atrial premature complexes in couplets Borderline left axis deviation Abnormal R-wave progression, early transition Confirmed by Glyn Ade [phone]) on 03/22/2023 7:00:04 PM  Radiology DG Chest 2 View  Result Date: 03/22/2023 CLINICAL DATA:  Chest pain EXAM: CHEST - 2 VIEW COMPARISON:  None Available. FINDINGS: The heart size and mediastinal contours are within normal limits. Both lungs are clear. The visualized skeletal structures are unremarkable. IMPRESSION: No active cardiopulmonary disease. Electronically Signed   By: Gerome Sam III M.D.   On: 03/22/2023 17:25    Procedures Procedures    Medications Ordered in ED Medications  lactated ringers bolus 1,000 mL (1,000 mLs  Intravenous New Bag/Given 03/22/23 1921)    ED Course/ Medical Decision Making/ A&P Clinical Course as of 03/22/23 1955  Sun Mar 22, 2023  1719 Stable 76 YOM with abdominal pain, weight loss and palpitations Well appearing IVF and palpitations workup. [CC]  1900 Evaluated personally at bedside.  Mr. Kashmere is presenting with palpitations, poor p.o. intake, malaise fatigue.  Diagnostic valuation at this time is negative.  Repeat EKG remains nondiagnostic.  Will place patient on telemetry for short time while getting IV fluids sodium mildly decreased likely volume related. Will plan for symptomatic reassessment after observation window.  If still asymptomatic by 9 PM, patient will be stable for outpatient care management follow-up with his primary cardiologist/gastroenterologist/PCP. [CC]  1909   EKG sinus rhythm, normal intervals.  I do not see any PVC or PAC.  No signs of ischemia or arrhythmia. [JD]    Clinical Course User Index [CC] Glyn Ade, MD [JD] Fulton Reek, MD                             Medical Decision Making Amount and/or Complexity of Data Reviewed Labs: ordered. Radiology: ordered.   77 year old male presenting for palpitations.  Vital signs reviewed notable for mild hypertension.  On exam he is well-appearing.  He reports 1 day of palpitations.  He has had some recent unintentional weight loss and slight decreased  p.o. intake associate with abdominal pain.  However he had a CT scan several days ago which was unremarkable his abdomen, and has no abdominal pain and benign abdominal exam here.  Do not think he needs repeat imaging.  Differential including dehydration, arrhythmia, electro abnormality, renal dysfunction, thyroid abnormality.  No clinical signs of infection.  He has no chest pain, lower concern for ACS.  Will screen with troponin and EKG.  EKG reviewed, unremarkable without signs of ischemia or arrhythmia.  I reviewed his telemetry, I do not see any concerning episodes.  He is bradycardic but is asymptomatic.  After IV fluids he has no more palpitations and is feeling better.  Could be component of dehydration.  TSH, knees and, troponin are all unremarkable.  CBC and lipase normal.  CMP notable for sodium 127.  He states his most recent sodium was 133.  Suspect could be volume related.  He has no symptoms related to his hyponatremia.  Urinalysis is unremarkable.  Given that he is asymptomatic and has been stable for several hours in the ED, I think he is stable for outpatient follow-up.  He is able to follow-up closely with his doctor and can call tomorrow to schedule follow-up and to recheck his sodium.  I given strict return precautions for any new or worsening symptoms.  He was discharged in stable condition.        Final Clinical Impression(s) / ED Diagnoses Final diagnoses:  Palpitations    Rx / DC Orders ED Discharge Orders     None         Fulton Reek, MD 03/22/23 Bluford Main, MD 03/22/23 2241

## 2023-03-24 ENCOUNTER — Other Ambulatory Visit: Payer: Self-pay

## 2023-03-24 ENCOUNTER — Emergency Department (HOSPITAL_COMMUNITY)
Admission: EM | Admit: 2023-03-24 | Discharge: 2023-03-24 | Disposition: A | Discharge status: Home / Self Care | Payer: Medicare PPO | Attending: Emergency Medicine | Admitting: Emergency Medicine

## 2023-03-24 ENCOUNTER — Emergency Department (HOSPITAL_COMMUNITY): Payer: Medicare PPO

## 2023-03-24 ENCOUNTER — Encounter (HOSPITAL_COMMUNITY): Payer: Self-pay

## 2023-03-24 DIAGNOSIS — R002 Palpitations: Secondary | ICD-10-CM

## 2023-03-24 DIAGNOSIS — E871 Hypo-osmolality and hyponatremia: Secondary | ICD-10-CM

## 2023-03-24 LAB — CBC
HCT: 40.9 % (ref 39.0–52.0)
Hemoglobin: 14 g/dL (ref 13.0–17.0)
MCH: 31.2 pg (ref 26.0–34.0)
MCHC: 34.2 g/dL (ref 30.0–36.0)
MCV: 91.1 fL (ref 80.0–100.0)
Platelets: 228 10*3/uL (ref 150–400)
RBC: 4.49 MIL/uL (ref 4.22–5.81)
RDW: 12.2 % (ref 11.5–15.5)
WBC: 7.2 10*3/uL (ref 4.0–10.5)
nRBC: 0 % (ref 0.0–0.2)

## 2023-03-24 LAB — BASIC METABOLIC PANEL
Anion gap: 9 (ref 5–15)
BUN: 12 mg/dL (ref 8–23)
CO2: 23 mmol/L (ref 22–32)
Calcium: 8.9 mg/dL (ref 8.9–10.3)
Chloride: 97 mmol/L — ABNORMAL LOW (ref 98–111)
Creatinine, Ser: 0.65 mg/dL (ref 0.61–1.24)
GFR, Estimated: >60 mL/min (ref >60)
Glucose, Bld: 124 mg/dL — ABNORMAL HIGH (ref 70–99)
Potassium: 3.9 mmol/L (ref 3.5–5.1)
Sodium: 129 mmol/L — ABNORMAL LOW (ref 135–145)

## 2023-03-24 LAB — TROPONIN I (HIGH SENSITIVITY): Troponin I (High Sensitivity): 3 ng/L (ref <18)

## 2023-03-24 NOTE — Discharge Instructions (Signed)
Your workup today was overall reassuring.  Dr. Excell Seltzer your cardiologist reached out and stated that your lab work and everything from heart standpoint is reassuring and he will schedule an appointment for you to be seen Friday morning where they will place a heart monitor on for you.  Follow-up with your primary care doctor as scheduled tomorrow.  If you have any worsening symptoms please return to the emergency room.

## 2023-03-24 NOTE — ED Triage Notes (Signed)
Pt c/o heart palpitations and weaknessx2d. Pt denies SOB, chest pain, N/V.

## 2023-03-24 NOTE — ED Provider Notes (Signed)
[place] EMERGENCY DE[place]MENT AT [facility] Provider Note   CSN: [id number] Arrival date & time: 03/24/23  1543     History  Chief Complaint  Patient presents with   Palpitations    Peter Drake is a 77 y.o. male.  77 year old male history of BPH, hyperlipidemia, prior PVCs, radiation for cancerous vocal cord polyp, GERD presenting for palpitations.  He was recently evaluated for the same 2 days ago.  He states since then he has not had any palpitations until today.  He states he had about 8 palpitations in about 1 minute.  Multiple times.  Denies any chest pain, shortness of breath.  Overall he is well-appearing.  Does endorse some longstanding GI complaints.  But no acute worsening in abdominal pain.  He has follow-up scheduled with his PCP for tomorrow.  He was found to have low sodium level during recent visit which she was due to have repeated tomorrow.  The history is provided by the patient. No language interpreter was used.       Home Medications Prior to Admission medications   Medication Sig Start Date End Date Taking? Authorizing Provider  acetaminophen (TYLENOL) 500 MG tablet Take 1,000 mg by mouth 2 (two) times daily as needed for moderate pain.    [provider]  docusate sodium (COLACE) 100 MG capsule Take 1 capsule (100 mg total) by mouth 2 (two) times daily. 05/07/22   Cassandria Anger, PA-C  fluticasone (FLONASE) 50 MCG/ACT nasal spray Place 2 sprays into both nostrils at bedtime.    [provider]  gabapentin (NEURONTIN) 100 MG capsule Take 100 mg by mouth daily as needed (pain).    [provider]  HYDROcodone-acetaminophen (NORCO/VICODIN) 5-325 MG tablet Take 1 tablet by mouth every 4 (four) hours as needed for moderate pain. Patient not taking: Reported on 10/23/2022 09/25/22 09/25/23  Sherryl Manges, NP  pantoprazole (PROTONIX) 40 MG tablet Take 40 mg by mouth daily before breakfast.    [provider]   Probiotic Product (PROBIOTIC PO) Take by mouth.    [provider]  simvastatin (ZOCOR) 20 MG tablet Take 20 mg by mouth every evening.    [provider]  Tamsulosin HCl (FLOMAX) 0.4 MG CAPS Take 0.4 mg by mouth 2 (two) times daily.     [provider]  tiZANidine (ZANAFLEX) 4 MG tablet Take 1 tablet (4 mg total) by mouth 3 (three) times daily as needed (Pain). 09/25/22   Meyran, Tiana Loft, NP  traMADol (ULTRAM) 50 MG tablet Take 1 tablet (50 mg total) by mouth every 6 (six) hours as needed. 10/28/22 10/28/23  Axel Filler, MD  traZODone (DESYREL) 50 MG tablet Take 50 mg by mouth at bedtime. 11/25/21   [provider]      Allergies    Penicillins    Review of Systems   Review of Systems  Constitutional:  Negative for chills and fever.  Respiratory:  Negative for shortness of breath.   Cardiovascular:  Positive for palpitations. Negative for chest pain and leg swelling.  Gastrointestinal:  Negative for abdominal pain, nausea and vomiting.  Genitourinary:  Negative for difficulty urinating.  Neurological:  Negative for light-headedness.  All other systems reviewed and are negative.   Physical Exam Updated Vital Signs BP 118/88   Pulse 70   Temp 98.9 F (37.2 C) (Oral)   Resp 16   Ht 5\' 9"  (1.753 m)   Wt 66.7 kg   SpO2 98%  BMI 21.72 kg/m  Physical Exam Vitals and nursing note reviewed.  Constitutional:      General: He is not in acute distress.    Appearance: Normal appearance. He is not ill-appearing.  HENT:     Head: Normocephalic and atraumatic.     Nose: Nose normal.  Eyes:     General: No scleral icterus.    Extraocular Movements: Extraocular movements intact.     Conjunctiva/sclera: Conjunctivae normal.  Cardiovascular:     Rate and Rhythm: Normal rate and regular rhythm.     Pulses: Normal pulses.     Heart sounds: Normal heart sounds.  Pulmonary:     Effort: Pulmonary effort is normal. No respiratory distress.      Breath sounds: Normal breath sounds. No wheezing or rales.  Abdominal:     General: There is no distension.     Palpations: Abdomen is soft.     Tenderness: There is no abdominal tenderness. There is no guarding.  Musculoskeletal:        General: Normal range of motion.     Cervical back: Normal range of motion.     Right lower leg: No edema.     Left lower leg: No edema.  Skin:    General: Skin is warm and dry.  Neurological:     General: No focal deficit present.     Mental Status: He is alert. Mental status is at baseline.     ED Results / Procedures / Treatments   Labs (all labs ordered are listed, but only abnormal results are displayed) Labs Reviewed  BASIC METABOLIC PANEL - Abnormal; Notable for the following components:      Result Value   Sodium 129 (*)    Chloride 97 (*)    Glucose, Bld 124 (*)    All other components within normal limits  CBC  TROPONIN I (HIGH SENSITIVITY)  TROPONIN I (HIGH SENSITIVITY)    EKG None  Radiology DG Chest 2 View  Result Date: 03/24/2023 CLINICAL DATA:  palpitations EXAM: CHEST - 2 VIEW COMPARISON:  CXR 03/22/23 FINDINGS: The heart size and mediastinal contours are within normal limits. Both lungs are clear. The visualized skeletal structures are unremarkable. Bilateral shoulder arthroplasties. IMPRESSION: No active pulmonary disease. Electronically Signed   By: Lorenza Cambridge M.D.   On: 03/24/2023 16:49    Procedures Procedures    Medications Ordered in ED Medications - No data to display  ED Course/ Medical Decision Making/ A&P                             Medical Decision Making Amount and/or Complexity of Data Reviewed Labs: ordered. Radiology: ordered.   Medical Decision Making / ED Course   This patient presents to the ED for concern of palpitations, this involves an extensive number of treatment options, and is a complaint that carries with it a high risk of complications and morbidity.  The differential diagnosis  includes PVC, PAC, A-fib, other arrhythmia  MDM: 77 year old male presents today for above-mentioned complaints.  Overall he is well-appearing.  Hemodynamically stable.  Was found to have sodium level of 127 during recent visit.  Normal resident exam.  EKG without acute ischemic changes.  Will obtain labs, chest x-ray.  While patient was in the waiting room awaiting workup cardiologist Dr. Excell Seltzer reached out to me.  Stating he sees patient in the outpatient setting.  He has reviewed the patient's workup so far  including troponin, CBC, BMP, chest x-ray, and EKG.  He states these are all stable.  He states sodium is slightly improved.  He has follow-up with PCP tomorrow.  Dr. Excell Seltzer will arrange for cardiology follow-up on Friday to place Holter monitor for patient.  He feels from a cardiology standpoint patient is stable for discharge.  Patient has no other new complaints.  This option was presented to the patient.  Patient would prefer to be discharged and follow-up outpatient with his PCP and cardiologist.  Strict return precautions given.  Patient is appropriate for discharge.  Discharged in stable condition.  They voiced understanding and are in agreement with plan.   Additional history obtained: -Additional history obtained from recent ED visit. -External records from outside source obtained and reviewed including: Chart review including previous notes, labs, imaging, consultation notes   Lab Tests: -I ordered, reviewed, and interpreted labs.   The pertinent results include:   Labs Reviewed  BASIC METABOLIC PANEL - Abnormal; Notable for the following components:      Result Value   Sodium 129 (*)    Chloride 97 (*)    Glucose, Bld 124 (*)    All other components within normal limits  CBC  TROPONIN I (HIGH SENSITIVITY)  TROPONIN I (HIGH SENSITIVITY)      EKG  EKG Interpretation  Date/Time:    Ventricular Rate:    PR Interval:    QRS Duration:   QT Interval:    QTC  Calculation:   R Axis:     Text Interpretation:           Imaging Studies ordered: I ordered imaging studies including cxr I independently visualized and interpreted imaging. I agree with the radiologist interpretation   Medicines ordered and prescription drug management: No orders of the defined types were placed in this encounter.   -I have reviewed the patients home medicines and have made adjustments as needed Consultations Obtained: I requested consultation with the Dr. Excell Seltzer (cardiologist),  and discussed lab and imaging findings as well as pertinent plan - they recommend: as above   Co morbidities that complicate the patient evaluation  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  Return precaution discussed.  Patient voices understanding and is in agreement with plan.  Wife is at bedside and is in agreement as well.  Final Clinical Impression(s) / ED Diagnoses Final diagnoses:  Palpitations  Hyponatremia  Rx / DC Orders ED Discharge Orders          Ordered    Ambulatory referral to Cardiology       Comments:  If you have not heard from the Cardiology office within the next 72 hours please call [phone].   03/24/23 1732              Marita Kansas, PA-C 03/24/23 1740    Benjiman Core, MD 03/24/23 2302

## 2023-03-24 NOTE — ED Provider Triage Note (Signed)
Emergency Medicine Provider Triage Evaluation Note  Peter Drake , a 77 y.o. male  was evaluated in triage.  Pt complains of palpitations.  He was recently seen for this and he states after IV hydration his symptoms had resolved.  His sodium was low and was told to have this rechecked tomorrow by his PCP.  Prior to being able to follow-up with his PCP he states his palpitations returned.  He states within a minute he has about 8 or 10 events.  Denies any chest pain, shortness of breath.  Does note some GI plaints which have been going on for some time.  No other complaints..  Review of Systems  Positive: As above Negative: As above  Physical Exam  BP 118/88   Pulse 70   Temp 98.9 F (37.2 C) (Oral)   Resp 16   Ht 5\' 9"  (1.753 m)   Wt 66.7 kg   SpO2 98%   BMI 21.72 kg/m  Gen:   Awake, no distress   Resp:  Normal effort  MSK:   Moves extremities without difficulty  Other:    Medical Decision Making  Medically screening exam initiated at 4:12 PM.  Appropriate orders placed.  Dayna Ramus was informed that the remainder of the evaluation will be completed by another provider, this initial triage assessment does not replace that evaluation, and the importance of remaining in the ED until their evaluation is complete.     Marita Kansas, PA-C 03/24/23 [phone]

## 2023-03-25 DIAGNOSIS — R002 Palpitations: Secondary | ICD-10-CM | POA: Diagnosis not present

## 2023-03-25 DIAGNOSIS — Z0000 Encounter for general adult medical examination without abnormal findings: Secondary | ICD-10-CM | POA: Diagnosis not present

## 2023-03-25 DIAGNOSIS — R159 Full incontinence of feces: Secondary | ICD-10-CM | POA: Diagnosis not present

## 2023-03-25 DIAGNOSIS — E871 Hypo-osmolality and hyponatremia: Secondary | ICD-10-CM | POA: Diagnosis not present

## 2023-03-25 DIAGNOSIS — G479 Sleep disorder, unspecified: Secondary | ICD-10-CM | POA: Diagnosis not present

## 2023-03-25 DIAGNOSIS — I491 Atrial premature depolarization: Secondary | ICD-10-CM | POA: Diagnosis not present

## 2023-03-25 DIAGNOSIS — R194 Change in bowel habit: Secondary | ICD-10-CM | POA: Diagnosis not present

## 2023-03-25 DIAGNOSIS — R109 Unspecified abdominal pain: Secondary | ICD-10-CM | POA: Diagnosis not present

## 2023-03-25 DIAGNOSIS — R634 Abnormal weight loss: Secondary | ICD-10-CM | POA: Diagnosis not present

## 2023-03-26 ENCOUNTER — Telehealth: Payer: Self-pay

## 2023-03-26 NOTE — Telephone Encounter (Signed)
Pt has an appt scheduled tomorrow with Dr. Excell Seltzer. His appt notes were updated to show he needs preop clearance. I will share with Dr. Excell Seltzer

## 2023-03-26 NOTE — Telephone Encounter (Signed)
   Name: KRAVEN COOPRIDER  DOB: 07-09-1946  MRN: [id number]  Primary Cardiologist: Tonny Bollman, MD  Chart reviewed as part of pre-operative protocol coverage. Because of Jaxan Kitchens Earnest's past medical history and time since last visit, he will require a follow-up in-office visit in order to better assess preoperative cardiovascular risk.  Pre-op covering staff: - Please schedule appointment and call patient to inform them. If patient already had an upcoming appointment within acceptable timeframe, please add "pre-op clearance" to the appointment notes so provider is aware. - Please contact requesting surgeon's office via preferred method (i.e, phone, fax) to inform them of need for appointment prior to surgery.  No medications indicated as needing held.  Sharlene Dory, PA-C  03/26/2023, 8:48 AM

## 2023-03-26 NOTE — Telephone Encounter (Signed)
   Pre-operative Risk Assessment    Patient Name: Peter Drake  DOB: 1945-12-09 MRN: [id number]      Request for Surgical Clearance    Procedure:   Colonoscopy  Date of Surgery:  Clearance 04/15/23                                 Surgeon:  Dr. Kathi Der Surgeon's Group or Practice Name:  [facility] Phone number:  [phone] Fax number:  [phone]   Type of Clearance Requested:   - Medical    Type of Anesthesia:   Propofo;   Additional requests/questions:    Signed, Zada Finders   03/26/2023, 7:20 AM

## 2023-03-27 ENCOUNTER — Encounter: Payer: Self-pay | Admitting: Cardiovascular Disease

## 2023-03-27 ENCOUNTER — Ambulatory Visit (INDEPENDENT_AMBULATORY_CARE_PROVIDER_SITE_OTHER): Payer: Medicare PPO

## 2023-03-27 ENCOUNTER — Ambulatory Visit: Payer: Medicare PPO | Attending: Cardiovascular Disease | Admitting: Cardiovascular Disease

## 2023-03-27 VITALS — BP 110/80 | HR 71 | Ht 69.0 in | Wt 141.2 lb

## 2023-03-27 DIAGNOSIS — R002 Palpitations: Secondary | ICD-10-CM

## 2023-03-27 NOTE — Progress Notes (Signed)
Cardiology Office Note:    Date:  03/27/2023   ID:  Peter Drake, DOB 10/01/1946, MRN [id number]  PCP:  Blair Heys, MD   [facility] Providers Cardiologist:  Tonny Bollman, MD     Referring MD: Blair Heys, MD   Chief Complaint  Patient presents with   Palpitations    History of Present Illness:    Peter Drake is a 77 y.o. male presenting for follow-up evaluation. The patient is here with his wife today. He's had a lot of problems recently and has had 2 recent ER visits for heart palpitations. I saw him initially in December 2023 for preop cardiac evaluation and he was noted to have a possible inferior MI pattern on his EKG. He has no hx of cardiac problems. An echo at that time showed normal LV function, no wall motion abnormalities, and he was reassured regarding his cardiac status. However, recently he has developed a lot of heart palpitations, feeling frequent skipped beats. He hasn't had DOE, chest pain, edema, lightheadedness, or syncope. His ER visits are reviewed and he has PAC's documented. Troponin labs have been normal.   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   PVC (premature ventricular contraction)    > 20 years, occasional PVCs on monitor, no specific treatment recommended by cardiology then (as of 09/16/22)ported   Radiation 08/04/08-09/20/08   Larynx   33 treatments   Sleep apnea    borderline no cpap  needed    Past Surgical History:  Procedure Laterality Date   ANTERIOR CERVICAL DECOMP/DISCECTOMY FUSION N/A 09/24/2022   Procedure: ANTERIOR CERVICAL DISCECTOMY FUSION CERVICAL THREE - CERVICAL FOUR, CERVICAL FOUR - CERVICAL FIVE, CERVICAL FIVE - CERVICAL SIX;  Surgeon: Tia Alert, MD;  Location: [facility];  Service: Neurosurgery;  Laterality: N/A;   basal cell excisions     by dr. hall   CERVICAL SPINE SURGERY     COLONOSCOPY W/ BIOPSIES AND POLYPECTOMY     Hx: of   COLONOSCOPY WITH PROPOFOL N/A 12/23/2018   Procedure: COLONOSCOPY WITH PROPOFOL;  Surgeon: Charna Elizabeth, MD;  Location: [facility];  Service: Endoscopy;  Laterality: N/A;   ESOPHAGOGASTRODUODENOSCOPY (EGD) WITH PROPOFOL N/A 12/23/2018   Procedure: ESOPHAGOGASTRODUODENOSCOPY (EGD) WITH PROPOFOL;  Surgeon: Charna Elizabeth, MD;  Location: [facility];  Service: Endoscopy;  Laterality: N/A;   EYE SURGERY     bil with implant, remove cataracts   FINGER SURGERY     right fifth   INGUINAL HERNIA REPAIR  03/2005   left    INGUINAL HERNIA REPAIR Right 10/28/2022   Procedure: LAPAROSCOPIC RIGHT INGUINAL HERNIA REPAIR WITH MESH XL 5X7 3DMAX;  Surgeon: Axel Filler, MD;  Location: [facility] OR;  Service: General;  Laterality: Right;   left knee arthoscopy  x 2   MICROLARYNGOSCOPY WITH CO2 LASER AND EXCISION OF VOCAL CORD LESION Bilateral 02/20/2023   Procedure: MICROLARYNGOSCOPY WITH CO2 LASER AND EXCISION OF VOCAL CORD LESION;  Surgeon: Christia Reading, MD;  Location: [facility];  Service: ENT;  Laterality: Bilateral;   MOHS SURGERY     x3   NASAL SINUS SURGERY  1990's   polyp removal     squamous cell  33 radiation treatments vocal cord   POLYPECTOMY  12/23/2018   Procedure: POLYPECTOMY;  Surgeon: Charna Elizabeth, MD;  Location: [facility];  Service: Endoscopy;;   RADIAL KERATOTOMY     20 years ago/ bi lateral   TONSILLECTOMY     as a child   TOTAL HIP ARTHROPLASTY Left 05/06/2022   Procedure: TOTAL HIP  ARTHROPLASTY ANTERIOR APPROACH;  Surgeon: Durene Romans, MD;  Location: [facility];  Service: Orthopedics;  Laterality: Left;   TOTAL SHOULDER ARTHROPLASTY Right 11/03/2013   DR SUPPLE   TOTAL SHOULDER ARTHROPLASTY Right 11/02/2013   Procedure: RIGHT TOTAL SHOULDER ARTHROPLASTY;  Surgeon: Senaida Lange, MD;  Location: [facility] OR;  Service: Orthopedics;  Laterality: Right;   TOTAL SHOULDER ARTHROPLASTY Left 10/04/2020   Procedure: TOTAL SHOULDER ARTHROPLASTY;  Surgeon: Francena Hanly, MD;  Location: [facility];  Service: Orthopedics;  Laterality: Left;     Current Medications: Current Meds  Medication Sig   acetaminophen (TYLENOL) 500 MG tablet Take 1,000 mg by mouth 2 (two) times daily as needed for moderate pain.   docusate sodium (COLACE) 100 MG capsule Take 1 capsule (100 mg total) by mouth 2 (two) times daily.   fluticasone (FLONASE) 50 MCG/ACT nasal spray Place 2 sprays into both nostrils as needed.   gabapentin (NEURONTIN) 100 MG capsule Take 100 mg by mouth daily as needed (pain).   pantoprazole (PROTONIX) 40 MG tablet Take 40 mg by mouth 2 (two) times daily.   Probiotic Product (PROBIOTIC PO) Take by mouth.   simvastatin (ZOCOR) 20 MG tablet Take 20 mg by mouth every evening.   Tamsulosin HCl (FLOMAX) 0.4 MG CAPS Take 0.4 mg by mouth 2 (two) times daily.    tiZANidine (ZANAFLEX) 4 MG tablet Take 1 tablet (4 mg total) by mouth 3 (three) times daily as needed (Pain).   traZODone (DESYREL) 50 MG tablet Take 50 mg by mouth at bedtime. Per patient may take twice a day.     Allergies:   Penicillins   Social History   Socioeconomic History   Marital status: Married    Spouse name: Not on file   Number of children: Not on file   Years of education: Not on file   Highest education level: Not on file  Occupational History   Occupation: FIRE CHIEF    Employer: RETIRED   Occupation: emergency medical service staff     Comment: part time  Tobacco Use   Smoking status: Former     Packs/day: 1.00    Years: 15.00    Additional pack years: 0.00    Total pack years: 15.00    Types: Cigarettes    Quit date: 1980    Years since quitting: 44.4   Smokeless tobacco: Former    Types: Chew    Quit date: 1998   Tobacco comments:    quit smoking 1980,then smokless tobaco until 1998  Vaping Use   Vaping Use: Never used  Substance and Sexual Activity   Alcohol use: Yes    Alcohol/week: 4.0 standard drinks of alcohol    Types:  4 Cans of beer per week   Drug use: No    Comment:  smokeless 20 years after cigarettees for 2 y   Sexual activity: Not Currently    Birth control/protection: None  Other Topics Concern   Not on file  Social History Narrative   Not on file   Social Determinants of Health   Financial Resource Strain: Not on file  Food Insecurity: Not on file  Transportation Needs: Not on file  Physical Activity: Not on file  Stress: Not on file  Social Connections: Not on file     Family History: The patient's family history includes COPD in his father; Cancer in his sister; Heart disease in his mother; Melanoma (age of onset: 24) in his daughter; Rectal cancer in his sister.  ROS:   Please see the history of present illness.    Positive for anxiety, weight loss, GI symptoms. All other systems reviewed and are negative.  EKGs/Labs/Other Studies Reviewed:    The following studies were reviewed today: Cardiac Studies & Procedures       ECHOCARDIOGRAM  ECHOCARDIOGRAM COMPLETE 10/14/2022  Narrative ECHOCARDIOGRAM REPORT    Patient Name:   DARNAY SCHWAMBERGER Date of Exam: 10/14/2022 Medical Rec #:  [id number]        Height:       69.0 in Accession #:    [id number]       Weight:       160.0 lb Date of Birth:  1946-02-28        BSA:          1.879 m Patient Age:    76 years         BP:           136/90 mmHg Patient Gender: M                HR:           74 bpm. Exam Location:  [address]  Procedure: 2D Echo, Cardiac Doppler, Color Doppler, 3D  Echo and Strain Analysis  Indications:    Abnormal ECG R94.31  History:        Patient has prior history of Echocardiogram examinations, most recent 12/19/2019. Arrythmias:PVC; Risk Factors:Dyslipidemia.  Sonographer:    Thurman Coyer RDCS Referring Phys: [phone] Salinda Snedeker  IMPRESSIONS   1. Left ventricular ejection fraction, by estimation, is 60 to 65%. Left ventricular ejection fraction by 3D volume is 60 %. The left ventricle has normal function. The left ventricle has no regional wall motion abnormalities. Left ventricular diastolic parameters are consistent with Grade I diastolic dysfunction (impaired relaxation). The average left ventricular global longitudinal strain is -20.7 %. The global longitudinal strain is normal. 2. Right ventricular systolic function is normal. The right ventricular size is normal. Tricuspid regurgitation signal is inadequate for assessing PA pressure. 3. The mitral valve is normal in structure. Trivial mitral valve regurgitation. 4. The aortic valve is tricuspid. There is mild calcification of the aortic valve. There is mild thickening of the aortic valve. Aortic valve regurgitation is mild. Aortic valve sclerosis/calcification is present, without any evidence of aortic stenosis. 5. Aortic dilatation noted. There is borderline dilatation of the ascending aorta, measuring 37 mm. 6. The inferior vena cava is dilated in size with >50% respiratory variability, suggesting right atrial pressure of 8 mmHg.  Comparison(s): Compared to prior TTE in 12/2019, there is now mild AR. Otherwise, no significant change.  FINDINGS Left Ventricle: Left ventricular ejection fraction, by estimation, is 60  to 65%. Left ventricular ejection fraction by 3D volume is 60 %. The left ventricle has normal function. The left ventricle has no regional wall motion abnormalities. The average left ventricular global longitudinal strain is -20.7 %. The global longitudinal strain is  normal. The left ventricular internal cavity size was normal in size. There is no left ventricular hypertrophy. Left ventricular diastolic parameters are consistent with Grade I diastolic dysfunction (impaired relaxation).  Right Ventricle: The right ventricular size is normal. No increase in right ventricular wall thickness. Right ventricular systolic function is normal. Tricuspid regurgitation signal is inadequate for assessing PA pressure.  Left Atrium: Left atrial size was normal in size.  Right Atrium: Right atrial size was normal in size.  Pericardium: There is no evidence of pericardial effusion.  Mitral Valve: The mitral valve is normal in structure. There is mild thickening of the mitral valve leaflet(s). Mild mitral annular calcification. Trivial mitral valve regurgitation.  Tricuspid Valve: The tricuspid valve is normal in structure. Tricuspid valve regurgitation is trivial.  Aortic Valve: The aortic valve is tricuspid. There is mild calcification of the aortic valve. There is mild thickening of the aortic valve. Aortic valve regurgitation is mild. Aortic valve sclerosis/calcification is present, without any evidence of aortic stenosis.  Pulmonic Valve: The pulmonic valve was normal in structure. Pulmonic valve regurgitation is trivial.  Aorta: Aortic dilatation noted. There is borderline dilatation of the ascending aorta, measuring 37 mm.  Venous: The inferior vena cava is dilated in size with greater than 50% respiratory variability, suggesting right atrial pressure of 8 mmHg.  IAS/Shunts: The atrial septum is grossly normal.   LEFT VENTRICLE PLAX 2D LVIDd:         4.20 cm         Diastology LVIDs:         2.60 cm         LV e' medial:    4.90 cm/s LV PW:         1.00 cm         LV E/e' medial:  12.2 LV IVS:        1.00 cm         LV e' lateral:   10.10 cm/s LVOT diam:     2.20 cm         LV E/e' lateral: 5.9 LV SV:         59 LV SV Index:   31              2D LVOT  Area:     3.80 cm        Longitudinal Strain 2D Strain GLS  -20.7 % Avg:  3D Volume EF LV 3D EF:    Left ventricul ar ejection fraction by 3D volume is 60 %.  3D Volume EF: 3D EF:        60 % LV EDV:       128 ml LV ESV:       51 ml LV SV:        77 ml  RIGHT VENTRICLE RV Basal diam:  2.50 cm RV S prime:     15.10 cm/s TAPSE (M-mode): 1.8 cm  LEFT ATRIUM             Index        RIGHT ATRIUM           Index LA diam:        4.30 cm 2.29 cm/m   RA Area:  10.70 cm LA Vol (A2C):   58.8 ml 31.29 ml/m  RA Volume:   17.00 ml  9.05 ml/m LA Vol (A4C):   56.4 ml 30.01 ml/m LA Biplane Vol: 58.6 ml 31.19 ml/m AORTIC VALVE LVOT Vmax:   87.50 cm/s LVOT Vmean:  54.500 cm/s LVOT VTI:    0.155 m  AORTA Ao Root diam: 3.50 cm Ao Asc diam:  3.70 cm  MITRAL VALVE MV Area (PHT): 2.99 cm    SHUNTS MV Decel Time: 254 msec    Systemic VTI:  0.16 m MV E velocity: 59.70 cm/s  Systemic Diam: 2.20 cm MV A velocity: 82.80 cm/s MV E/A ratio:  0.72  Laurance Flatten MD Electronically signed by Laurance Flatten MD Signature Date/Time: 10/14/2022/10:53:52 AM    Final              EKG:  EKG is not ordered today.    Recent Labs: 03/22/2023: ALT 18; Magnesium 2.1; TSH 2.778 03/24/2023: BUN 12; Creatinine, Ser 0.65; Hemoglobin 14.0; Platelets 228; Potassium 3.9; Sodium 129  Recent Lipid Panel No results found for: "CHOL", "TRIG", "HDL", "CHOLHDL", "VLDL", "LDLCALC", "LDLDIRECT"   Risk Assessment/Calculations:                Physical Exam:    VS:  BP 110/80   Pulse 71   Ht 5\' 9"  (1.753 m)   Wt 141 lb 3.2 oz (64 kg)   SpO2 98%   BMI 20.85 kg/m     Wt Readings from Last 3 Encounters:  03/27/23 141 lb 3.2 oz (64 kg)  03/24/23 147 lb 0.8 oz (66.7 kg)  02/20/23 147 lb 0.8 oz (66.7 kg)     GEN:  Well nourished, well developed thin male in no acute distress HEENT: Normal NECK: No JVD; No carotid bruits LYMPHATICS: No lymphadenopathy CARDIAC: RRR, no murmurs,  rubs, gallops RESPIRATORY:  Clear to auscultation without rales, wheezing or rhonchi  ABDOMEN: Soft, non-tender, non-distended MUSCULOSKELETAL:  No edema; No deformity  SKIN: Warm and dry NEUROLOGIC:  Alert and oriented x 3 PSYCHIATRIC:  Normal affect   ASSESSMENT:    1. Palpitations    PLAN:    In order of problems listed above:  Structurally normal heart by echo assessment in the past year. ER data reviewed and PAC's seen on EKG without any other significant findings. I have recommended outpatient monitoring with a 7 day ZIO monitor. Other data reviewed includes recent lab work. I discussed symptomatic treatment of heart palpitations. Mostly he is reassured about this today. I don't think he would tolerate a beta-blocker well. He is very anxious to move forward with colonoscopy to evaluate his GI symptoms and I am clearing him from a cardiac perspective. He is at low risk of cardiac complications. I will see him back in 6 months for follow-up and will contact him once his monitor is completed.            Medication Adjustments/Labs and Tests Ordered: Current medicines are reviewed at length with the patient today.  Concerns regarding medicines are outlined above.  Orders Placed This Encounter  Procedures   LONG TERM MONITOR (3-14 DAYS)   No orders of the defined types were placed in this encounter.   Patient Instructions  Medication Instructions:  Your physician recommends that you continue on your current medications as directed. Please refer to the Current Medication list given to you today.  *If you need a refill on your cardiac medications before your next appointment, please call your  pharmacy*  Lab Work: NONE If you have labs (blood work) drawn today and your tests are completely normal, you will receive your results only by: MyChart Message (if you have MyChart) OR A paper copy in the mail If you have any lab test that is abnormal or we need to change your  treatment, we will call you to review the results.  Testing/Procedures: 7-day zio monitor Your physician has recommended that you wear an event monitor. Event monitors are medical devices that record the heart's electrical activity. Doctors most often [place] these monitors to diagnose arrhythmias. Arrhythmias are problems with the speed or rhythm of the heartbeat. The monitor is a small, portable device. You can wear one while you do your normal daily activities. This is usually used to diagnose what is causing palpitations/syncope (passing out).  Follow-Up: At [facility], you and your health needs are our priority.  As part of our continuing mission to provide you with exceptional heart care, we have created designated Provider Care Teams.  These Care Teams include your primary Cardiologist (physician) and Advanced Practice Providers (APPs -  Physician Assistants and Nurse Practitioners) who all work together to provide you with the care you need, when you need it.  Your next appointment:   6 month(s)  Provider:   Tonny Bollman, MD        Signed, Tonny Bollman, MD  03/27/2023 11:39 AM    [facility]

## 2023-03-27 NOTE — Progress Notes (Unsigned)
Applied a 7 day Zio XT monitor to patient in the office 

## 2023-03-27 NOTE — Patient Instructions (Signed)
Medication Instructions:  Your physician recommends that you continue on your current medications as directed. Please refer to the Current Medication list given to you today.  *If you need a refill on your cardiac medications before your next appointment, please call your pharmacy*  Lab Work: NONE If you have labs (blood work) drawn today and your tests are completely normal, you will receive your results only by: MyChart Message (if you have MyChart) OR A paper copy in the mail If you have any lab test that is abnormal or we need to change your treatment, we will call you to review the results.  Testing/Procedures: 7-day zio monitor Your physician has recommended that you wear an event monitor. Event monitors are medical devices that record the heart's electrical activity. Doctors most often [place] these monitors to diagnose arrhythmias. Arrhythmias are problems with the speed or rhythm of the heartbeat. The monitor is a small, portable device. You can wear one while you do your normal daily activities. This is usually used to diagnose what is causing palpitations/syncope (passing out).  Follow-Up: At [facility], you and your health needs are our priority.  As part of our continuing mission to provide you with exceptional heart care, we have created designated Provider Care Teams.  These Care Teams include your primary Cardiologist (physician) and Advanced Practice Providers (APPs -  Physician Assistants and Nurse Practitioners) who all work together to provide you with the care you need, when you need it.  Your next appointment:   6 month(s)  Provider:   Tonny Bollman, MD

## 2023-04-02 ENCOUNTER — Other Ambulatory Visit: Payer: Self-pay | Admitting: Student

## 2023-04-02 DIAGNOSIS — M5459 Other low back pain: Secondary | ICD-10-CM | POA: Diagnosis not present

## 2023-04-02 DIAGNOSIS — Z96642 Presence of left artificial hip joint: Secondary | ICD-10-CM | POA: Diagnosis not present

## 2023-04-02 DIAGNOSIS — M21371 Foot drop, right foot: Secondary | ICD-10-CM | POA: Diagnosis not present

## 2023-04-02 DIAGNOSIS — M25552 Pain in left hip: Secondary | ICD-10-CM | POA: Diagnosis not present

## 2023-04-06 ENCOUNTER — Ambulatory Visit
Admission: RE | Admit: 2023-04-06 | Discharge: 2023-04-06 | Disposition: A | Discharge status: Home / Self Care | Payer: Medicare PPO | Source: Ambulatory Visit | Attending: Student | Admitting: Student

## 2023-04-06 ENCOUNTER — Other Ambulatory Visit: Payer: Medicare PPO

## 2023-04-06 DIAGNOSIS — M5136 Other intervertebral disc degeneration, lumbar region: Secondary | ICD-10-CM | POA: Diagnosis not present

## 2023-04-06 DIAGNOSIS — M47896 Other spondylosis, lumbar region: Secondary | ICD-10-CM | POA: Diagnosis not present

## 2023-04-06 DIAGNOSIS — M5459 Other low back pain: Secondary | ICD-10-CM

## 2023-04-07 ENCOUNTER — Other Ambulatory Visit: Payer: Medicare PPO

## 2023-04-07 DIAGNOSIS — R002 Palpitations: Secondary | ICD-10-CM | POA: Diagnosis not present

## 2023-04-09 ENCOUNTER — Encounter: Payer: Self-pay | Admitting: Cardiovascular Disease

## 2023-04-10 DIAGNOSIS — M5451 Vertebrogenic low back pain: Secondary | ICD-10-CM | POA: Diagnosis not present

## 2023-04-10 DIAGNOSIS — M21371 Foot drop, right foot: Secondary | ICD-10-CM | POA: Diagnosis not present

## 2023-04-15 DIAGNOSIS — D124 Benign neoplasm of descending colon: Secondary | ICD-10-CM | POA: Diagnosis not present

## 2023-04-15 DIAGNOSIS — R194 Change in bowel habit: Secondary | ICD-10-CM | POA: Diagnosis not present

## 2023-04-15 DIAGNOSIS — K5730 Diverticulosis of large intestine without perforation or abscess without bleeding: Secondary | ICD-10-CM | POA: Diagnosis not present

## 2023-04-15 DIAGNOSIS — K635 Polyp of colon: Secondary | ICD-10-CM | POA: Diagnosis not present

## 2023-04-15 DIAGNOSIS — D122 Benign neoplasm of ascending colon: Secondary | ICD-10-CM | POA: Diagnosis not present

## 2023-04-17 DIAGNOSIS — Z9889 Other specified postprocedural states: Secondary | ICD-10-CM | POA: Diagnosis not present

## 2023-04-17 DIAGNOSIS — Z8719 Personal history of other diseases of the digestive system: Secondary | ICD-10-CM | POA: Diagnosis not present

## 2023-04-17 DIAGNOSIS — R1031 Right lower quadrant pain: Secondary | ICD-10-CM | POA: Diagnosis not present

## 2023-04-21 ENCOUNTER — Other Ambulatory Visit (HOSPITAL_COMMUNITY): Payer: Self-pay | Admitting: Student

## 2023-04-21 DIAGNOSIS — R1031 Right lower quadrant pain: Secondary | ICD-10-CM

## 2023-04-21 DIAGNOSIS — Z8719 Personal history of other diseases of the digestive system: Secondary | ICD-10-CM

## 2023-04-22 DIAGNOSIS — K635 Polyp of colon: Secondary | ICD-10-CM | POA: Diagnosis not present

## 2023-04-22 DIAGNOSIS — D124 Benign neoplasm of descending colon: Secondary | ICD-10-CM | POA: Diagnosis not present

## 2023-04-22 DIAGNOSIS — D122 Benign neoplasm of ascending colon: Secondary | ICD-10-CM | POA: Diagnosis not present

## 2023-04-25 ENCOUNTER — Ambulatory Visit (HOSPITAL_BASED_OUTPATIENT_CLINIC_OR_DEPARTMENT_OTHER)
Admission: RE | Admit: 2023-04-25 | Discharge: 2023-04-25 | Disposition: A | Discharge status: Home / Self Care | Payer: Medicare PPO | Source: Ambulatory Visit | Attending: Student | Admitting: Student

## 2023-04-25 DIAGNOSIS — Z9889 Other specified postprocedural states: Secondary | ICD-10-CM | POA: Insufficient documentation

## 2023-04-25 DIAGNOSIS — R1030 Lower abdominal pain, unspecified: Secondary | ICD-10-CM | POA: Diagnosis not present

## 2023-04-25 DIAGNOSIS — Z8719 Personal history of other diseases of the digestive system: Secondary | ICD-10-CM | POA: Diagnosis not present

## 2023-04-25 DIAGNOSIS — N3289 Other specified disorders of bladder: Secondary | ICD-10-CM | POA: Diagnosis not present

## 2023-04-25 DIAGNOSIS — K5730 Diverticulosis of large intestine without perforation or abscess without bleeding: Secondary | ICD-10-CM | POA: Diagnosis not present

## 2023-04-25 DIAGNOSIS — N400 Enlarged prostate without lower urinary tract symptoms: Secondary | ICD-10-CM | POA: Diagnosis not present

## 2023-04-25 DIAGNOSIS — R1031 Right lower quadrant pain: Secondary | ICD-10-CM | POA: Insufficient documentation

## 2023-04-26 ENCOUNTER — Ambulatory Visit (HOSPITAL_BASED_OUTPATIENT_CLINIC_OR_DEPARTMENT_OTHER): Payer: Medicare PPO

## 2023-04-27 ENCOUNTER — Telehealth: Payer: Self-pay | Admitting: Cardiovascular Disease

## 2023-04-27 NOTE — Telephone Encounter (Signed)
  Patient is returning call from Peter Drake regarding monitor results. Patient states that he understands and is pleased with the results and does not have any questions.

## 2023-04-30 DIAGNOSIS — M5416 Radiculopathy, lumbar region: Secondary | ICD-10-CM | POA: Diagnosis not present

## 2023-05-01 DIAGNOSIS — M21371 Foot drop, right foot: Secondary | ICD-10-CM | POA: Diagnosis not present

## 2023-05-07 DIAGNOSIS — R1031 Right lower quadrant pain: Secondary | ICD-10-CM | POA: Diagnosis not present

## 2023-05-07 DIAGNOSIS — N138 Other obstructive and reflux uropathy: Secondary | ICD-10-CM | POA: Diagnosis not present

## 2023-05-07 DIAGNOSIS — N401 Enlarged prostate with lower urinary tract symptoms: Secondary | ICD-10-CM | POA: Diagnosis not present

## 2023-05-12 DIAGNOSIS — M21371 Foot drop, right foot: Secondary | ICD-10-CM | POA: Diagnosis not present

## 2023-05-14 DIAGNOSIS — M5416 Radiculopathy, lumbar region: Secondary | ICD-10-CM | POA: Diagnosis not present

## 2023-05-27 DIAGNOSIS — M21371 Foot drop, right foot: Secondary | ICD-10-CM | POA: Diagnosis not present

## 2023-06-01 DIAGNOSIS — M545 Low back pain, unspecified: Secondary | ICD-10-CM | POA: Diagnosis not present

## 2023-06-01 DIAGNOSIS — M5416 Radiculopathy, lumbar region: Secondary | ICD-10-CM | POA: Diagnosis not present

## 2023-06-01 DIAGNOSIS — M21371 Foot drop, right foot: Secondary | ICD-10-CM | POA: Diagnosis not present

## 2023-06-01 DIAGNOSIS — Z96642 Presence of left artificial hip joint: Secondary | ICD-10-CM | POA: Diagnosis not present

## 2023-06-08 DIAGNOSIS — Z85828 Personal history of other malignant neoplasm of skin: Secondary | ICD-10-CM | POA: Diagnosis not present

## 2023-06-08 DIAGNOSIS — D692 Other nonthrombocytopenic purpura: Secondary | ICD-10-CM | POA: Diagnosis not present

## 2023-06-08 DIAGNOSIS — L570 Actinic keratosis: Secondary | ICD-10-CM | POA: Diagnosis not present

## 2023-06-08 DIAGNOSIS — L821 Other seborrheic keratosis: Secondary | ICD-10-CM | POA: Diagnosis not present

## 2023-06-08 DIAGNOSIS — L218 Other seborrheic dermatitis: Secondary | ICD-10-CM | POA: Diagnosis not present

## 2023-06-08 DIAGNOSIS — L853 Xerosis cutis: Secondary | ICD-10-CM | POA: Diagnosis not present

## 2023-06-08 DIAGNOSIS — D1801 Hemangioma of skin and subcutaneous tissue: Secondary | ICD-10-CM | POA: Diagnosis not present

## 2023-06-10 DIAGNOSIS — M21371 Foot drop, right foot: Secondary | ICD-10-CM | POA: Diagnosis not present

## 2023-06-12 DIAGNOSIS — Z8521 Personal history of malignant neoplasm of larynx: Secondary | ICD-10-CM | POA: Diagnosis not present

## 2023-06-12 DIAGNOSIS — G43109 Migraine with aura, not intractable, without status migrainosus: Secondary | ICD-10-CM | POA: Diagnosis not present

## 2023-06-12 DIAGNOSIS — E871 Hypo-osmolality and hyponatremia: Secondary | ICD-10-CM | POA: Diagnosis not present

## 2023-06-12 DIAGNOSIS — E7800 Pure hypercholesterolemia, unspecified: Secondary | ICD-10-CM | POA: Diagnosis not present

## 2023-06-12 DIAGNOSIS — F411 Generalized anxiety disorder: Secondary | ICD-10-CM | POA: Diagnosis not present

## 2023-06-12 DIAGNOSIS — I700 Atherosclerosis of aorta: Secondary | ICD-10-CM | POA: Diagnosis not present

## 2023-06-12 DIAGNOSIS — N400 Enlarged prostate without lower urinary tract symptoms: Secondary | ICD-10-CM | POA: Diagnosis not present

## 2023-06-12 DIAGNOSIS — Z125 Encounter for screening for malignant neoplasm of prostate: Secondary | ICD-10-CM | POA: Diagnosis not present

## 2023-06-12 DIAGNOSIS — Z79899 Other long term (current) drug therapy: Secondary | ICD-10-CM | POA: Diagnosis not present

## 2023-06-12 DIAGNOSIS — Z0000 Encounter for general adult medical examination without abnormal findings: Secondary | ICD-10-CM | POA: Diagnosis not present

## 2023-06-12 LAB — LAB REPORT - SCANNED: EGFR: 97

## 2023-06-15 DIAGNOSIS — H90A32 Mixed conductive and sensorineural hearing loss, unilateral, left ear with restricted hearing on the contralateral side: Secondary | ICD-10-CM | POA: Diagnosis not present

## 2023-06-19 DIAGNOSIS — M48062 Spinal stenosis, lumbar region with neurogenic claudication: Secondary | ICD-10-CM | POA: Diagnosis not present

## 2023-06-19 DIAGNOSIS — Z96642 Presence of left artificial hip joint: Secondary | ICD-10-CM | POA: Diagnosis not present

## 2023-06-19 DIAGNOSIS — M25552 Pain in left hip: Secondary | ICD-10-CM | POA: Diagnosis not present

## 2023-06-19 DIAGNOSIS — M5136 Other intervertebral disc degeneration, lumbar region: Secondary | ICD-10-CM | POA: Diagnosis not present

## 2023-06-23 DIAGNOSIS — R634 Abnormal weight loss: Secondary | ICD-10-CM | POA: Diagnosis not present

## 2023-06-23 DIAGNOSIS — K219 Gastro-esophageal reflux disease without esophagitis: Secondary | ICD-10-CM | POA: Diagnosis not present

## 2023-06-23 DIAGNOSIS — R159 Full incontinence of feces: Secondary | ICD-10-CM | POA: Diagnosis not present

## 2023-06-23 DIAGNOSIS — R194 Change in bowel habit: Secondary | ICD-10-CM | POA: Diagnosis not present

## 2023-06-29 DIAGNOSIS — M21371 Foot drop, right foot: Secondary | ICD-10-CM | POA: Diagnosis not present

## 2023-07-02 DIAGNOSIS — M21371 Foot drop, right foot: Secondary | ICD-10-CM | POA: Diagnosis not present

## 2023-07-13 DIAGNOSIS — M21371 Foot drop, right foot: Secondary | ICD-10-CM | POA: Diagnosis not present

## 2023-07-14 DIAGNOSIS — R634 Abnormal weight loss: Secondary | ICD-10-CM | POA: Diagnosis not present

## 2023-07-14 DIAGNOSIS — F411 Generalized anxiety disorder: Secondary | ICD-10-CM | POA: Diagnosis not present

## 2023-07-15 DIAGNOSIS — H90A32 Mixed conductive and sensorineural hearing loss, unilateral, left ear with restricted hearing on the contralateral side: Secondary | ICD-10-CM | POA: Diagnosis not present

## 2023-07-16 DIAGNOSIS — M21371 Foot drop, right foot: Secondary | ICD-10-CM | POA: Diagnosis not present

## 2023-07-22 ENCOUNTER — Ambulatory Visit: Payer: Medicare PPO | Attending: Cardiovascular Disease | Admitting: Cardiovascular Disease

## 2023-07-22 ENCOUNTER — Encounter: Payer: Self-pay | Admitting: Cardiovascular Disease

## 2023-07-22 VITALS — BP 118/72 | HR 67 | Ht 69.0 in | Wt 137.6 lb

## 2023-07-22 DIAGNOSIS — R002 Palpitations: Secondary | ICD-10-CM

## 2023-07-22 NOTE — Patient Instructions (Signed)
Medication Instructions:  Your physician recommends that you continue on your current medications as directed. Please refer to the Current Medication list given to you today.  *If you need a refill on your cardiac medications before your next appointment, please call your pharmacy*  Follow-Up: At [facility], you and your health needs are our priority.  As part of our continuing mission to provide you with exceptional heart care, we have created designated Provider Care Teams.  These Care Teams include your primary Cardiologist (physician) and Advanced Practice Providers (APPs -  Physician Assistants and Nurse Practitioners) who all work together to provide you with the care you need, when you need it.  Your next appointment:   1 year  Provider:   Tonny Bollman, MD

## 2023-07-22 NOTE — Progress Notes (Signed)
Cardiology Office Note:    Date:  07/22/2023   ID:  Peter Drake, DOB 1946/04/28, MRN [id number]  PCP:  Blair Heys, MD   [facility] Providers Cardiologist:  Tonny Bollman, MD     Referring MD: Blair Heys, MD   Chief Complaint  Patient presents with   Follow-up    Palpitations    History of Present Illness:    Peter Drake is a 77 y.o. male with a hx of heart palpitations, presenting for follow-up evaluation.  The patient is here with his wife today. He has had a lot of problem with his legs including numbness and tingling. He has hip pain as well. He has been diagnosed with some spinal problems and is following closely with neurosurgery. He hasn't noticed any significant change in heart palpitations.  He denies chest pain, chest pressure, or shortness of breath.  Reports problems with sleep.    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   PVC (premature ventricular contraction)    > 20 years, occasional PVCs on monitor, no specific treatment recommended by cardiology then (as of 09/16/22)ported   Radiation 08/04/08-09/20/08   Larynx   33 treatments   Sleep apnea    borderline no cpap needed    Past Surgical History:  Procedure Laterality Date   ANTERIOR CERVICAL DECOMP/DISCECTOMY FUSION N/A 09/24/2022   Procedure: ANTERIOR CERVICAL DISCECTOMY FUSION CERVICAL THREE - CERVICAL FOUR, CERVICAL FOUR - CERVICAL FIVE,  CERVICAL FIVE - CERVICAL SIX;  Surgeon: Tia Alert, MD;  Location: [facility] OR;  Service: Neurosurgery;  Laterality: N/A;   basal cell excisions     by dr. hall   CERVICAL SPINE SURGERY     COLONOSCOPY W/ BIOPSIES AND POLYPECTOMY     Hx: of   COLONOSCOPY WITH PROPOFOL N/A 12/23/2018   Procedure: COLONOSCOPY WITH PROPOFOL;  Surgeon: Charna Elizabeth, MD;  Location: [facility];  Service: Endoscopy;  Laterality: N/A;   ESOPHAGOGASTRODUODENOSCOPY (EGD) WITH PROPOFOL N/A 12/23/2018   Procedure: ESOPHAGOGASTRODUODENOSCOPY (EGD) WITH PROPOFOL;  Surgeon: Charna Elizabeth, MD;  Location: [facility];  Service: Endoscopy;  Laterality: N/A;   EYE SURGERY     bil with implant, remove cataracts   FINGER SURGERY     right fifth   INGUINAL HERNIA REPAIR  03/2005   left    INGUINAL HERNIA REPAIR Right 10/28/2022   Procedure: LAPAROSCOPIC RIGHT INGUINAL HERNIA REPAIR WITH MESH XL 5X7 3DMAX;  Surgeon: Axel Filler, MD;  Location: [facility] OR;  Service: General;  Laterality: Right;   left knee arthoscopy     x 2   MICROLARYNGOSCOPY WITH CO2 LASER AND EXCISION OF VOCAL CORD LESION Bilateral 02/20/2023   Procedure: MICROLARYNGOSCOPY WITH CO2 LASER AND EXCISION OF VOCAL CORD LESION;  Surgeon: Christia Reading, MD;  Location: [facility];  Service: ENT;  Laterality: Bilateral;   MOHS SURGERY     x3   NASAL SINUS SURGERY  1990's   polyp removal     squamous cell  33 radiation treatments vocal cord   POLYPECTOMY  12/23/2018   Procedure: POLYPECTOMY;  Surgeon: Charna Elizabeth, MD;  Location: [facility];  Service: Endoscopy;;   RADIAL KERATOTOMY     20 years ago/ bi lateral   TONSILLECTOMY     as a child   TOTAL HIP ARTHROPLASTY Left 05/06/2022   Procedure: TOTAL HIP ARTHROPLASTY ANTERIOR APPROACH;  Surgeon: Durene Romans, MD;  Location: [facility];  Service: Orthopedics;  Laterality: Left;   TOTAL SHOULDER ARTHROPLASTY Right 11/03/2013   DR SUPPLE   TOTAL SHOULDER ARTHROPLASTY Right 11/02/2013   Procedure:  RIGHT TOTAL SHOULDER ARTHROPLASTY;  Surgeon: Senaida Lange, MD;  Location: [facility] OR;  Service: Orthopedics;  Laterality: Right;   TOTAL SHOULDER ARTHROPLASTY Left 10/04/2020   Procedure: TOTAL SHOULDER ARTHROPLASTY;  Surgeon: Francena Hanly, MD;  Location: [facility];  Service: Orthopedics;  Laterality: Left;     Current Medications: Current Meds  Medication Sig   acetaminophen (TYLENOL) 500 MG tablet Take 1,000 mg by mouth 2 (two) times daily as needed for moderate pain.   docusate sodium (COLACE) 100 MG capsule Take 1 capsule (100 mg total) by mouth 2 (two) times daily.   fluticasone (FLONASE) 50 MCG/ACT nasal spray Place 2 sprays into both nostrils as needed.   gabapentin (NEURONTIN) 100 MG capsule Take 100 mg by mouth daily as needed (pain).   pantoprazole (PROTONIX) 40 MG tablet Take 40 mg by mouth 2 (two) times daily.   Probiotic Product (PROBIOTIC PO) Take by mouth.   simvastatin (ZOCOR) 20 MG tablet Take 20 mg by mouth every evening.   Tamsulosin HCl (FLOMAX) 0.4 MG CAPS Take 0.4 mg by mouth 2 (two) times daily.    tiZANidine (ZANAFLEX) 4 MG tablet Take 1 tablet (4 mg total) by mouth 3 (three) times daily as needed (Pain).   traZODone (DESYREL) 50 MG tablet Take 50 mg by mouth at bedtime. Per patient may take twice a day.     Allergies:   Penicillins   Social History   Socioeconomic History   Marital status: Married    Spouse name: Not on file   Number of children: Not on file   Years of education: Not on file   Highest education level: Not on file  Occupational History   Occupation: FIRE CHIEF    Employer: RETIRED   Occupation: emergency medical service staff     Comment: part time  Tobacco Use   Smoking status: Former    Current packs/day: 0.00    Average packs/day: 1 pack/day for 15.0 years (15.0 ttl pk-yrs)    Types: Cigarettes    Start date: 50    Quit date: 1980    Years since quitting: 44.7   Smokeless tobacco: Former    Types: Chew    Quit date: 1998    Tobacco comments:    quit smoking 1980,then smokless tobaco until 1998  Vaping Use   Vaping status: Never Used  Substance and Sexual Activity   Alcohol use: Yes    Alcohol/week: 4.0 standard drinks of alcohol    Types: 4 Cans of beer per week   Drug use: No    Comment:  smokeless 20 years after cigarettees for 71 y   Sexual activity: Not Currently    Birth  control/protection: None  Other Topics Concern   Not on file  Social History Narrative   Not on file   Social Determinants of Health   Financial Resource Strain: Not on file  Food Insecurity: Not on file  Transportation Needs: Not on file  Physical Activity: Not on file  Stress: Not on file  Social Connections: Not on file     Family History: The patient's family history includes COPD in his father; Cancer in his sister; Heart disease in his mother; Melanoma (age of onset: 39) in his daughter; Rectal cancer in his sister.  ROS:   Positive for weight loss. Please see the history of present illness.    All other systems reviewed and are negative.  EKGs/Labs/Other Studies Reviewed:    The following studies were reviewed today: Event Monitor: SUMMARY: The basic rhythm is normal sinus with an average HR of 64 bpm No atrial fibrillation or flutter No high-grade heart block or pathologic pauses There are rare PVC's and occasional supraventricular beats without sustained arrhythmias. Short supraventricular runs noted, but rates only with modest increase average 115 bpm   Overall benign monitor result     Echo 09/2022:  1. Left ventricular ejection fraction, by estimation, is 60 to 65%. Left  ventricular ejection fraction by 3D volume is 60 %. The left ventricle has  normal function. The left ventricle has no regional wall motion  abnormalities. Left ventricular diastolic   parameters are consistent with Grade I diastolic dysfunction (impaired  relaxation). The average left ventricular global longitudinal strain is   -20.7 %. The global longitudinal strain is normal.   2. Right ventricular systolic function is normal. The right ventricular  size is normal. Tricuspid regurgitation signal is inadequate for assessing  PA pressure.   3. The mitral valve is normal in structure. Trivial mitral valve  regurgitation.   4. The aortic valve is tricuspid. There is mild calcification of the  aortic valve. There is mild thickening of the aortic valve. Aortic valve  regurgitation is mild. Aortic valve sclerosis/calcification is present,  without any evidence of aortic  stenosis.   5. Aortic dilatation noted. There is borderline dilatation of the  ascending aorta, measuring 37 mm.   6. The inferior vena cava is dilated in size with >50% respiratory  variability, suggesting right atrial pressure of 8 mmHg.   Comparison(s): Compared to prior TTE in 12/2019, there is now mild AR.  Otherwise, no significant change.    Recent Labs: 03/22/2023: ALT 18; Magnesium 2.1; TSH 2.778 03/24/2023: BUN 12; Creatinine, Ser 0.65; Hemoglobin 14.0; Platelets 228; Potassium 3.9; Sodium 129  Recent Lipid Panel No results found for: "CHOL", "TRIG", "HDL", "CHOLHDL", "VLDL", "LDLCALC", "LDLDIRECT"   Risk Assessment/Calculations:                Physical Exam:    VS:  BP 118/72   Pulse 67   Ht 5\' 9"  (1.753 m)   Wt 137 lb 9.6 oz (62.4 kg)   SpO2 98%   BMI 20.32 kg/m     Wt Readings from Last 3 Encounters:  07/22/23 137 lb 9.6 oz (62.4 kg)  03/27/23 141 lb 3.2 oz (64 kg)  03/24/23 147 lb 0.8 oz (66.7 kg)     GEN:  Well nourished, well developed thin male in no acute distress HEENT: Normal NECK: No JVD; No carotid bruits LYMPHATICS: No lymphadenopathy CARDIAC: RRR, no murmurs, rubs, gallops RESPIRATORY:  Clear to auscultation without rales, wheezing or rhonchi  ABDOMEN: Soft, non-tender,  non-distended MUSCULOSKELETAL:  No edema; No deformity  SKIN: Warm and dry NEUROLOGIC:  Alert and oriented x 3 PSYCHIATRIC:   Normal affect   ASSESSMENT:    1. Palpitations    PLAN:    In order of problems listed above:  Overall the patient appears clinically stable from a cardiac perspective.  He is having quite a few noncardiac problems outlined above.  Most significantly are back and hip pain as well as weight loss.  From a cardiac standpoint, he is having occasional heart palpitations which are unchanged over time.  We found that he was having symptomatic PACs based on previous monitoring.  Continue observation and I plan to see him back in 1 year for follow-up evaluation.  Heart is structurally normal based on echo assessment.           Medication Adjustments/Labs and Tests Ordered: Current medicines are reviewed at length with the patient today.  Concerns regarding medicines are outlined above.  No orders of the defined types were placed in this encounter.  No orders of the defined types were placed in this encounter.   Patient Instructions  Medication Instructions:  Your physician recommends that you continue on your current medications as directed. Please refer to the Current Medication list given to you today.  *If you need a refill on your cardiac medications before your next appointment, please call your pharmacy*  Follow-Up: At [facility], you and your health needs are our priority.  As part of our continuing mission to provide you with exceptional heart care, we have created designated Provider Care Teams.  These Care Teams include your primary Cardiologist (physician) and Advanced Practice Providers (APPs -  Physician Assistants and Nurse Practitioners) who all work together to provide you with the care you need, when you need it.   Your next appointment:   1 year  Provider:   Tonny Bollman, MD       Signed, Tonny Bollman, MD  07/22/2023 1:05 PM    [facility]

## 2023-07-27 DIAGNOSIS — Z961 Presence of intraocular lens: Secondary | ICD-10-CM | POA: Diagnosis not present

## 2023-07-27 DIAGNOSIS — H52203 Unspecified astigmatism, bilateral: Secondary | ICD-10-CM | POA: Diagnosis not present

## 2023-07-31 DIAGNOSIS — M25552 Pain in left hip: Secondary | ICD-10-CM | POA: Diagnosis not present

## 2023-07-31 DIAGNOSIS — M48062 Spinal stenosis, lumbar region with neurogenic claudication: Secondary | ICD-10-CM | POA: Diagnosis not present

## 2023-07-31 DIAGNOSIS — M51362 Other intervertebral disc degeneration, lumbar region with discogenic back pain and lower extremity pain: Secondary | ICD-10-CM | POA: Diagnosis not present

## 2023-08-03 DIAGNOSIS — N401 Enlarged prostate with lower urinary tract symptoms: Secondary | ICD-10-CM | POA: Diagnosis not present

## 2023-08-03 DIAGNOSIS — R351 Nocturia: Secondary | ICD-10-CM | POA: Diagnosis not present

## 2023-08-03 DIAGNOSIS — N138 Other obstructive and reflux uropathy: Secondary | ICD-10-CM | POA: Diagnosis not present

## 2023-08-03 DIAGNOSIS — R109 Unspecified abdominal pain: Secondary | ICD-10-CM | POA: Diagnosis not present

## 2023-08-05 DIAGNOSIS — R194 Change in bowel habit: Secondary | ICD-10-CM | POA: Diagnosis not present

## 2023-08-05 DIAGNOSIS — R634 Abnormal weight loss: Secondary | ICD-10-CM | POA: Diagnosis not present

## 2023-08-05 DIAGNOSIS — R159 Full incontinence of feces: Secondary | ICD-10-CM | POA: Diagnosis not present

## 2023-08-05 DIAGNOSIS — K219 Gastro-esophageal reflux disease without esophagitis: Secondary | ICD-10-CM | POA: Diagnosis not present

## 2023-08-10 DIAGNOSIS — M5416 Radiculopathy, lumbar region: Secondary | ICD-10-CM | POA: Diagnosis not present

## 2023-08-10 DIAGNOSIS — M51362 Other intervertebral disc degeneration, lumbar region with discogenic back pain and lower extremity pain: Secondary | ICD-10-CM | POA: Diagnosis not present

## 2023-08-13 DIAGNOSIS — Z23 Encounter for immunization: Secondary | ICD-10-CM | POA: Diagnosis not present

## 2023-08-13 DIAGNOSIS — F411 Generalized anxiety disorder: Secondary | ICD-10-CM | POA: Diagnosis not present

## 2023-08-15 DIAGNOSIS — M5416 Radiculopathy, lumbar region: Secondary | ICD-10-CM | POA: Diagnosis not present

## 2023-08-18 DIAGNOSIS — Z9622 Myringotomy tube(s) status: Secondary | ICD-10-CM | POA: Diagnosis not present

## 2023-08-18 DIAGNOSIS — H6240 Otitis externa in other diseases classified elsewhere, unspecified ear: Secondary | ICD-10-CM | POA: Diagnosis not present

## 2023-08-18 DIAGNOSIS — B369 Superficial mycosis, unspecified: Secondary | ICD-10-CM | POA: Diagnosis not present

## 2023-08-18 DIAGNOSIS — R490 Dysphonia: Secondary | ICD-10-CM | POA: Diagnosis not present

## 2023-08-26 DIAGNOSIS — M25552 Pain in left hip: Secondary | ICD-10-CM | POA: Diagnosis not present

## 2023-08-26 DIAGNOSIS — M51362 Other intervertebral disc degeneration, lumbar region with discogenic back pain and lower extremity pain: Secondary | ICD-10-CM | POA: Diagnosis not present

## 2023-08-26 DIAGNOSIS — M48062 Spinal stenosis, lumbar region with neurogenic claudication: Secondary | ICD-10-CM | POA: Diagnosis not present

## 2023-09-08 DIAGNOSIS — H6240 Otitis externa in other diseases classified elsewhere, unspecified ear: Secondary | ICD-10-CM | POA: Diagnosis not present

## 2023-09-08 DIAGNOSIS — B369 Superficial mycosis, unspecified: Secondary | ICD-10-CM | POA: Diagnosis not present

## 2023-09-08 DIAGNOSIS — Z9622 Myringotomy tube(s) status: Secondary | ICD-10-CM | POA: Diagnosis not present

## 2023-09-10 DIAGNOSIS — M5416 Radiculopathy, lumbar region: Secondary | ICD-10-CM | POA: Diagnosis not present

## 2023-09-24 DIAGNOSIS — T887XXA Unspecified adverse effect of drug or medicament, initial encounter: Secondary | ICD-10-CM | POA: Diagnosis not present

## 2023-09-24 DIAGNOSIS — F411 Generalized anxiety disorder: Secondary | ICD-10-CM | POA: Diagnosis not present

## 2023-09-30 ENCOUNTER — Ambulatory Visit: Payer: Medicare PPO | Admitting: Cardiovascular Disease

## 2023-10-12 DIAGNOSIS — M5416 Radiculopathy, lumbar region: Secondary | ICD-10-CM | POA: Diagnosis not present

## 2023-11-04 ENCOUNTER — Other Ambulatory Visit: Payer: Self-pay | Admitting: Neurological Surgery

## 2023-11-04 DIAGNOSIS — M21372 Foot drop, left foot: Secondary | ICD-10-CM

## 2023-11-08 NOTE — Progress Notes (Unsigned)
[facility]  PATIENT: Peter Drake DOB: 03/22/1946  REFERRING DOCTOR OR PCP: Marikay Alar, MD SOURCE: Patient, notes from neurosurgery, imaging and neurophysiology results, MRI images personally reviewed.  _________________________________   HISTORICAL  CHIEF COMPLAINT:  Chief Complaint  Patient presents with   Room 11    Pt is here with his Wife. Pt states that his foot drop started June 2024 and pt had a MRI and EMG done.    HISTORY OF PRESENT ILLNESS:  I had the pleasure of seeing your patient, Peter Drake, at [facility] for neurologic consultation regarding his foot drop since 2024.  He is a 78 yo man with a right foot since June 2024.  He would slap his foot but did not have a complete foot drop.  He did worse ats the day wore on.     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  There was significant foraminal narrowing that could affect the left L3 nerve root at L2-L3 and bilateral L4 and the right L5 nerve roots at L4-L5 and bilateral L5 nerve roots at L5-S1.   NCV/EMG did not show peroneal neuropathy and L5 innervated muscles were normal on EMG.     He had an [facility] October 2024 with a benefit of less pain.    The left foot started to worsen 06/2023 and now is worse than the right foot.   and he saw Dr. Ethelene Hal and then Dr. Lorenso Courier.    Besides the foot drop he has mild weakness in the proximal leg.   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    He has mild numbness and tingling in his left > right feet.      He has lost about 35 pounds in the last year.  He has not noted muscle twitching and mild myalgia.      Besides the significant lumbar degenerative change, he has cervical spine degenerative change.  He had C3-C6 ACDF 09/19/2022 (Dr. Yetta Barre) and he had previously had C6-C7.  He has a little urinary dribbling but does have BPH.   He has nocturia, worse in last year.   He has had some fecal incontinence, often after  he  has a BM.     Imaging: MRI of the lumbar spine 04/06/2023 showed multilevel degenerative changes.:  L1-L2, right disc protrusion causing lateral recess stenosis.  No spinal stenosis or definite nerve root compression.  At L2-L3, there was mild to moderate foraminal narrowing, moderate right lateral recess stenosis and moderately severe left lateral recess with some potential for left L3 nerve root involvement.  At L3-L4, there is mild spinal stenosis, mild foraminal narrowing moderate bilateral lateral recess stenosis.  This impinges upon the L4 nerve roots without causing definite nerve root compression.  At L4-L5, there is moderately severe bilateral foraminal narrowing and right lateral recess stenosis and moderate left lateral recess stenosis.  Potential for bilateral L4 and right L5 nerve root compression.  At L5-S1, there is moderately severe bilateral foraminal narrowing and mild lateral recess stenosis.  Potential for L5 nerve root compression  MRI cervical spine 07/20/2022 showed moderate spinal stenosis (AP diameter 6.7 mm) at C3-C4 associated with moderate right greater than left foraminal narrowing.  There is moderate spinal stenosis (AP diameter 7.8 mm) at C4-C5 associated with moderate right and moderately severe left foraminal narrowing that could affect the C5 nerve roots.  There is mild foraminal narrowing at C5-C6.  ACDF at C6-C7 with no  foraminal narrowing at this level.  Mild foraminal narrowing at C7-T1.   07/02/2023 NCV/EMG showed near normal peroneal /tibial motor responses.   EMG of TA and fibularis longus muscles were normal.    REVIEW OF SYSTEMS: Constitutional: No fevers, chills, sweats, or change in appetite Eyes: No visual changes, double vision, eye pain Ear, nose and throat: No hearing loss, ear pain, nasal congestion, sore throat Cardiovascular: No chest pain, palpitations Respiratory:  No shortness of breath at rest or with exertion.   No wheezes GastrointestinaI: No  nausea, vomiting, diarrhea, abdominal pain, fecal incontinence Genitourinary:  No dysuria, urinary retention or frequency.  No nocturia. Musculoskeletal:  No neck pain, back pain Integumentary: No rash, pruritus, skin lesions Neurological: as above Psychiatric: No depression at this time.  No anxiety Endocrine: No palpitations, diaphoresis, change in appetite, change in weigh or increased thirst Hematologic/Lymphatic:  No anemia, purpura, petechiae. Allergic/Immunologic: No itchy/runny eyes, nasal congestion, recent allergic reactions, rashes  ALLERGIES: Allergies  Allergen Reactions   Penicillins Hives    Did it involve swelling of the face/tongue/throat, SOB, or low BP? Unknown Did it involve sudden or severe rash/hives, skin peeling, or any reaction on the inside of your mouth or nose? Unknown Did you need to seek medical attention at a hospital or doctor's office? Unknown When did it last happen?      Childhood allergy If all above answers are "NO", may proceed with cephalosporin use. Patient tolerated ancef administration without adverse reaction.    HOME MEDICATIONS:  Current Outpatient Medications:    acetaminophen (TYLENOL) 500 MG tablet, Take 1,000 mg by mouth 2 (two) times daily as needed for moderate pain., Disp: , Rfl:    ALPRAZolam (XANAX) 0.5 MG tablet, Take 2 to 3 pills po before MRI, Disp: 3 tablet, Rfl: 0   citalopram (CELEXA) 20 MG tablet, Take 20 mg by mouth daily., Disp: , Rfl:    docusate sodium (COLACE) 100 MG capsule, Take 1 capsule (100 mg total) by mouth 2 (two) times daily., Disp: 10 capsule, Rfl: 0   gabapentin (NEURONTIN) 100 MG capsule, Take 100 mg by mouth daily as needed (pain)., Disp: , Rfl:    pantoprazole (PROTONIX) 40 MG tablet, Take 40 mg by mouth 2 (two) times daily., Disp: , Rfl:    Probiotic Product (PROBIOTIC PO), Take by mouth., Disp: , Rfl:    simvastatin (ZOCOR) 20 MG tablet, Take 20 mg by mouth every evening., Disp: , Rfl:    Tamsulosin HCl  (FLOMAX) 0.4 MG CAPS, Take 0.4 mg by mouth 2 (two) times daily. , Disp: , Rfl:    tiZANidine (ZANAFLEX) 4 MG tablet, Take 1 tablet (4 mg total) by mouth 3 (three) times daily as needed (Pain)., Disp: 30 tablet, Rfl: 0   traZODone (DESYREL) 50 MG tablet, Take 50 mg by mouth at bedtime. Per patient may take twice a day., Disp: , Rfl:    fluticasone (FLONASE) 50 MCG/ACT nasal spray, Place 2 sprays into both nostrils as needed. 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   Hearing loss    History of radiation therapy 08/04/08-09/20/08   larynx   Hyperlipidemia    Neuromuscular disorder (HCC)    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   PVC (premature ventricular contraction)    > 20 years, occasional PVCs on monitor, no specific treatment recommended by cardiology then (as of 09/16/22)ported   Radiation 08/04/08-09/20/08   Larynx   33 treatments   Sleep apnea    borderline no cpap needed    PAST SURGICAL HISTORY: Past Surgical History:  Procedure Laterality Date   ANTERIOR CERVICAL DECOMP/DISCECTOMY FUSION N/A 09/24/2022   Procedure: ANTERIOR CERVICAL DISCECTOMY FUSION CERVICAL THREE - CERVICAL FOUR, CERVICAL FOUR - CERVICAL FIVE, CERVICAL FIVE - CERVICAL SIX;  Surgeon: Tia Alert, MD;  Location: [facility] OR;  Service: Neurosurgery;  Laterality: N/A;   basal cell excisions     by dr. hall   CERVICAL SPINE SURGERY     COLONOSCOPY W/ BIOPSIES AND POLYPECTOMY     Hx: of   COLONOSCOPY WITH PROPOFOL N/A 12/23/2018   Procedure: COLONOSCOPY WITH  PROPOFOL;  Surgeon: Charna Elizabeth, MD;  Location: [facility];  Service: Endoscopy;  Laterality: N/A;   ESOPHAGOGASTRODUODENOSCOPY (EGD) WITH PROPOFOL N/A 12/23/2018   Procedure: ESOPHAGOGASTRODUODENOSCOPY (EGD) WITH PROPOFOL;  Surgeon: Charna Elizabeth, MD;  Location: [facility];  Service: Endoscopy;  Laterality: N/A;   EYE SURGERY     bil with implant, remove cataracts   FINGER SURGERY     right fifth   INGUINAL HERNIA REPAIR  03/2005   left    INGUINAL HERNIA REPAIR Right 10/28/2022   Procedure: LAPAROSCOPIC RIGHT INGUINAL HERNIA REPAIR WITH MESH XL 5X7 3DMAX;  Surgeon: Axel Filler, MD;  Location: [facility] OR;  Service: General;  Laterality: Right;   left knee arthoscopy     x 2   MICROLARYNGOSCOPY WITH CO2 LASER AND EXCISION OF VOCAL CORD LESION Bilateral 02/20/2023   Procedure: MICROLARYNGOSCOPY WITH CO2 LASER AND EXCISION OF VOCAL CORD LESION;  Surgeon: Christia Reading, MD;  Location: [facility];  Service: ENT;  Laterality: Bilateral;   MOHS SURGERY     x3   NASAL SINUS SURGERY  1990's   polyp removal     squamous cell  33 radiation treatments vocal cord   POLYPECTOMY  12/23/2018   Procedure: POLYPECTOMY;  Surgeon: Charna Elizabeth, MD;  Location: [facility];  Service: Endoscopy;;   RADIAL KERATOTOMY     20 years ago/ bi lateral   TONSILLECTOMY     as a child   TOTAL HIP ARTHROPLASTY Left 05/06/2022   Procedure: TOTAL HIP ARTHROPLASTY ANTERIOR APPROACH;  Surgeon: Durene Romans, MD;  Location: [facility];  Service: Orthopedics;  Laterality: Left;   TOTAL SHOULDER ARTHROPLASTY Right 11/03/2013   DR SUPPLE   TOTAL SHOULDER ARTHROPLASTY Right 11/02/2013   Procedure: RIGHT TOTAL SHOULDER ARTHROPLASTY;  Surgeon: Senaida Lange, MD;  Location: [facility] OR;  Service: Orthopedics;  Laterality: Right;   TOTAL SHOULDER ARTHROPLASTY Left 10/04/2020   Procedure: TOTAL SHOULDER ARTHROPLASTY;  Surgeon: Francena Hanly, MD;  Location: [facility];  Service: Orthopedics;  Laterality: Left;      FAMILY HISTORY: Family History  Problem Relation Age of Onset   Heart disease Mother    COPD Father    Rectal cancer Sister    Cancer Sister  rectal   Melanoma Daughter 76       treated    SOCIAL HISTORY: Social History   Socioeconomic History   Marital status: Married    Spouse name: Not on file   Number of children: Not on file   Years of education: Not on file   Highest education level: Not on file  Occupational History   Occupation: FIRE CHIEF    Employer: RETIRED   Occupation: emergency medical service staff     Comment: part time  Tobacco Use   Smoking status: Former    Current packs/day: 0.00    Average packs/day: 1 pack/day for 15.0 years (15.0 ttl pk-yrs)    Types: Cigarettes    Start date: 35    Quit date: 1980    Years since quitting: 45.0   Smokeless tobacco: Former    Types: Chew    Quit date: 1998   Tobacco comments:    quit smoking 1980,then smokless tobaco until 1998  Vaping Use   Vaping status: Never Used  Substance and Sexual Activity   Alcohol use: Yes    Alcohol/week: 4.0 standard drinks of alcohol    Types: 4 Cans of beer per week   Drug use: No    Comment:  smokeless 20 years after cigarettees for 63 y   Sexual activity: Not Currently    Birth control/protection: None  Other Topics Concern   Not on file  Social History Narrative   Not on file   Social Drivers of Health   Financial Resource Strain: Not on file  Food Insecurity: Not on file  Transportation Needs: Not on file  Physical Activity: Not on file  Stress: Not on file  Social Connections: Not on file  Intimate Partner Violence: Not on file       PHYSICAL EXAM  Vitals:   11/10/23 1036  BP: 108/64  Pulse: 63  Weight: 135 lb (61.2 kg)  Height: 5\' 9"  (1.753 m)    Body mass index is 19.94 kg/m.   General: The patient is well-developed and well-nourished and in no acute distress  HEENT:  Head is [place]/AT.  Sclera are anicteric.  Funduscopic exam  shows normal optic discs and retinal vessels.  Neck: No carotid bruits are noted.  The neck is nontender.  Cardiovascular: The heart has a regular rate and rhythm with a normal S1 and S2. There were no murmurs, gallops or rubs.    Skin: Extremities are without rash or  edema.  Musculoskeletal:  Back is nontender  Neurologic Exam  Mental status: The patient is alert and oriented x 3 at the time of the examination. The patient has apparent normal recent and remote memory, with an apparently normal attention span and concentration ability.   Speech is normal.  Cranial nerves: Extraocular movements are full. Pupils are equal, round, and reactive to light and accomodation.  Visual fields are full.  Facial symmetry is present. There is good facial sensation to soft touch bilaterally.Facial strength is normal.  Trapezius and sternocleidomastoid strength is normal. No dysarthria is noted.  The tongue is midline, and the patient has symmetric elevation of the soft palate. No obvious hearing deficits are noted.  Motor:  Muscle bulk appeared normal in hands.  Calf was 32 cm right and 31 cm left.    Tone is normal. Strength is  5 / 5 in all arm muscles except 4+/5 right APB.  Strength was normal in right leg but 4-/5 in the left EHL muscles,  4/5 in the EDB and ankle extensor muscles.,  5/5 elsewhere  Sensory: Sensory testing is intact to pinprick, soft touch and vibration sensation in the arms.  Mild right Tinels sign at right wrist.     Normal sensation in both feet to vibration, touch and pinprick  Coordination: Cerebellar testing reveals good finger-nose-finger and heel-to-shin bilaterally.  Gait and station: Station is normal.   Gait shows light left foot dropl. Tandem gait is normal. Romberg is negative.   Reflexes: Deep tendon reflexes are symmetric and normal at the biceps, mildly increased at the triceps on the left, increased at the knees with spread on the left.  No ankle clonus.   Plantar  responses are flexor.    DIAGNOSTIC DATA (LABS, IMAGING, TESTING) - I reviewed patient records, labs, notes, testing and imaging myself where available.  Lab Results  Component Value Date   WBC 7.2 03/24/2023   HGB 14.0 03/24/2023   HCT 40.9 03/24/2023   MCV 91.1 03/24/2023   PLT 228 03/24/2023      Component Value Date/Time   NA 129 (L) 03/24/2023 1605   K 3.9 03/24/2023 1605   CL 97 (L) 03/24/2023 1605   CO2 23 03/24/2023 1605   GLUCOSE 124 (H) 03/24/2023 1605   BUN 12 03/24/2023 1605   CREATININE 0.65 03/24/2023 1605   CALCIUM 8.9 03/24/2023 1605   PROT 6.1 (L) 03/22/2023 1757   ALBUMIN 3.9 03/22/2023 1757   AST 19 03/22/2023 1757   ALT 18 03/22/2023 1757   ALKPHOS 56 03/22/2023 1757   BILITOT 1.0 03/22/2023 1757   GFRNONAA >60 03/24/2023 1605   GFRAA >90 11/02/2013 1052    Lab Results  Component Value Date   TSH 2.778 03/22/2023       ASSESSMENT AND PLAN  Cervical spinal stenosis - Plan: MR CERVICAL SPINE WO CONTRAST, CANCELED: MR CERVICAL SPINE WO CONTRAST  Left foot drop - Plan: MR CERVICAL SPINE WO CONTRAST, CANCELED: MR CERVICAL SPINE WO CONTRAST  Degeneration of intervertebral disc of lumbar region, unspecified whether pain present  S/P cervical spinal fusion  In summary, Mr. Gradillas is a 78 year old man with known cervical spinal stenosis (status post C3-C7 fusion) and significant lumbar degenerative changes who has had intermittent back pain and initially a right foot drop that has since improved and now a left foot drop.  Additionally, he reports some increase in urinary frequency and has had some fecal incontinence over the last year.  Physical examination shows mild weakness associated with the left foot drop in L5 innervated muscles of the left foot, very slight asymmetry of muscle bulk in the lower leg and increased reflexes in the left leg relative to the right.  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   This is most likely the explanation of his symptoms.  He has a upcoming lumbar CT myelogram and this can further complement the MRI from June 2024     although this is the most likely explanation, I have some concern that the left leg has higher reflexes in the right leg (with pathologic spread at the knee) and additionally he has had some fecal incontinence and increased urinary urgency.  Therefore, I will check an MRI of the cervical spine.  Alternatively, if you prefer, consider extending the lumbar CT myelogram to the cervical region.  We also discussed that if symptoms worsen I would want to check an NCV/EMG of the left leg and,  if further weight loss is occurring an additional limb (to assess for motor neuron disease) otherwise, he will return to see me as needed based on new symptoms and results of the studies.  Thank you for asking me to see this patient.  Please let me know if I can be of further assistance with him or other patients in the future.  I did not schedule follow-up but will see him back based on the results of the study or if he has new or worsening neurologic symptoms.Pearletha Furl. Epimenio Foot, MD, [facility] 11/10/2023, 4:45 PM Certified in Neurology, Clinical Neurophysiology, Sleep Medicine and Neuroimaging  [facility] [address] [place], [place] [address] [phone]

## 2023-11-10 ENCOUNTER — Encounter: Payer: Self-pay | Admitting: Neurology

## 2023-11-10 ENCOUNTER — Ambulatory Visit: Payer: Medicare PPO | Admitting: Neurology

## 2023-11-10 VITALS — BP 108/64 | HR 63 | Ht 69.0 in | Wt 135.0 lb

## 2023-11-10 DIAGNOSIS — M4802 Spinal stenosis, cervical region: Secondary | ICD-10-CM | POA: Diagnosis not present

## 2023-11-10 DIAGNOSIS — Z981 Arthrodesis status: Secondary | ICD-10-CM

## 2023-11-10 DIAGNOSIS — M21372 Foot drop, left foot: Secondary | ICD-10-CM | POA: Diagnosis not present

## 2023-11-10 DIAGNOSIS — M51369 Other intervertebral disc degeneration, lumbar region without mention of lumbar back pain or lower extremity pain: Secondary | ICD-10-CM | POA: Insufficient documentation

## 2023-11-10 MED ORDER — ALPRAZOLAM 0.5 MG PO TABS: ORAL_TABLET | ORAL | 0 refills | Status: AC

## 2023-11-11 ENCOUNTER — Other Ambulatory Visit: Payer: Self-pay | Admitting: Neurology

## 2023-11-11 ENCOUNTER — Telehealth: Payer: Self-pay | Admitting: Neurology

## 2023-11-11 DIAGNOSIS — R269 Unspecified abnormalities of gait and mobility: Secondary | ICD-10-CM

## 2023-11-11 DIAGNOSIS — M4802 Spinal stenosis, cervical region: Secondary | ICD-10-CM

## 2023-11-11 DIAGNOSIS — M48061 Spinal stenosis, lumbar region without neurogenic claudication: Secondary | ICD-10-CM

## 2023-11-11 DIAGNOSIS — Z981 Arthrodesis status: Secondary | ICD-10-CM

## 2023-11-11 NOTE — Telephone Encounter (Signed)
Tori- I just called pt to let him know Dr. Epimenio Foot adjusted orders to add on what was being asked. I told him once everything approved, he will be followed up with to make sure he's scheduled correctly.

## 2023-11-11 NOTE — Telephone Encounter (Signed)
Noted  

## 2023-11-11 NOTE — Telephone Encounter (Signed)
I talked to Peter Drake at GI, she said: ok let me see if I can add them on I will have to get them authorized and I may have to move his appointment.

## 2023-11-11 NOTE — Telephone Encounter (Signed)
Pt states his Dr [facility] [phone]) wants to know if Dr Epimenio Foot will contact pt's insurance and ask that cervical be added to pt's mylogram so he can have a complete mylogram instead of the MRI Dr Epimenio Foot initially wanted to order, please advise.

## 2023-11-16 ENCOUNTER — Other Ambulatory Visit: Payer: Self-pay | Admitting: Neurology

## 2023-11-16 ENCOUNTER — Encounter: Payer: Self-pay | Admitting: Neurology

## 2023-11-16 DIAGNOSIS — R29898 Other symptoms and signs involving the musculoskeletal system: Secondary | ICD-10-CM

## 2023-11-16 DIAGNOSIS — M21372 Foot drop, left foot: Secondary | ICD-10-CM

## 2023-11-16 NOTE — Telephone Encounter (Signed)
Sent message to Angel/Jillian to contact pt to schedule EMG/NCS.

## 2023-11-17 ENCOUNTER — Telehealth: Payer: Self-pay | Admitting: Neurology

## 2023-11-17 NOTE — Telephone Encounter (Signed)
Peter Drake: [phone] exp. 11/17/23-01/16/24 sent to GI [phone]

## 2023-11-17 NOTE — Telephone Encounter (Signed)
Pt scheduled for Nerve Conduction Study for 12/24/23 at 8:30am and appointment was added to both Dr. Lucia Gaskins and Dr. Zannie Cove wait lists

## 2023-11-19 ENCOUNTER — Other Ambulatory Visit: Payer: Medicare PPO

## 2023-11-19 NOTE — Telephone Encounter (Signed)
Dr. Epimenio Foot- does he still need MRI cervical?

## 2023-11-19 NOTE — Telephone Encounter (Signed)
GI called the patient to schedule the MRI and the patient wasn't sure if he still needed the MRI since he is getting the myelogram. Please let him know and if he needs the MRI still he can call GI back to schedule [phone]

## 2023-11-24 NOTE — Discharge Instructions (Signed)

## 2023-11-24 NOTE — Telephone Encounter (Signed)
I cancelled the MRI cervical order.

## 2023-11-25 ENCOUNTER — Ambulatory Visit
Admission: RE | Admit: 2023-11-25 | Discharge: 2023-11-25 | Disposition: A | Discharge status: Home / Self Care | Payer: Medicare PPO | Source: Ambulatory Visit | Attending: Neurology | Admitting: Neurology

## 2023-11-25 ENCOUNTER — Ambulatory Visit
Admission: RE | Admit: 2023-11-25 | Discharge: 2023-11-25 | Disposition: A | Discharge status: Home / Self Care | Payer: Medicare PPO | Source: Ambulatory Visit | Attending: Neurological Surgery | Admitting: Neurological Surgery

## 2023-11-25 DIAGNOSIS — Z981 Arthrodesis status: Secondary | ICD-10-CM

## 2023-11-25 DIAGNOSIS — M4802 Spinal stenosis, cervical region: Secondary | ICD-10-CM

## 2023-11-25 DIAGNOSIS — R269 Unspecified abnormalities of gait and mobility: Secondary | ICD-10-CM

## 2023-11-25 DIAGNOSIS — M48061 Spinal stenosis, lumbar region without neurogenic claudication: Secondary | ICD-10-CM

## 2023-11-25 DIAGNOSIS — M21372 Foot drop, left foot: Secondary | ICD-10-CM

## 2023-11-25 MED ORDER — MEPERIDINE HCL 50 MG/ML IJ SOLN: 50.0000 mg | Freq: Once | INTRAMUSCULAR | Status: DC | PRN

## 2023-11-25 MED ORDER — IOPAMIDOL (ISOVUE-M 300) INJECTION 61%
10.0000 mL | Freq: Once | INTRAMUSCULAR | Status: AC
  Administered 2023-11-25: 10 mL via INTRATHECAL

## 2023-11-25 MED ORDER — DIAZEPAM 5 MG PO TABS
5.0000 mg | ORAL_TABLET | Freq: Once | ORAL | Status: AC
  Administered 2023-11-25: 5 mg via ORAL

## 2023-11-25 MED ORDER — ONDANSETRON HCL 4 MG/2ML IJ SOLN: 4.0000 mg | Freq: Once | INTRAMUSCULAR | Status: DC | PRN

## 2023-11-26 ENCOUNTER — Encounter: Payer: Self-pay | Admitting: Neurology

## 2023-12-24 ENCOUNTER — Ambulatory Visit: Payer: Medicare PPO | Admitting: Neurology

## 2023-12-24 ENCOUNTER — Ambulatory Visit (INDEPENDENT_AMBULATORY_CARE_PROVIDER_SITE_OTHER): Payer: Medicare PPO | Admitting: Neurology

## 2023-12-24 DIAGNOSIS — Z981 Arthrodesis status: Secondary | ICD-10-CM

## 2023-12-24 DIAGNOSIS — M4802 Spinal stenosis, cervical region: Secondary | ICD-10-CM

## 2023-12-24 DIAGNOSIS — M21372 Foot drop, left foot: Secondary | ICD-10-CM

## 2023-12-24 DIAGNOSIS — R29898 Other symptoms and signs involving the musculoskeletal system: Secondary | ICD-10-CM

## 2023-12-24 DIAGNOSIS — M48061 Spinal stenosis, lumbar region without neurogenic claudication: Secondary | ICD-10-CM

## 2023-12-24 DIAGNOSIS — M51369 Other intervertebral disc degeneration, lumbar region without mention of lumbar back pain or lower extremity pain: Secondary | ICD-10-CM

## 2023-12-24 NOTE — Progress Notes (Signed)
 See procedure note.

## 2023-12-24 NOTE — Progress Notes (Unsigned)
 Full Name: Peter Drake Gender: Male MRN #: [id number] Date of Birth: 10-17-1946    Visit Date: 12/24/2023 08:17 Age: 78 Years Examining Physician: Dr. Naomie Dean Referring Physician: Dr. Despina Arias Height: 5 feet 9 inch  History: Foot drop with degenerative changes on MRI lumbar spine, muscle atrophy, weight loss. Also PMHx  C3-C6 ACDF 09/19/2022 (Dr. Yetta Barre) and he had previously had C6-C7. Evaluate for any other process ongoing such as motor neuron disease.  Summary: Nerve conduction studies were performed in the bilateral lower extremities and left upper extremity.  The left median motor nerve showed delayed distal onset latency (4.6 ms, normal less than 4.4) and reduced amplitude (3.8 mV, normal greater than 4).  The left ulnar ADM showed decreased conduction velocity (below elbow to wrist, 47 m/s, normal greater than 49) and decreased conduction velocity (above elbow to below elbow, 47 m/s, normal greater than 49).  The right peroneal EDB motor nerve showed decreased conduction velocity (fib head to ankle, 35 m/s, normal greater than 44) and decreased conduction velocity (pop fossa to fibular head, 41 m/s, normal greater than 4.4). The left peroneal EDB motor nerve showed decreased conduction velocity (fib head to ankle, 36 m/s, normal greater than 44) and decreased conduction velocity (pop fossa to fibular head, 41 m/s, normal greater than 4.4).  The right tibial motor nerve showed decreased conduction velocity (33 m/s, normal greater than 41).  The left tibial motor nerve showed decreased conduction velocity (38 m/s, normal greater than 41).  The right sural sensory conduction showed reduced amplitude (4 V, normal greater than 6). The left sural sensory conduction showed reduced amplitude (4 V, normal greater than 6).  The right superficial peroneal sensory nerve showed reduced amplitude (5 V, normal greater than 6).  The left median orthodromic digit 2 sensory nerve showed  delayed distal peak latency (4.9 ms, normal greater than 3.4).  The left ulnar orthodromic digit 5 sensory conduction showed delayed distal peak latency (4.5 ms, normal less than 3.1) and reduced amplitude (4 V, normal greater than 5).  The left tibial F wave showed delayed latency (59.4 ms, normal less than 56).  The left ulnar F wave showed delayed latency (36.1 ms, normal less than 30). All remaining nerves (as indicated in the following tables) were within normal limits.     EMG needle study was performed on the left upper and left lower extremities.  In the left extremities: The left tibialis anterior showed increased motor amplitude, prolonged motor unit duration, polyphasic motor units, and diminished motor unit recruitment.  The left tibialis posterior showed spontaneous activity,increased motor amplitude, prolonged motor unit duration, polyphasic motor units, and diminished motor unit recruitment.  The left peroneus longus showed decreased insertional activity, increased motor amplitude, prolonged motor unit duration, polyphasic motor units, and diminished motor unit recruitment.  The left gastrocnemius medial head showed spontaneous activity, CRDs, increased motor amplitude, prolonged motor unit duration, polyphasic motor units, and diminished motor unit recruitment.  The left extensor hallucis longus muscle showed spontaneous activity, increased motor amplitude, prolonged motor unit duration, polyphasic motor units, and diminished motor unit recruitment.  The left lower lumbar paraspinals showed spontaneous activity.  In the left upper extremity: The left deltoid, triceps, biceps, pronator teres and extensor digitorum communis muscles all showed increased motor amplitude, prolonged motor unit duration, polyphasic motor units, and diminished motor unit recruitment. All remaining muscles (as indicated in the following tables) were within normal limits.  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  At this time there is no suggestion of motor neuron disease or a demyelinating process.  ------------------------------- Naomie Dean, M.D.  [facility] [address], [address] [place], [place] [address] Tel: [phone] Fax: [phone]  Verbal informed consent was obtained from the patient, patient was informed of potential risk of procedure, including bruising, bleeding, hematoma formation, infection, muscle weakness, muscle pain, numbness, among others.        MNC    Nerve / Sites Muscle Latency Ref. Amplitude Ref. Rel Amp Segments Distance Velocity Ref. Area    ms ms mV mV %  cm m/s m/s mVms  L Median - APB     Wrist APB 4.6 <=4.4 3.8 >=4.0 100 Wrist - APB 7   22.7     Upper arm APB 9.9  4.0  104 Upper arm - Wrist 27 51 >=49 23.9  L Ulnar - ADM     Wrist ADM 3.3 <=3.3 7.1 >=6.0 100 Wrist - ADM 7   34.3     B.Elbow ADM 5.8  7.1  101 B.Elbow - Wrist 12 47 >=49 35.3     A.Elbow ADM 9.9  6.2  86.5 A.Elbow - B.Elbow 19 47 >=49 34.2  R Peroneal - EDB     Ankle EDB 4.8 <=6.5 2.6 >=2.0 100 Ankle - EDB 9   12.1     Fib head EDB 13.8  2.7  101 Fib head - Ankle 31 35 >=44 12.0     Pop fossa EDB 16.7  2.5  94.3 Pop fossa - Fib head 12 41 >=44 11.3         Pop fossa - Ankle      L Peroneal - EDB     Ankle EDB 5.7 <=6.5 2.0 >=2.0 100 Ankle - EDB 9   9.4     Fib head EDB 14.1  2.2  107 Fib head - Ankle 30.6 36 >=44 8.8     Pop fossa EDB 17.3  2.3  108 Pop fossa - Fib head 13 41 >=44 9.6         Pop fossa - Ankle      R Tibial - AH     Ankle AH 4.7 <=5.8 7.2 >=4.0 100 Ankle - AH 9   28.2     Pop fossa AH 17.8  2.4  33.4 Pop fossa - Ankle 43 33 >=41 9.6  L Tibial - AH     Ankle AH 5.6 <=5.8 5.8 >=4.0 100 Ankle  - AH 9   25.8     Pop fossa AH 16.5  5.0  87.6 Pop fossa - Ankle 42 38 >=41 28.0                 SNC    Nerve / Sites Rec. Site Peak Lat Ref.  Amp Ref. Segments Distance Peak Diff Ref.    ms ms V V  cm ms ms  R Sural - Ankle (Calf)     Calf Ankle 4.1 <=4.4 4 >=6 Calf - Ankle 14    L Sural - Ankle (Calf)     Calf Ankle 3.2 <=4.4 4 >=6 Calf - Ankle 14    R Superficial peroneal - Ankle     Lat leg Ankle 2.9 <=4.4  5 >=6 Lat leg - Ankle 12    L Superficial peroneal - Ankle     Lat leg Ankle 4.2 <=4.4 12 >=6 Lat leg - Ankle 14    L Median, Ulnar - Transcarpal comparison     Median Palm Wrist 3.0 <=2.2 46 >=35 Median Palm - Wrist 8       Ulnar Palm Wrist 3.0 <=2.2 13 >=12 Ulnar Palm - Wrist 8          Median Palm - Ulnar Palm  0.0 <=0.4  L Median - Orthodromic (Dig II, Mid palm)     Dig II Wrist 4.9 <=3.4 11 >=10 Dig II - Wrist 13    L Ulnar - Orthodromic, (Dig V, Mid palm)     Dig V Wrist 4.5 <=3.1 4 >=5 Dig V - Wrist 68                     F  Wave    Nerve F Lat Ref.   ms ms  R Tibial - AH 52.5 <=56.0  L Tibial - AH 59.4 <=56.0  L Ulnar - ADM 36.1 <=32.0           EMG Summary Table    Spontaneous MUAP Recruitment  Muscle IA Fib PSW Fasc Other Amp Dur. Poly Pattern  L. Vastus medialis Normal None None None _______ Normal Normal Normal Normal  L. Tibialis anterior Normal None None None _______ Increased Increased 4+ Reduced  L. Tibialis posterior Normal None 2+ None _______ Increased Increased 2+ Reduced  L. Peroneus longus Decreased None None None _______ Increased Increased 2+ Reduced  L. Gastrocnemius (Medial head) Normal None 1+ None CRDs Increased Increased 4+ Reduced  L. Extensor hallucis longus Normal None 2+ None _______ Increased Increased 4+ Reduced  L. Biceps femoris (long head) Normal None None None _______ Normal Normal Normal Normal  L. Gluteus maximus Normal None None None _______ Normal Normal Normal Normal  L. Gluteus medius Normal None None None _______ Normal  Normal Normal Normal  L. Lumbar paraspinals (low) Normal None 2+ None _______ Normal Normal Normal Normal  L. Deltoid Normal None None None _______ Increased Increased 4+ Reduced  L. Triceps brachii Normal None None None _______ Increased Increased 4+ Reduced  L. Biceps brachii Normal None None None _______ Normal Normal 1+ Reduced  L. Pronator teres Normal None None None _______ Normal Increased 4+ Reduced  L. Extensor digitorum communis Normal None None None _______ Normal Normal 1+ Normal  L. Extensor indicis proprius Normal None None None _______ Normal Normal Normal Normal  L. First dorsal interosseous Normal None None None _______ Normal Normal Normal Normal  L. Opponens pollicis Normal None None None _______ Normal Normal Normal Normal  L. Iliopsoas Normal None None None _______ Normal Normal Normal Normal  L. Cervical paraspinals (low) Normal None None None _______ Normal Normal Normal Normal  L. Thoracic paraspinals (low) Normal None None None _______ Normal Normal Normal Normal  L. Thoracic paraspinals (mid) Normal None None None _______ Normal Normal Normal Normal

## 2023-12-28 ENCOUNTER — Encounter: Payer: Self-pay | Admitting: Neurology

## 2023-12-28 NOTE — Procedures (Signed)
 Full Name: Peter Drake Gender: Male MRN #: [id number] Date of Birth: 1946-08-26    Visit Date: 12/24/2023 08:17 Age: 78 Years Examining Physician: Dr. Naomie Dean Referring Physician: Dr. Despina Arias Height: 5 feet 9 inch  History: Foot drop with degenerative changes on MRI lumbar spine, muscle atrophy, weight loss. Also PMHx  C3-C6 ACDF 09/19/2022 (Dr. Yetta Barre) and he had previously had C6-C7. Evaluate for any other process ongoing such as motor neuron disease.  Summary: Nerve conduction studies were performed in the bilateral lower extremities and left upper extremity.  The left median motor nerve showed delayed distal onset latency (4.6 ms, normal less than 4.4) and reduced amplitude (3.8 mV, normal greater than 4).  The left ulnar ADM showed decreased conduction velocity (below elbow to wrist, 47 m/s, normal greater than 49) and decreased conduction velocity (above elbow to below elbow, 47 m/s, normal greater than 49).  The right peroneal EDB motor nerve showed decreased conduction velocity (fib head to ankle, 35 m/s, normal greater than 44) and decreased conduction velocity (pop fossa to fibular head, 41 m/s, normal greater than 4.4). The left peroneal EDB motor nerve showed decreased conduction velocity (fib head to ankle, 36 m/s, normal greater than 44) and decreased conduction velocity (pop fossa to fibular head, 41 m/s, normal greater than 4.4).  The right tibial motor nerve showed decreased conduction velocity (33 m/s, normal greater than 41).  The left tibial motor nerve showed decreased conduction velocity (38 m/s, normal greater than 41).  The right sural sensory conduction showed reduced amplitude (4 V, normal greater than 6). The left sural sensory conduction showed reduced amplitude (4 V, normal greater than 6).  The right superficial peroneal sensory nerve showed reduced amplitude (5 V, normal greater than 6).  The left median orthodromic digit 2 sensory nerve showed  delayed distal peak latency (4.9 ms, normal greater than 3.4).  The left ulnar orthodromic digit 5 sensory conduction showed delayed distal peak latency (4.5 ms, normal less than 3.1) and reduced amplitude (4 V, normal greater than 5).  The left tibial F wave showed delayed latency (59.4 ms, normal less than 56).  The left ulnar F wave showed delayed latency (36.1 ms, normal less than 30). All remaining nerves (as indicated in the following tables) were within normal limits.     EMG needle study was performed on the left upper and left lower extremities.  In the left extremities: The left tibialis anterior showed increased motor amplitude, prolonged motor unit duration, polyphasic motor units, and diminished motor unit recruitment.  The left tibialis posterior showed spontaneous activity,increased motor amplitude, prolonged motor unit duration, polyphasic motor units, and diminished motor unit recruitment.  The left peroneus longus showed decreased insertional activity, increased motor amplitude, prolonged motor unit duration, polyphasic motor units, and diminished motor unit recruitment.  The left gastrocnemius medial head showed spontaneous activity, CRDs, increased motor amplitude, prolonged motor unit duration, polyphasic motor units, and diminished motor unit recruitment.  The left extensor hallucis longus muscle showed spontaneous activity, increased motor amplitude, prolonged motor unit duration, polyphasic motor units, and diminished motor unit recruitment.  The left lower lumbar paraspinals showed spontaneous activity.  In the left upper extremity: The left deltoid, triceps, biceps, pronator teres and extensor digitorum communis muscles all showed increased motor amplitude, prolonged motor unit duration, polyphasic motor units, and diminished motor unit recruitment. All remaining muscles (as indicated in the following tables) were within normal limits.  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  At this time there is no suggestion of motor neuron disease or a demyelinating process.  ------------------------------- Naomie Dean, M.D.  [facility] [address], [address] [place], [place] [address] Tel: [phone] Fax: [phone]  Verbal informed consent was obtained from the patient, patient was informed of potential risk of procedure, including bruising, bleeding, hematoma formation, infection, muscle weakness, muscle pain, numbness, among others.        MNC    Nerve / Sites Muscle Latency Ref. Amplitude Ref. Rel Amp Segments Distance Velocity Ref. Area    ms ms mV mV %  cm m/s m/s mVms  L Median - APB     Wrist APB 4.6 <=4.4 3.8 >=4.0 100 Wrist - APB 7   22.7     Upper arm APB 9.9  4.0  104 Upper arm - Wrist 27 51 >=49 23.9  L Ulnar - ADM     Wrist ADM 3.3 <=3.3 7.1 >=6.0 100 Wrist - ADM 7   34.3     B.Elbow ADM 5.8  7.1  101 B.Elbow - Wrist 12 47 >=49 35.3     A.Elbow ADM 9.9  6.2  86.5 A.Elbow - B.Elbow 19 47 >=49 34.2  R Peroneal - EDB     Ankle EDB 4.8 <=6.5 2.6 >=2.0 100 Ankle - EDB 9   12.1     Fib head EDB 13.8  2.7  101 Fib head - Ankle 31 35 >=44 12.0     Pop fossa EDB 16.7  2.5  94.3 Pop fossa - Fib head 12 41 >=44 11.3         Pop fossa - Ankle      L Peroneal - EDB     Ankle EDB 5.7 <=6.5 2.0 >=2.0 100 Ankle - EDB 9   9.4     Fib head EDB 14.1  2.2  107 Fib head - Ankle 30.6 36 >=44 8.8     Pop fossa EDB 17.3  2.3  108 Pop fossa - Fib head 13 41 >=44 9.6         Pop fossa - Ankle      R Tibial - AH     Ankle AH 4.7 <=5.8 7.2 >=4.0 100 Ankle - AH 9   28.2     Pop fossa AH 17.8  2.4  33.4 Pop fossa - Ankle 43 33 >=41 9.6  L Tibial - AH     Ankle AH 5.6 <=5.8 5.8 >=4.0 100 Ankle  - AH 9   25.8     Pop fossa AH 16.5  5.0  87.6 Pop fossa - Ankle 42 38 >=41 28.0                 SNC    Nerve / Sites Rec. Site Peak Lat Ref.  Amp Ref. Segments Distance Peak Diff Ref.    ms ms V V  cm ms ms  R Sural - Ankle (Calf)     Calf Ankle 4.1 <=4.4 4 >=6 Calf - Ankle 14    L Sural - Ankle (Calf)     Calf Ankle 3.2 <=4.4 4 >=6 Calf - Ankle 14    R Superficial peroneal - Ankle     Lat leg Ankle 2.9 <=4.4  5 >=6 Lat leg - Ankle 12    L Superficial peroneal - Ankle     Lat leg Ankle 4.2 <=4.4 12 >=6 Lat leg - Ankle 14    L Median, Ulnar - Transcarpal comparison     Median Palm Wrist 3.0 <=2.2 46 >=35 Median Palm - Wrist 8       Ulnar Palm Wrist 3.0 <=2.2 13 >=12 Ulnar Palm - Wrist 8          Median Palm - Ulnar Palm  0.0 <=0.4  L Median - Orthodromic (Dig II, Mid palm)     Dig II Wrist 4.9 <=3.4 11 >=10 Dig II - Wrist 13    L Ulnar - Orthodromic, (Dig V, Mid palm)     Dig V Wrist 4.5 <=3.1 4 >=5 Dig V - Wrist 41                     F  Wave    Nerve F Lat Ref.   ms ms  R Tibial - AH 52.5 <=56.0  L Tibial - AH 59.4 <=56.0  L Ulnar - ADM 36.1 <=32.0           EMG Summary Table    Spontaneous MUAP Recruitment  Muscle IA Fib PSW Fasc Other Amp Dur. Poly Pattern  L. Vastus medialis Normal None None None _______ Normal Normal Normal Normal  L. Tibialis anterior Normal None None None _______ Increased Increased 4+ Reduced  L. Tibialis posterior Normal None 2+ None _______ Increased Increased 2+ Reduced  L. Peroneus longus Decreased None None None _______ Increased Increased 2+ Reduced  L. Gastrocnemius (Medial head) Normal None 1+ None CRDs Increased Increased 4+ Reduced  L. Extensor hallucis longus Normal None 2+ None _______ Increased Increased 4+ Reduced  L. Biceps femoris (long head) Normal None None None _______ Normal Normal Normal Normal  L. Gluteus maximus Normal None None None _______ Normal Normal Normal Normal  L. Gluteus medius Normal None None None _______ Normal  Normal Normal Normal  L. Lumbar paraspinals (low) Normal None 2+ None _______ Normal Normal Normal Normal  L. Deltoid Normal None None None _______ Increased Increased 4+ Reduced  L. Triceps brachii Normal None None None _______ Increased Increased 4+ Reduced  L. Biceps brachii Normal None None None _______ Normal Normal 1+ Reduced  L. Pronator teres Normal None None None _______ Normal Increased 4+ Reduced  L. Extensor digitorum communis Normal None None None _______ Normal Normal 1+ Normal  L. Extensor indicis proprius Normal None None None _______ Normal Normal Normal Normal  L. First dorsal interosseous Normal None None None _______ Normal Normal Normal Normal  L. Opponens pollicis Normal None None None _______ Normal Normal Normal Normal  L. Iliopsoas Normal None None None _______ Normal Normal Normal Normal  L. Cervical paraspinals (low) Normal None None None _______ Normal Normal Normal Normal  L. Thoracic paraspinals (low) Normal None None None _______ Normal Normal Normal Normal  L. Thoracic paraspinals (mid) Normal None None None _______ Normal Normal Normal Normal

## 2024-01-05 ENCOUNTER — Other Ambulatory Visit: Payer: Self-pay | Admitting: Gastroenterology

## 2024-01-05 ENCOUNTER — Ambulatory Visit
Admission: RE | Admit: 2024-01-05 | Discharge: 2024-01-05 | Disposition: A | Discharge status: Home / Self Care | Source: Ambulatory Visit | Attending: Gastroenterology

## 2024-01-05 DIAGNOSIS — R194 Change in bowel habit: Secondary | ICD-10-CM

## 2024-01-20 IMAGING — CT CT ABD-PELV W/ CM
2 of 5 series · 15 of 46 positions shown, 17 images · IV contrast (APPLIED)
Comparison: Remote PET-CT from [id number]

CLINICAL DATA: Left lower quadrant abdominal pain.

EXAM:
CT ABDOMEN AND PELVIS WITH CONTRAST
TECHNIQUE: Multidetector CT imaging of the abdomen and pelvis was performed
using the standard protocol following bolus administration of
intravenous contrast.

[Series 2: axial st · axial · 0.85mm/px · z∈[-302,+53]mm · 12 of 83 slices shown, 14 images]
[im 6/83  soft-tissue]
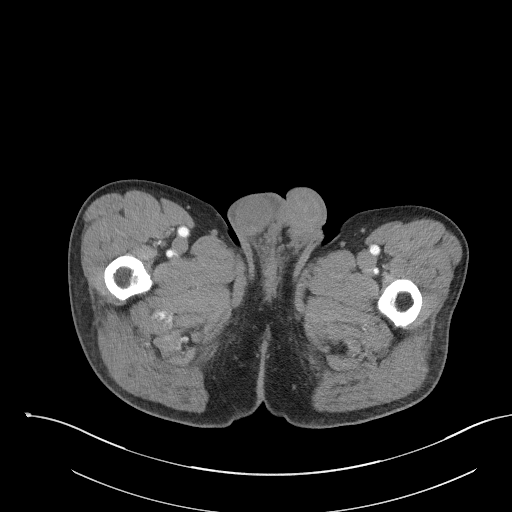
[im 6/83  bone]
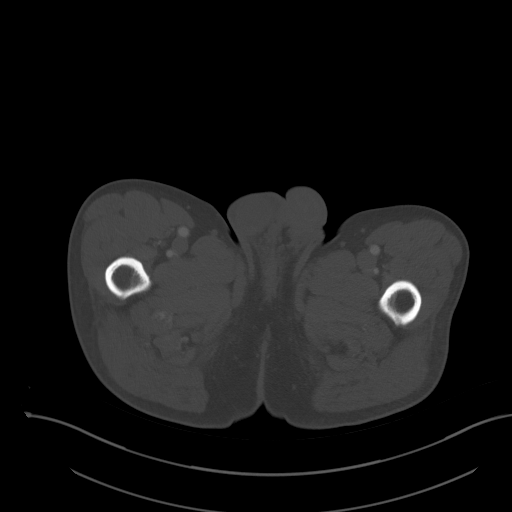
[im 12/83  soft-tissue]
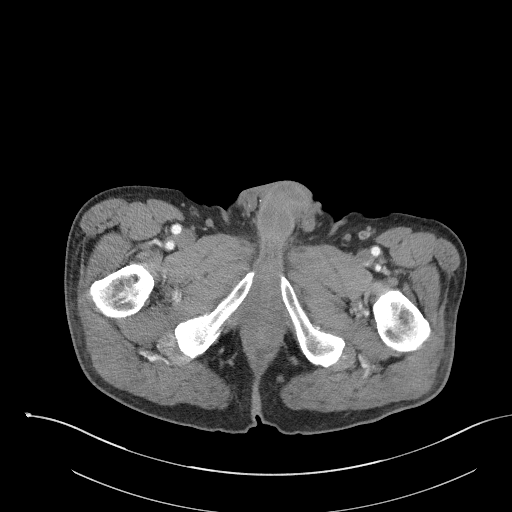
[im 18/83  soft-tissue]
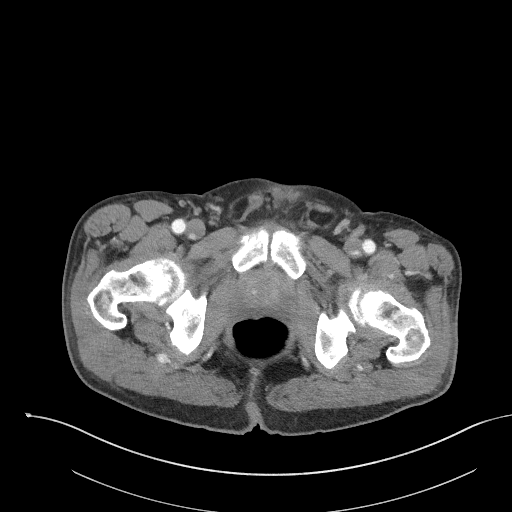
[im 24/83  soft-tissue]
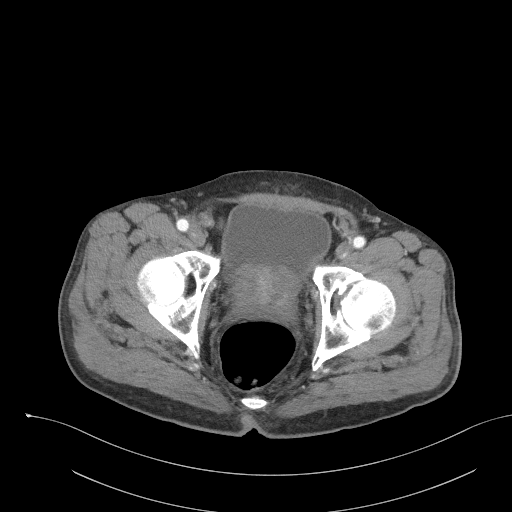
[im 30/83  soft-tissue]
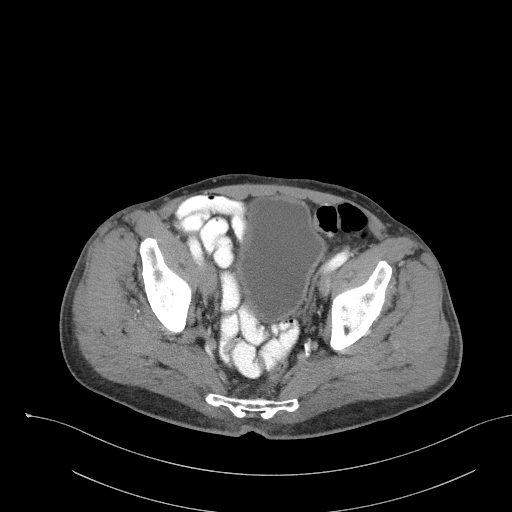
[im 36/83  soft-tissue]
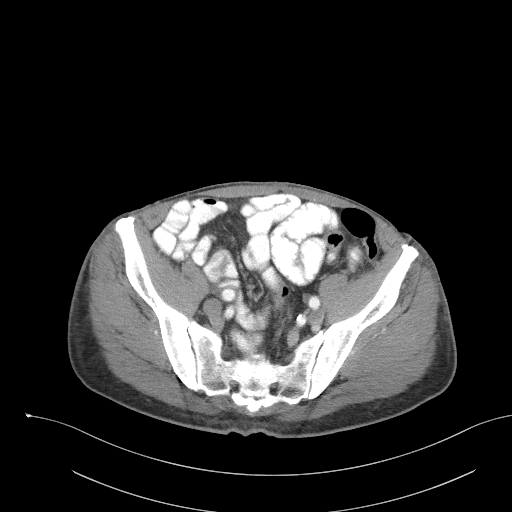
[im 47/83  soft-tissue]
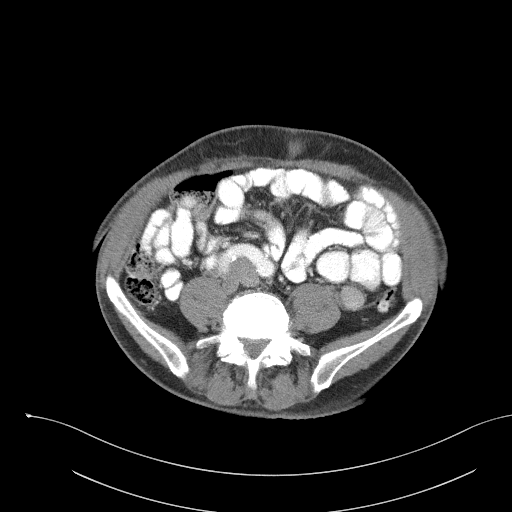
[im 53/83  soft-tissue]
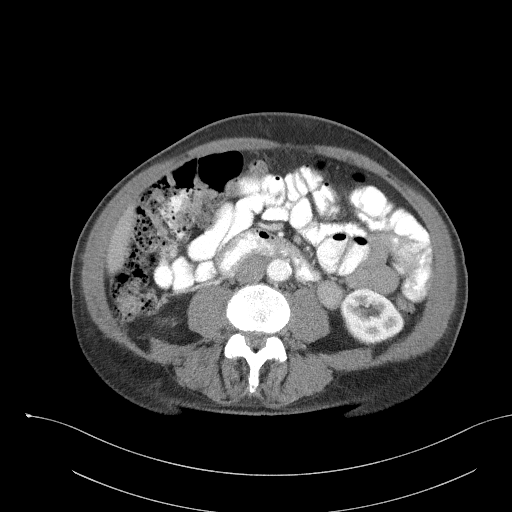
[im 59/83  soft-tissue]
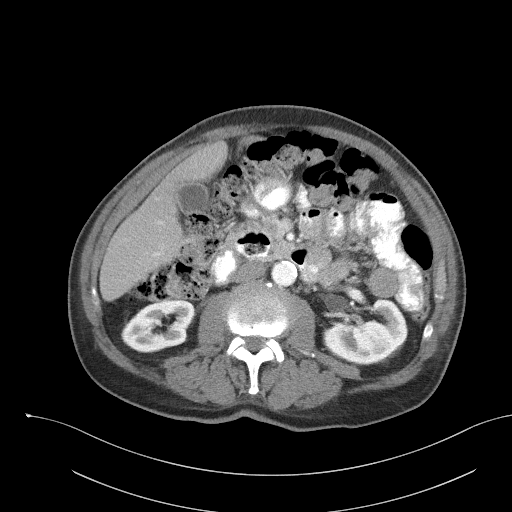
[im 59/83  bone]
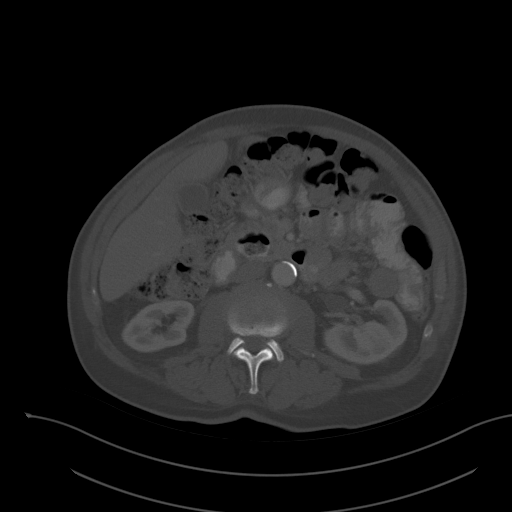
[im 65/83  soft-tissue]
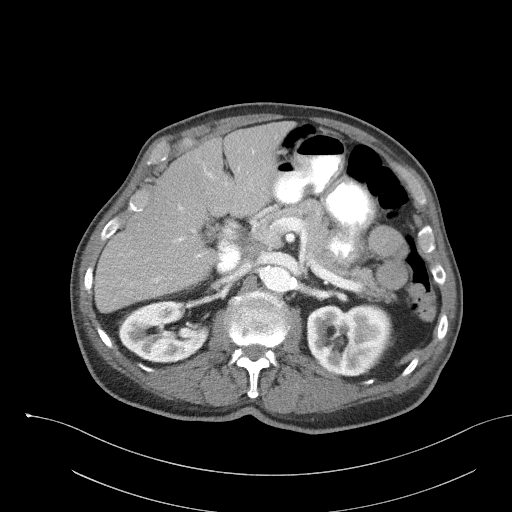
[im 71/83  soft-tissue]
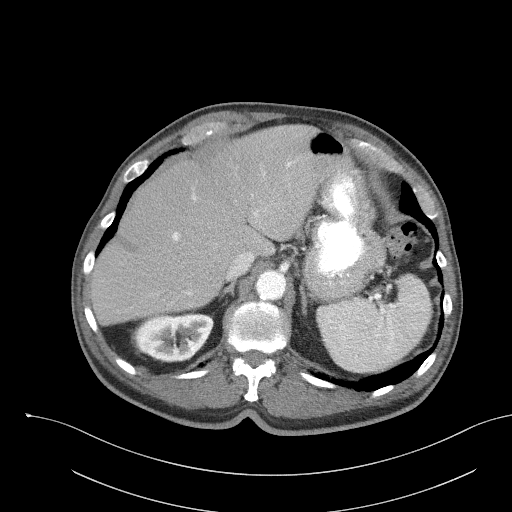
[im 77/83  soft-tissue]
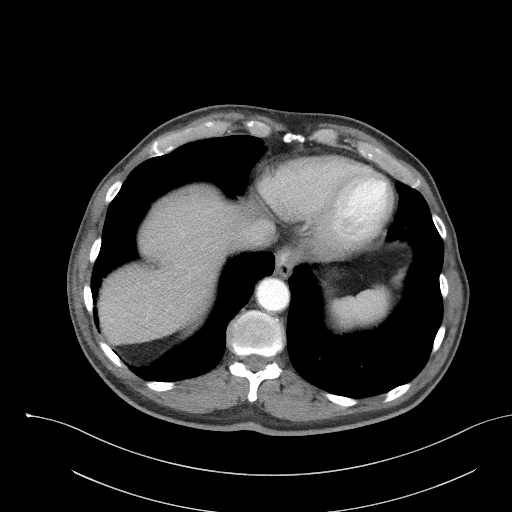

[Series 4: coronal st · coronal · 0.73mm/px · 3 of 101 slices shown]
[im 34/101  soft-tissue]
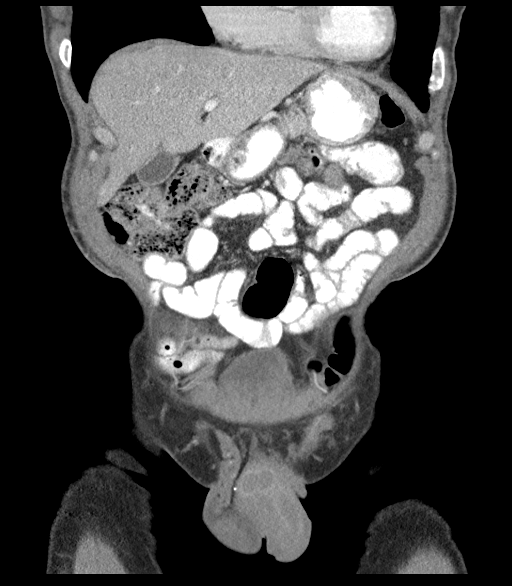
[im 45/101  soft-tissue]
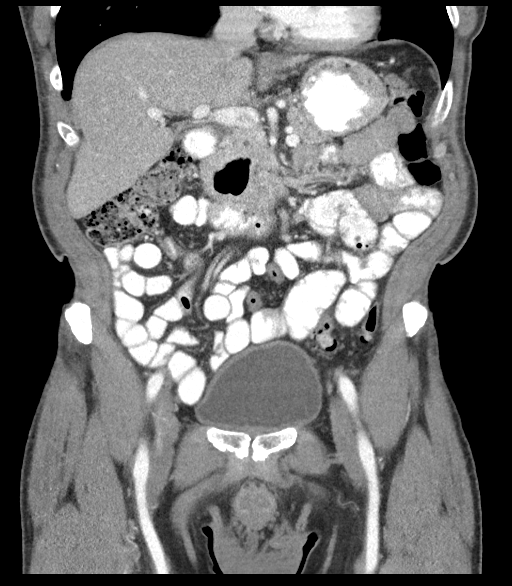
[im 56/101  soft-tissue]
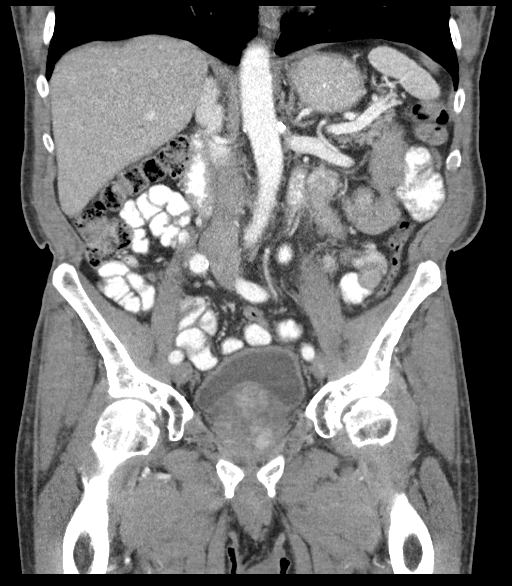

[15 of 46 positions shown; findings below may reference images not displayed]

RADIATION DOSE REDUCTION: This exam was performed according to the
departmental dose-optimization program which includes automated
exposure control, adjustment of the mA and/or kV according to
patient size and/or use of iterative reconstruction technique.

CONTRAST:  100mL OMNIPAQUE IOHEXOL 300 MG/ML  SOLN
FINDINGS: Lower chest: The lung bases are clear of acute process. No pleural
effusion or pulmonary lesions. The heart is normal in size. No
pericardial effusion. The distal esophagus and aorta are
unremarkable.

Hepatobiliary: No hepatic lesions or intrahepatic biliary
dilatation. The gallbladder is unremarkable. No common bile
dilatation.

Pancreas: No mass, inflammation or ductal dilatation.

Spleen: Normal size. No focal lesions.

Adrenals/Urinary Tract: The adrenal glands are normal.

No renal lesions, renal calculi or hydronephrosis. The delayed
images do not demonstrate any significant collecting system
abnormalities. The bladder is unremarkable. No bladder lesions or
bladder wall thickening.

Stomach/Bowel: Stomach, duodenum, small and colon are unremarkable.
No acute inflammatory changes, mass lesions obstructive findings.
The terminal ileum is normal. The appendix is normal. There is a
large duodenal diverticulum projecting off the second portion of the
duodenum. There is also a smaller diverticulum projecting off the
third portion of the duodenum. Descending and sigmoid colon
diverticulosis without findings for acute diverticulitis.

Vascular/Lymphatic: Moderate atherosclerotic calcifications
involving the aorta and branch vessel ostia but no aneurysm or
dissection. The major venous structures are patent. No mesenteric or
retroperitoneal mass or adenopathy.

Reproductive: Enlarged prostate gland with median lobe hypertrophy
impressing on the base of the bladder. The seminal vesicles are
grossly.

Other: No pelvic mass or adenopathy. No free pelvic fluid
collections. No inguinal mass or adenopathy. No abdominal wall
hernia or subcutaneous lesions.

Musculoskeletal: No acute bony findings worrisome bone lesions.
Advanced degenerative disc disease and facet disease noted at L4-5
and L5-S1. There are also moderate bilateral hip joint degenerative
changes.
IMPRESSION: 1. No acute abdominal/pelvic findings, mass lesions or adenopathy.
2. Descending and sigmoid colon diverticulosis without findings for
acute diverticulitis.
3. Enlarged prostate gland with median lobe hypertrophy impressing
on the base of the bladder.

Aortic Atherosclerosis ([id number]).

## 2024-11-04 ENCOUNTER — Encounter: Payer: Self-pay | Admitting: Neurological Surgery
